# Patient Record
Sex: Female | Born: 1960 | Race: White | Hispanic: No | Marital: Married | Smoking: Never smoker
Health system: Southern US, Community
[De-identification: ages and names within clinical notes are randomized; demographics above are authoritative.]

## PROBLEM LIST (undated history)

## (undated) DIAGNOSIS — R42 Dizziness and giddiness: Secondary | ICD-10-CM

## (undated) DIAGNOSIS — F419 Anxiety disorder, unspecified: Secondary | ICD-10-CM

## (undated) DIAGNOSIS — E119 Type 2 diabetes mellitus without complications: Secondary | ICD-10-CM

## (undated) DIAGNOSIS — E785 Hyperlipidemia, unspecified: Secondary | ICD-10-CM

## (undated) DIAGNOSIS — T7840XA Allergy, unspecified, initial encounter: Secondary | ICD-10-CM

## (undated) DIAGNOSIS — F329 Major depressive disorder, single episode, unspecified: Secondary | ICD-10-CM

## (undated) DIAGNOSIS — F32A Depression, unspecified: Secondary | ICD-10-CM

## (undated) DIAGNOSIS — I10 Essential (primary) hypertension: Secondary | ICD-10-CM

## (undated) DIAGNOSIS — J309 Allergic rhinitis, unspecified: Secondary | ICD-10-CM

## (undated) DIAGNOSIS — L259 Unspecified contact dermatitis, unspecified cause: Secondary | ICD-10-CM

## (undated) DIAGNOSIS — Z0000 Encounter for general adult medical examination without abnormal findings: Secondary | ICD-10-CM

## (undated) DIAGNOSIS — G4730 Sleep apnea, unspecified: Secondary | ICD-10-CM

## (undated) DIAGNOSIS — M722 Plantar fascial fibromatosis: Secondary | ICD-10-CM

## (undated) HISTORY — DX: Dizziness and giddiness: R42

## (undated) HISTORY — DX: Anxiety disorder, unspecified: F41.9

## (undated) HISTORY — DX: Depression, unspecified: F32.A

## (undated) HISTORY — DX: Encounter for general adult medical examination without abnormal findings: Z00.00

## (undated) HISTORY — DX: Type 2 diabetes mellitus without complications: E11.9

## (undated) HISTORY — DX: Essential (primary) hypertension: I10

## (undated) HISTORY — PX: MOUTH SURGERY: SHX715

## (undated) HISTORY — PX: TUBAL LIGATION: SHX77

## (undated) HISTORY — DX: Allergy, unspecified, initial encounter: T78.40XA

## (undated) HISTORY — DX: Hyperlipidemia, unspecified: E78.5

## (undated) HISTORY — DX: Allergic rhinitis, unspecified: J30.9

## (undated) HISTORY — DX: Plantar fascial fibromatosis: M72.2

## (undated) HISTORY — DX: Sleep apnea, unspecified: G47.30

## (undated) HISTORY — DX: Unspecified contact dermatitis, unspecified cause: L25.9

## (undated) HISTORY — PX: WISDOM TOOTH EXTRACTION: SHX21

---
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
## 1999-09-04 ENCOUNTER — Encounter (INDEPENDENT_AMBULATORY_CARE_PROVIDER_SITE_OTHER): Payer: Self-pay | Admitting: Specialist

## 1999-09-04 ENCOUNTER — Other Ambulatory Visit: Admission: RE | Admit: 1999-09-04 | Discharge: 1999-09-04 | Payer: Self-pay | Admitting: Gynecology

## 1999-09-06 ENCOUNTER — Ambulatory Visit (HOSPITAL_COMMUNITY): Admission: RE | Admit: 1999-09-06 | Discharge: 1999-09-06 | Payer: Self-pay | Admitting: Gynecology

## 1999-09-06 ENCOUNTER — Encounter (INDEPENDENT_AMBULATORY_CARE_PROVIDER_SITE_OTHER): Payer: Self-pay

## 2003-09-09 ENCOUNTER — Other Ambulatory Visit: Admission: RE | Admit: 2003-09-09 | Discharge: 2003-09-09 | Payer: Self-pay | Admitting: Gynecology

## 2003-09-28 ENCOUNTER — Encounter: Admission: RE | Admit: 2003-09-28 | Discharge: 2003-12-27 | Payer: Self-pay | Admitting: Internal Medicine

## 2004-05-13 ENCOUNTER — Ambulatory Visit: Payer: Self-pay | Admitting: Family Medicine

## 2005-01-19 ENCOUNTER — Other Ambulatory Visit: Admission: RE | Admit: 2005-01-19 | Discharge: 2005-01-19 | Payer: Self-pay | Admitting: Gynecology

## 2005-06-19 ENCOUNTER — Ambulatory Visit: Payer: Self-pay | Admitting: Internal Medicine

## 2005-11-19 ENCOUNTER — Ambulatory Visit: Payer: Self-pay | Admitting: Internal Medicine

## 2006-01-22 ENCOUNTER — Other Ambulatory Visit: Admission: RE | Admit: 2006-01-22 | Discharge: 2006-01-22 | Payer: Self-pay | Admitting: Gynecology

## 2006-02-28 ENCOUNTER — Ambulatory Visit: Payer: Self-pay | Admitting: Endocrinology

## 2006-10-03 ENCOUNTER — Encounter: Payer: Self-pay | Admitting: *Deleted

## 2006-11-29 ENCOUNTER — Telehealth (INDEPENDENT_AMBULATORY_CARE_PROVIDER_SITE_OTHER): Payer: Self-pay | Admitting: *Deleted

## 2006-11-29 ENCOUNTER — Ambulatory Visit: Payer: Self-pay | Admitting: Internal Medicine

## 2006-11-29 DIAGNOSIS — E119 Type 2 diabetes mellitus without complications: Secondary | ICD-10-CM

## 2006-11-29 HISTORY — DX: Type 2 diabetes mellitus without complications: E11.9

## 2007-01-03 ENCOUNTER — Ambulatory Visit: Payer: Self-pay | Admitting: Internal Medicine

## 2007-01-04 LAB — CONVERTED CEMR LAB
AST: 23 units/L (ref 0–37)
Albumin: 3.5 g/dL (ref 3.5–5.2)
Bacteria, UA: NEGATIVE
Basophils Absolute: 0 10*3/uL (ref 0.0–0.1)
Bilirubin, Direct: 0.1 mg/dL (ref 0.0–0.3)
CO2: 25 meq/L (ref 19–32)
Chloride: 105 meq/L (ref 96–112)
Cholesterol: 143 mg/dL (ref 0–200)
Creatinine, Ser: 0.7 mg/dL (ref 0.4–1.2)
Crystals: NEGATIVE
Eosinophils Relative: 3.2 % (ref 0.0–5.0)
Glucose, Bld: 163 mg/dL — ABNORMAL HIGH (ref 70–99)
HCT: 38.6 % (ref 36.0–46.0)
HDL: 30.7 mg/dL — ABNORMAL LOW (ref >39.0)
Hemoglobin: 13.2 g/dL (ref 12.0–15.0)
Hgb A1c MFr Bld: 6.6 % — ABNORMAL HIGH (ref 4.6–6.0)
Ketones, ur: NEGATIVE mg/dL
Leukocytes, UA: NEGATIVE
MCHC: 34.1 g/dL (ref 30.0–36.0)
Monocytes Absolute: 0.3 10*3/uL (ref 0.2–0.7)
Mucus, UA: NEGATIVE
Neutrophils Relative %: 61.6 % (ref 43.0–77.0)
Nitrite: NEGATIVE
Potassium: 3.9 meq/L (ref 3.5–5.1)
RBC: 4.45 M/uL (ref 3.87–5.11)
RDW: 12.1 % (ref 11.5–14.6)
Sodium: 139 meq/L (ref 135–145)
TSH: 1.37 microintl units/mL (ref 0.35–5.50)
Total Bilirubin: 0.4 mg/dL (ref 0.3–1.2)
Total Protein: 6.9 g/dL (ref 6.0–8.3)
Urobilinogen, UA: 0.2 (ref 0.0–1.0)
VLDL: 23 mg/dL (ref 0–40)
WBC: 6.4 10*3/uL (ref 4.5–10.5)
pH: 5 (ref 5.0–8.0)

## 2007-01-07 ENCOUNTER — Ambulatory Visit: Payer: Self-pay | Admitting: Internal Medicine

## 2007-01-07 DIAGNOSIS — E785 Hyperlipidemia, unspecified: Secondary | ICD-10-CM

## 2007-01-07 HISTORY — DX: Hyperlipidemia, unspecified: E78.5

## 2007-01-24 ENCOUNTER — Other Ambulatory Visit: Admission: RE | Admit: 2007-01-24 | Discharge: 2007-01-24 | Payer: Self-pay | Admitting: Gynecology

## 2007-01-28 ENCOUNTER — Encounter: Payer: Self-pay | Admitting: Internal Medicine

## 2007-03-24 ENCOUNTER — Telehealth: Payer: Self-pay | Admitting: Internal Medicine

## 2007-04-26 ENCOUNTER — Ambulatory Visit: Payer: Self-pay | Admitting: Family Medicine

## 2007-04-26 DIAGNOSIS — R42 Dizziness and giddiness: Secondary | ICD-10-CM

## 2007-04-26 HISTORY — DX: Dizziness and giddiness: R42

## 2007-06-24 ENCOUNTER — Telehealth: Payer: Self-pay | Admitting: Internal Medicine

## 2007-06-27 ENCOUNTER — Ambulatory Visit: Payer: Self-pay | Admitting: Internal Medicine

## 2007-06-27 LAB — CONVERTED CEMR LAB
CO2: 23 meq/L (ref 19–32)
Calcium: 9.2 mg/dL (ref 8.4–10.5)
GFR calc Af Amer: 116 mL/min
HDL: 32.2 mg/dL — ABNORMAL LOW (ref >39.0)
Hgb A1c MFr Bld: 6.3 % — ABNORMAL HIGH (ref 4.6–6.0)
Potassium: 4.1 meq/L (ref 3.5–5.1)
Sodium: 138 meq/L (ref 135–145)
Total CHOL/HDL Ratio: 4.3

## 2007-07-08 ENCOUNTER — Ambulatory Visit: Payer: Self-pay | Admitting: Internal Medicine

## 2007-07-08 DIAGNOSIS — I10 Essential (primary) hypertension: Secondary | ICD-10-CM

## 2007-07-08 HISTORY — DX: Essential (primary) hypertension: I10

## 2007-08-05 ENCOUNTER — Ambulatory Visit: Payer: Self-pay | Admitting: Internal Medicine

## 2007-08-05 DIAGNOSIS — L989 Disorder of the skin and subcutaneous tissue, unspecified: Secondary | ICD-10-CM | POA: Insufficient documentation

## 2007-08-05 DIAGNOSIS — M722 Plantar fascial fibromatosis: Secondary | ICD-10-CM

## 2007-08-05 DIAGNOSIS — J019 Acute sinusitis, unspecified: Secondary | ICD-10-CM

## 2007-08-05 HISTORY — DX: Plantar fascial fibromatosis: M72.2

## 2007-09-26 ENCOUNTER — Ambulatory Visit: Payer: Self-pay | Admitting: Internal Medicine

## 2007-09-26 DIAGNOSIS — H6690 Otitis media, unspecified, unspecified ear: Secondary | ICD-10-CM | POA: Insufficient documentation

## 2007-12-30 ENCOUNTER — Ambulatory Visit: Payer: Self-pay | Admitting: Internal Medicine

## 2007-12-30 LAB — CONVERTED CEMR LAB
AST: 36 units/L (ref 0–37)
Albumin: 3.7 g/dL (ref 3.5–5.2)
BUN: 6 mg/dL (ref 6–23)
Basophils Relative: 0.4 % (ref 0.0–3.0)
Calcium: 8.9 mg/dL (ref 8.4–10.5)
Chloride: 108 meq/L (ref 96–112)
Cholesterol: 163 mg/dL (ref 0–200)
Creatinine, Ser: 0.6 mg/dL (ref 0.4–1.2)
Creatinine,U: 131.9 mg/dL
Crystals: NEGATIVE
Eosinophils Absolute: 0.2 10*3/uL (ref 0.0–0.7)
Eosinophils Relative: 2.5 % (ref 0.0–5.0)
GFR calc Af Amer: 138 mL/min
GFR calc non Af Amer: 114 mL/min
HCT: 37.8 % (ref 36.0–46.0)
Hgb A1c MFr Bld: 6.5 % — ABNORMAL HIGH (ref 4.6–6.0)
Ketones, ur: NEGATIVE mg/dL
MCHC: 34 g/dL (ref 30.0–36.0)
MCV: 88.2 fL (ref 78.0–100.0)
Monocytes Absolute: 0.3 10*3/uL (ref 0.1–1.0)
Neutro Abs: 5 10*3/uL (ref 1.4–7.7)
RBC: 4.29 M/uL (ref 3.87–5.11)
Specific Gravity, Urine: 1.025 (ref 1.000–1.03)
Total Protein, Urine: NEGATIVE mg/dL
Triglycerides: 115 mg/dL (ref 0–149)
Urine Glucose: NEGATIVE mg/dL
VLDL: 23 mg/dL (ref 0–40)
WBC: 7.6 10*3/uL (ref 4.5–10.5)

## 2008-01-06 ENCOUNTER — Ambulatory Visit: Payer: Self-pay | Admitting: Internal Medicine

## 2008-02-04 ENCOUNTER — Other Ambulatory Visit: Admission: RE | Admit: 2008-02-04 | Discharge: 2008-02-04 | Payer: Self-pay | Admitting: Gynecology

## 2008-02-04 ENCOUNTER — Ambulatory Visit: Payer: Self-pay | Admitting: Gynecology

## 2008-02-04 ENCOUNTER — Encounter: Payer: Self-pay | Admitting: Gynecology

## 2008-02-05 ENCOUNTER — Encounter: Payer: Self-pay | Admitting: Internal Medicine

## 2008-06-30 ENCOUNTER — Ambulatory Visit: Payer: Self-pay | Admitting: Internal Medicine

## 2008-06-30 LAB — CONVERTED CEMR LAB
BUN: 8 mg/dL (ref 6–23)
CO2: 26 meq/L (ref 19–32)
Chloride: 106 meq/L (ref 96–112)
Creatinine, Ser: 0.6 mg/dL (ref 0.4–1.2)
Glucose, Bld: 167 mg/dL — ABNORMAL HIGH (ref 70–99)
LDL Cholesterol: 66 mg/dL (ref 0–99)
Total CHOL/HDL Ratio: 3

## 2008-07-05 ENCOUNTER — Telehealth: Payer: Self-pay | Admitting: Internal Medicine

## 2008-07-07 ENCOUNTER — Ambulatory Visit: Payer: Self-pay | Admitting: Internal Medicine

## 2008-07-07 DIAGNOSIS — J309 Allergic rhinitis, unspecified: Secondary | ICD-10-CM | POA: Insufficient documentation

## 2008-07-07 HISTORY — DX: Allergic rhinitis, unspecified: J30.9

## 2008-08-27 ENCOUNTER — Encounter: Payer: Self-pay | Admitting: Internal Medicine

## 2008-09-21 ENCOUNTER — Ambulatory Visit: Payer: Self-pay | Admitting: Internal Medicine

## 2008-09-21 DIAGNOSIS — L03319 Cellulitis of trunk, unspecified: Secondary | ICD-10-CM

## 2008-09-21 DIAGNOSIS — J069 Acute upper respiratory infection, unspecified: Secondary | ICD-10-CM | POA: Insufficient documentation

## 2008-09-21 DIAGNOSIS — L02219 Cutaneous abscess of trunk, unspecified: Secondary | ICD-10-CM

## 2008-12-30 ENCOUNTER — Ambulatory Visit: Payer: Self-pay | Admitting: Internal Medicine

## 2008-12-30 LAB — CONVERTED CEMR LAB
ALT: 67 units/L — ABNORMAL HIGH (ref 0–35)
AST: 54 units/L — ABNORMAL HIGH (ref 0–37)
Alkaline Phosphatase: 52 units/L (ref 39–117)
Bilirubin Urine: NEGATIVE
Calcium: 9.2 mg/dL (ref 8.4–10.5)
Eosinophils Relative: 2.3 % (ref 0.0–5.0)
GFR calc non Af Amer: 95.05 mL/min (ref >60)
HCT: 38.7 % (ref 36.0–46.0)
HDL: 37.5 mg/dL — ABNORMAL LOW (ref >39.00)
Hemoglobin, Urine: NEGATIVE
Hemoglobin: 13.1 g/dL (ref 12.0–15.0)
Hgb A1c MFr Bld: 7.5 % — ABNORMAL HIGH (ref 4.6–6.5)
LDL Cholesterol: 74 mg/dL (ref 0–99)
Leukocytes, UA: NEGATIVE
Lymphocytes Relative: 29.6 % (ref 12.0–46.0)
Lymphs Abs: 1.7 10*3/uL (ref 0.7–4.0)
Monocytes Relative: 5.7 % (ref 3.0–12.0)
Neutro Abs: 3.6 10*3/uL (ref 1.4–7.7)
Nitrite: NEGATIVE
Platelets: 237 10*3/uL (ref 150.0–400.0)
Potassium: 3.9 meq/L (ref 3.5–5.1)
Sodium: 138 meq/L (ref 135–145)
TSH: 1.1 microintl units/mL (ref 0.35–5.50)
Total Bilirubin: 0.7 mg/dL (ref 0.3–1.2)
Total CHOL/HDL Ratio: 3
Total Protein, Urine: NEGATIVE mg/dL
Urobilinogen, UA: 0.2 (ref 0.0–1.0)
VLDL: 19 mg/dL (ref 0.0–40.0)
WBC: 5.7 10*3/uL (ref 4.5–10.5)

## 2009-01-11 ENCOUNTER — Ambulatory Visit: Payer: Self-pay | Admitting: Internal Medicine

## 2009-02-07 ENCOUNTER — Encounter: Payer: Self-pay | Admitting: Internal Medicine

## 2009-02-23 ENCOUNTER — Encounter: Admission: RE | Admit: 2009-02-23 | Discharge: 2009-02-23 | Payer: Self-pay | Admitting: Internal Medicine

## 2009-02-24 ENCOUNTER — Encounter: Payer: Self-pay | Admitting: Internal Medicine

## 2009-07-04 ENCOUNTER — Ambulatory Visit: Payer: Self-pay | Admitting: Internal Medicine

## 2009-07-04 LAB — CONVERTED CEMR LAB
BUN: 7 mg/dL (ref 6–23)
CO2: 26 meq/L (ref 19–32)
Calcium: 8.8 mg/dL (ref 8.4–10.5)
Chloride: 106 meq/L (ref 96–112)
Creatinine, Ser: 0.6 mg/dL (ref 0.4–1.2)
Glucose, Bld: 168 mg/dL — ABNORMAL HIGH (ref 70–99)
Hgb A1c MFr Bld: 6.7 % — ABNORMAL HIGH (ref 4.6–6.5)
LDL Cholesterol: 69 mg/dL (ref 0–99)
Total CHOL/HDL Ratio: 4
Triglycerides: 146 mg/dL (ref 0.0–149.0)

## 2009-07-13 ENCOUNTER — Ambulatory Visit: Payer: Self-pay | Admitting: Internal Medicine

## 2009-07-13 ENCOUNTER — Telehealth: Payer: Self-pay | Admitting: Internal Medicine

## 2009-07-29 ENCOUNTER — Telehealth: Payer: Self-pay | Admitting: Internal Medicine

## 2009-11-30 ENCOUNTER — Ambulatory Visit: Payer: Self-pay | Admitting: Internal Medicine

## 2009-11-30 DIAGNOSIS — L259 Unspecified contact dermatitis, unspecified cause: Secondary | ICD-10-CM

## 2009-11-30 HISTORY — DX: Unspecified contact dermatitis, unspecified cause: L25.9

## 2010-01-31 ENCOUNTER — Ambulatory Visit
Admission: RE | Admit: 2010-01-31 | Discharge: 2010-01-31 | Payer: Self-pay | Source: Home / Self Care | Attending: Internal Medicine | Admitting: Internal Medicine

## 2010-01-31 ENCOUNTER — Other Ambulatory Visit: Payer: Self-pay | Admitting: Internal Medicine

## 2010-01-31 LAB — MICROALBUMIN / CREATININE URINE RATIO
Creatinine,U: 108 mg/dL
Microalb, Ur: 2.7 mg/dL — ABNORMAL HIGH (ref 0.0–1.9)

## 2010-01-31 LAB — URINALYSIS
Ketones, ur: NEGATIVE
Specific Gravity, Urine: 1.025 (ref 1.000–1.030)
Total Protein, Urine: NEGATIVE
Urine Glucose: NEGATIVE
pH: 5 (ref 5.0–8.0)

## 2010-01-31 LAB — HEMOGLOBIN A1C: Hgb A1c MFr Bld: 6.8 % — ABNORMAL HIGH (ref 4.6–6.5)

## 2010-01-31 LAB — CBC WITH DIFFERENTIAL/PLATELET
Basophils Absolute: 0 10*3/uL (ref 0.0–0.1)
Basophils Relative: 0.4 % (ref 0.0–3.0)
HCT: 39.9 % (ref 36.0–46.0)
Hemoglobin: 13.6 g/dL (ref 12.0–15.0)
Lymphs Abs: 1.9 10*3/uL (ref 0.7–4.0)
Monocytes Relative: 5.1 % (ref 3.0–12.0)
Neutro Abs: 4.5 10*3/uL (ref 1.4–7.7)
RDW: 14 % (ref 11.5–14.6)

## 2010-01-31 LAB — HEPATIC FUNCTION PANEL
Albumin: 3.9 g/dL (ref 3.5–5.2)
Bilirubin, Direct: 0 mg/dL (ref 0.0–0.3)
Total Protein: 6.9 g/dL (ref 6.0–8.3)

## 2010-01-31 LAB — BASIC METABOLIC PANEL
BUN: 9 mg/dL (ref 6–23)
CO2: 27 mEq/L (ref 19–32)
Calcium: 9.3 mg/dL (ref 8.4–10.5)
Creatinine, Ser: 0.6 mg/dL (ref 0.4–1.2)
Glucose, Bld: 131 mg/dL — ABNORMAL HIGH (ref 70–99)

## 2010-01-31 LAB — TSH: TSH: 1.53 u[IU]/mL (ref 0.35–5.50)

## 2010-01-31 LAB — LIPID PANEL
Cholesterol: 144 mg/dL (ref 0–200)
Triglycerides: 89 mg/dL (ref 0.0–149.0)

## 2010-02-07 ENCOUNTER — Encounter: Payer: Self-pay | Admitting: Internal Medicine

## 2010-02-07 ENCOUNTER — Ambulatory Visit
Admission: RE | Admit: 2010-02-07 | Discharge: 2010-02-07 | Payer: Self-pay | Source: Home / Self Care | Attending: Internal Medicine | Admitting: Internal Medicine

## 2010-02-09 NOTE — Progress Notes (Signed)
  Phone Note Refill Request  on July 13, 2009 5:04 PM  Refills Requested: Medication #1:  ONETOUCH ULTRA TEST   STRP use asd two times a day   Dosage confirmed as above?Dosage Confirmed   Notes: [facility] [place]  Medication #2:  Biagio Borg LANCETS   MISC 1 by mouth two times a day   Dosage confirmed as above?Dosage Confirmed   Notes: [facility] [place] Initial call taken by: Zella Ball Ewing CMA Duncan Dull),  July 13, 2009 5:04 PM    Prescriptions: ONETOUCH ULTRASOFT LANCETS   MISC (LANCETS) 1 by mouth two times a day  #200 x 11   Entered by:   Scharlene Gloss CMA (AAMA)   Authorized by:   Corwin Levins MD   Signed by:   Scharlene Gloss CMA (AAMA) on 07/13/2009   Method used:   Electronically to        [facility] [address] (retail)       [address]       [place], [place]  [address]       Ph: [phone]       Fax: [phone]   RxID:   [id number] [address] TEST   STRP (GLUCOSE BLOOD) use asd two times a day  #200 x 11   Entered by:   Zella Ball Ewing CMA (AAMA)   Authorized by:   Corwin Levins MD   Signed by:   Scharlene Gloss CMA (AAMA) on 07/13/2009   Method used:   Electronically to        [facility] [address] (retail)       [address]       [place], [place]  [address]       Ph: [phone]       Fax: [phone]   RxID:   [id number]

## 2010-02-09 NOTE — Progress Notes (Signed)
  Phone Note Refill Request Message from:  Fax from Pharmacy on July 29, 2009 8:18 AM  Refills Requested: Medication #1:  LISINOPRIL 5 MG  TABS 1 by mouth once daily   Dosage confirmed as above?Dosage Confirmed   Notes: [facility]  Medication #2:  PRAVACHOL 40 MG TABS 1 by mouth once daily   Dosage confirmed as above?Dosage Confirmed   Notes: [facility] Initial call taken by: Zella Ball Ewing CMA (AAMA),  July 29, 2009 8:18 AM    Prescriptions: PRAVACHOL 40 MG TABS (PRAVASTATIN SODIUM) 1 by mouth once daily  #90 x 3   Entered by:   Zella Ball Ewing CMA (AAMA)   Authorized by:   Corwin Levins MD   Signed by:   Scharlene Gloss CMA (AAMA) on 07/29/2009   Method used:   Faxed to ...       [facility] [address]  [phone]* (retail)       [address]       [place], [place]  [address]       Ph: [phone] or [phone]       Fax: [phone]   RxID:   [id number] LISINOPRIL 5 MG  TABS (LISINOPRIL) 1 by mouth once daily  #90 x 3   Entered by:   Scharlene Gloss CMA (AAMA)   Authorized by:   Corwin Levins MD   Signed by:   Scharlene Gloss CMA (AAMA) on 07/29/2009   Method used:   Faxed to ...       [facility] [address]  [phone]* (retail)       [address]       [place], [place]  [address]       Ph: [phone] or [phone]       Fax: [phone]   RxID:   [id number]

## 2010-02-09 NOTE — Letter (Signed)
[facility]   Imported By: Lester Sutton 02/28/2009 10:05:53  _____________________________________________________________________  External Attachment:    Type:   Image     Comment:   External Document

## 2010-02-09 NOTE — Assessment & Plan Note (Signed)
Summary: 6 MOS F/U $50 CD   Vital Signs:  Patient profile:   50 year old female Height:      62 inches Weight:      178 pounds BMI:     32.67 O2 Sat:      98 % on Room air Temp:     97.6 degrees F oral Pulse rate:   89 / minute BP sitting:   120 / 78  (left arm) Cuff size:   regular  Vitals Entered ByZella Ball Ewing (January 11, 2009 4:12 PM)  O2 Flow:  Room air  CC: 6 Mo followup/RE   CC:  6 Mo followup/RE.  History of Present Illness: here with  bilat earache , very mild with occasional vertigo wtih head movement, but no severe pain, fever, chills and ongoing for over 3 wks despite mucinex as needed;  Pt denies CP, sob, doe, wheezing, orthopnea, pnd, worsening LE edema, palps, dizziness or syncope  Pt denies new neuro symptoms such as headache, facial or extremity weakness   Pt denies polydipsia, polyuria, or low sugar symptoms such as shakiness improved with eating.  Overall good compliance with meds, trying to follow low chol, DM diet, wt stable, little excercise however   Problems Prior to Update: 1)  Preventive Health Care  (ICD-V70.0) 2)  Uri  (ICD-465.9) 3)  Cellulitis and Abscess of Trunk  (ICD-682.2) 4)  Allergic Rhinitis  (ICD-477.9) 5)  Sinusitis- Acute-nos  (ICD-461.9) 6)  Preventive Health Care  (ICD-V70.0) 7)  Intermittent Vertigo  (ICD-780.4) 8)  Otitis Media, Acute, Bilateral  (ICD-382.9) 9)  Plantar Fasciitis, Left  (ICD-728.71) 10)  Skin Lesion  (ICD-709.9) 11)  Sinusitis- Acute-nos  (ICD-461.9) 12)  Hypertension  (ICD-401.9) 13)  Vertigo  (ICD-780.4) 14)  Hyperlipidemia  (ICD-272.4) 15)  Preventive Health Care  (ICD-V70.0) 16)  Routine General Medical Exam@health  Care Fac[facility]  (ICD-V70.0) 17)  Diabetes Mellitus, Type II  (ICD-250.00)  Medications Prior to Update: 1)  Metformin Hcl 500 Mg  Tabs (Metformin Hcl) .... 2 By Mouth Bid 2)  Ecotrin Low Strength 81 Mg  Tbec (Aspirin) .Marland Kitchen.. 1 By Mouth Qd 3)  Onetouch Ultra Test   Strp (Glucose Blood) .... Use Asd  Two Times A Day 4)  Glimepiride 1 Mg  Tabs (Glimepiride) .... 1/2 By Mouth Once Daily At Noon 5)  Onetouch Ultrasoft Lancets   Misc (Lancets) .Marland Kitchen.. 1 By Mouth Two Times A Day 6)  Lisinopril 5 Mg  Tabs (Lisinopril) .Marland Kitchen.. 1 By Mouth Once Daily 7)  Pravachol 40 Mg Tabs (Pravastatin Sodium) .Marland Kitchen.. 1 By Mouth Once Daily 8)  Fluticasone Propionate 50 Mcg/act Susp (Fluticasone Propionate) .... 2 Spray/side Once Daily 9)  Doxycycline Hyclate 100 Mg Caps (Doxycycline Hyclate) .Marland Kitchen.. 1 By Mouth Two Times A Day  Current Medications (verified): 1)  Metformin Hcl 500 Mg  Tabs (Metformin Hcl) .... 2 By Mouth Two Times A Day 2)  Ecotrin Low Strength 81 Mg  Tbec (Aspirin) .Marland Kitchen.. 1 By Mouth Qd 3)  Onetouch Ultra Test   Strp (Glucose Blood) .... Use Asd Two Times A Day 4)  Glimepiride 1 Mg  Tabs (Glimepiride) .Marland Kitchen.. 1 By Mouth Once Daily At Noon 5)  Onetouch Ultrasoft Lancets   Misc (Lancets) .Marland Kitchen.. 1 By Mouth Two Times A Day 6)  Lisinopril 5 Mg  Tabs (Lisinopril) .Marland Kitchen.. 1 By Mouth Once Daily 7)  Pravachol 40 Mg Tabs (Pravastatin Sodium) .Marland Kitchen.. 1 By Mouth Once Daily 8)  Fluticasone Propionate 50 Mcg/act Susp (Fluticasone Propionate) .Marland KitchenMarland KitchenMarland Kitchen  2 Spray/side Once Daily 9)  Fexofenadine Hcl 180 Mg Tabs (Fexofenadine Hcl) .Marland Kitchen.. 1po Once Daily As Needed Allergies  Allergies (verified): 1)  ! Penicillin  Past History:  Past Medical History: Last updated: 07/07/2008 Diabetes mellitus, type II uterine polyps Hypertension Allergic rhinitis  Past Surgical History: Last updated: 11/29/2006 Tubal ligation  Family History: Last updated: 01/11/2009 mother with MI, chf, stroke, lung cancer, breast cancer father with prostate cancer, CAD, DM. stroke x 3 brother died with aneurysm  Social History: Last updated: 08/05/2007 Never Smoked Alcohol use-yes Married 2 children work - Print production planner  Risk Factors: Smoking Status: never (11/29/2006)  Family History: Reviewed history from 08/05/2007 and no changes required. mother  with MI, chf, stroke, lung cancer, breast cancer father with prostate cancer, CAD, DM. stroke x 3 brother died with aneurysm  Review of Systems  The patient denies anorexia, fever, weight loss, weight gain, vision loss, decreased hearing, hoarseness, chest pain, syncope, dyspnea on exertion, peripheral edema, prolonged cough, headaches, hemoptysis, abdominal pain, melena, hematochezia, severe indigestion/heartburn, hematuria, incontinence, muscle weakness, suspicious skin lesions, transient blindness, difficulty walking, depression, unusual weight change, abnormal bleeding, enlarged lymph nodes, and angioedema.         all otherwise negative per pt -  Physical Exam  General:  alert and overweight-appearing.   Head:  normocephalic and atraumatic.   Eyes:  vision grossly intact, pupils equal, and pupils round.   Ears:  bilat tm's mild erythema at best Nose:  no external deformity and no nasal discharge.   Mouth:  no gingival abnormalities and pharynx pink and moist.   Neck:  supple and no masses.   Lungs:  normal respiratory effort and normal breath sounds.   Heart:  normal rate and regular rhythm.   Abdomen:  soft, non-tender, and normal bowel sounds.   Msk:  no joint tenderness and no joint swelling.   Extremities:  no edema, no erythema  Neurologic:  cranial nerves II-XII intact and strength normal in all extremities.     Impression & Recommendations:  Problem # 1:  Preventive Health Care (ICD-V70.0) Overall doing well, up to date, counseled on routine health concerns for screening and prevention, immunizations up to date or declined, labs reviewed  Problem # 2:  DIABETES MELLITUS, TYPE II (ICD-250.00)  Her updated medication list for this problem includes:    Metformin Hcl 500 Mg Tabs (Metformin hcl) .Marland Kitchen... 2 by mouth two times a day    Ecotrin Low Strength 81 Mg Tbec (Aspirin) .Marland Kitchen... 1 by mouth qd    Glimepiride 1 Mg Tabs (Glimepiride) .Marland Kitchen... 1 by mouth once daily at noon     Lisinopril 5 Mg Tabs (Lisinopril) .Marland Kitchen... 1 by mouth once daily to icnrease the OHA above, refer to dietary,  Orders: Diabetic Clinic Referral (Diabetic)  Problem # 3:  ALLERGIC RHINITIS (ICD-477.9)  Her updated medication list for this problem includes:    Fluticasone Propionate 50 Mcg/act Susp (Fluticasone propionate) .Marland Kitchen... 2 spray/side once daily    Fexofenadine Hcl 180 Mg Tabs (Fexofenadine hcl) .Marland Kitchen... 1po once daily as needed allergies most likely related to the ear complaints - treat as above, f/u any worsening signs or symptoms   Complete Medication List: 1)  Metformin Hcl 500 Mg Tabs (Metformin hcl) .... 2 by mouth two times a day 2)  Ecotrin Low Strength 81 Mg Tbec (Aspirin) .Marland Kitchen.. 1 by mouth qd 3)  Onetouch Ultra Test Strp (Glucose blood) .... Use asd two times a day 4)  Glimepiride 1  Mg Tabs (Glimepiride) .Marland Kitchen.. 1 by mouth once daily at noon 5)  Onetouch Ultrasoft Lancets Misc (Lancets) .Marland Kitchen.. 1 by mouth two times a day 6)  Lisinopril 5 Mg Tabs (Lisinopril) .Marland Kitchen.. 1 by mouth once daily 7)  Pravachol 40 Mg Tabs (Pravastatin sodium) .Marland Kitchen.. 1 by mouth once daily 8)  Fluticasone Propionate 50 Mcg/act Susp (Fluticasone propionate) .... 2 spray/side once daily 9)  Fexofenadine Hcl 180 Mg Tabs (Fexofenadine hcl) .Marland Kitchen.. 1po once daily as needed allergies  Other Orders: Admin 1st Vaccine (60454) Flu Vaccine 52yrs + ([address]11)  Patient Instructions: 1)  you had the flu shot today 2)   incrsae the glimeparide to 1 mg per day 3)  start the generic allegra 1 per day 4)  Continue all previous medications as before this visit  5)  Please schedule a follow-up appointment in 6 months with : 6)  BMP prior to visit, ICD-9: 250.02 7)  Lipid Panel prior to visit, ICD-9: 8)  HbgA1C prior to visit, ICD-9: Prescriptions: PRAVACHOL 40 MG TABS (PRAVASTATIN SODIUM) 1 by mouth once daily  #90 x 3   Entered and Authorized by:   Corwin Levins MD   Signed by:   Corwin Levins MD on 01/11/2009   Method used:   Print  then Give to Patient   RxID:   [id number] LISINOPRIL 5 MG  TABS (LISINOPRIL) 1 by mouth once daily  #90 x 3   Entered and Authorized by:   Corwin Levins MD   Signed by:   Corwin Levins MD on 01/11/2009   Method used:   Print then Give to Patient   RxID:   [id number] FEXOFENADINE HCL 180 MG TABS (FEXOFENADINE HCL) 1po once daily as needed allergies  #90 x 3   Entered and Authorized by:   Corwin Levins MD   Signed by:   Corwin Levins MD on 01/11/2009   Method used:   Print then Give to Patient   RxID:   [id number] FLUTICASONE PROPIONATE 50 MCG/ACT SUSP (FLUTICASONE PROPIONATE) 2 spray/side once daily  #1 x 11   Entered and Authorized by:   Corwin Levins MD   Signed by:   Corwin Levins MD on 01/11/2009   Method used:   Print then Give to Patient   RxID:   [id number] METFORMIN HCL 500 MG  TABS (METFORMIN HCL) 2 by mouth two times a day  #120 x 11   Entered and Authorized by:   Corwin Levins MD   Signed by:   Corwin Levins MD on 01/11/2009   Method used:   Print then Give to Patient   RxID:   [id number] GLIMEPIRIDE 1 MG  TABS (GLIMEPIRIDE) 1 by mouth once daily at noon  #90 x 3   Entered and Authorized by:   Corwin Levins MD   Signed by:   Corwin Levins MD on 01/11/2009   Method used:   Print then Give to Patient   RxID:   [id number] GLIMEPIRIDE 1 MG  TABS (GLIMEPIRIDE) 1 by mouth once daily at noon  #90 x 3   Entered and Authorized by:   Corwin Levins MD   Signed by:   Corwin Levins MD on 01/11/2009   Method used:   Electronically to        [facility]  [phone]* (retail)       [address] [place]  [place], [place]  [address]       Ph: [phone] or [phone]       Fax: [phone]   RxID:   [id number]     Flu Vaccine Consent Questions     Do you have a history of severe allergic reactions to this vaccine? no    Any prior history of allergic reactions to egg and/or gelatin? no    Do you have a sensitivity to  the preservative Thimersol? no    Do you have a past history of Guillan-Barre Syndrome? no    Do you currently have an acute febrile illness? no    Have you ever had a severe reaction to latex? no    Vaccine information given and explained to patient? yes    Are you currently pregnant? no    Lot Number:[id number]   Exp Date:07/07/2009   Site Given  Left Deltoid IMflu

## 2010-02-09 NOTE — Progress Notes (Signed)
  Phone Note Refill Request Message from:  Patient on July 29, 2009 10:06 AM  Refills Requested: Medication #1:  ONETOUCH ULTRA TEST   STRP use asd two times a day   Dosage confirmed as above?Dosage Confirmed   Notes: [facility] [place]  Medication #2:  Biagio Borg LANCETS   MISC 1 by mouth two times a day   Dosage confirmed as above?Dosage Confirmed   Notes: [facility] [place] Initial call taken by: Zella Ball Ewing CMA [place]),  July 29, 2009 10:06 AM    Prescriptions: Biagio Borg LANCETS   MISC (LANCETS) 1 by mouth two times a day  #200 x 11   Entered by:   Zella Ball Ewing CMA (AAMA)   Authorized by:   Corwin Levins MD   Signed by:   Scharlene Gloss CMA (AAMA) on 07/29/2009   Method used:   Faxed to ...       [facility] (retail)       [address]       [place], [place]  [address]       Ph: [phone]       Fax: [phone]   RxID:   [id number] [address] TEST   STRP (GLUCOSE BLOOD) use asd two times a day  #200 x 11   Entered by:   Zella Ball Ewing CMA (AAMA)   Authorized by:   Corwin Levins MD   Signed by:   Scharlene Gloss CMA (AAMA) on 07/29/2009   Method used:   Faxed to ...       [facility] (retail)       [address]       [place], [place]  [address]       Ph: [phone]       Fax: [phone]   RxID:   [id number]

## 2010-02-09 NOTE — Assessment & Plan Note (Signed)
Summary: ?poison oak/#/cd   Vital Signs:  Patient profile:   50 year old female Height:      62 inches Weight:      173.38 pounds BMI:     31.83 O2 Sat:      98 % on Room air Temp:     98.7 degrees F oral Pulse rate:   78 / minute BP sitting:   100 / 60  (left arm) Cuff size:   regular  Vitals Entered By: Zella Ball Ewing CMA Duncan Dull) (November 30, 2009 9:21 AM)  O2 Flow:  Room air CC: poison oak/RE   CC:  poison oak/RE.  History of Present Illness: here with acute c/o rash to the RLQ abd/side with marked itching and hard to stop scratching, but no fever, other rash, chills or swelling  Seemed to start x 3 days after working in the yard and may have gotten some plant material to touch her side as she reached up high to work adn shirt tail rasied up.  Pt denies CP, worsening sob, doe, wheezing, orthopnea, pnd, worsening LE edema, palps, dizziness or syncope  Pt denies new neuro symptoms such as headache, facial or extremity weakness  Pt denies polydipsia, polyuria, or low sugar symptoms such as shakiness improved with eating.  Overall good compliance with meds, trying to follow low chol, DM diet, wt stable, little excercise however Overall good compliance with meds, and good tolerability.   Preventive Screening-Counseling & Management      Drug Use:  no.    Problems Prior to Update: 1)  Contact Dermatitis  (ICD-692.9) 2)  Preventive Health Care  (ICD-V70.0) 3)  Uri  (ICD-465.9) 4)  Cellulitis and Abscess of Trunk  (ICD-682.2) 5)  Allergic Rhinitis  (ICD-477.9) 6)  Sinusitis- Acute-nos  (ICD-461.9) 7)  Preventive Health Care  (ICD-V70.0) 8)  Intermittent Vertigo  (ICD-780.4) 9)  Otitis Media, Acute, Bilateral  (ICD-382.9) 10)  Plantar Fasciitis, Left  (ICD-728.71) 11)  Skin Lesion  (ICD-709.9) 12)  Sinusitis- Acute-nos  (ICD-461.9) 13)  Hypertension  (ICD-401.9) 14)  Vertigo  (ICD-780.4) 15)  Hyperlipidemia  (ICD-272.4) 16)  Preventive Health Care  (ICD-V70.0) 17)  Routine  General Medical Exam@[facility]  (ICD-V70.0) 18)  Diabetes Mellitus, Type II  (ICD-250.00)  Medications Prior to Update: 1)  Metformin Hcl 1000 Mg Tabs (Metformin Hcl) .Marland Kitchen.. 1po Two Times A Day 2)  Ecotrin Low Strength 81 Mg  Tbec (Aspirin) .Marland Kitchen.. 1 By Mouth Qd 3)  Onetouch Ultra Test   Strp (Glucose Blood) .... Use Asd Two Times A Day 4)  Glimepiride 1 Mg  Tabs (Glimepiride) .... 1/2 By Mouth Two Times A Day (Noon and Later With Evening Meal) 5)  Onetouch Ultrasoft Lancets   Misc (Lancets) .Marland Kitchen.. 1 By Mouth Two Times A Day 6)  Lisinopril 5 Mg  Tabs (Lisinopril) .Marland Kitchen.. 1 By Mouth Once Daily 7)  Pravachol 40 Mg Tabs (Pravastatin Sodium) .Marland Kitchen.. 1 By Mouth Once Daily 8)  Fluticasone Propionate 50 Mcg/act Susp (Fluticasone Propionate) .... 2 Spray/side Once Daily 9)  Fexofenadine Hcl 180 Mg Tabs (Fexofenadine Hcl) .Marland Kitchen.. 1po Once Daily As Needed Allergies  Current Medications (verified): 1)  Metformin Hcl 1000 Mg Tabs (Metformin Hcl) .Marland Kitchen.. 1po Two Times A Day 2)  Ecotrin Low Strength 81 Mg  Tbec (Aspirin) .Marland Kitchen.. 1 By Mouth Qd 3)  Onetouch Ultra Test   Strp (Glucose Blood) .... Use Asd Two Times A Day 4)  Glimepiride 1 Mg  Tabs (Glimepiride) .... 1/2 By Mouth Two  Times A Day (Noon and Later With Evening Meal) 5)  Onetouch Ultrasoft Lancets   Misc (Lancets) .Marland Kitchen.. 1 By Mouth Two Times A Day 6)  Lisinopril 5 Mg  Tabs (Lisinopril) .Marland Kitchen.. 1 By Mouth Once Daily 7)  Pravachol 40 Mg Tabs (Pravastatin Sodium) .Marland Kitchen.. 1 By Mouth Once Daily 8)  Fluticasone Propionate 50 Mcg/act Susp (Fluticasone Propionate) .... 2 Spray/side Once Daily 9)  Fexofenadine Hcl 180 Mg Tabs (Fexofenadine Hcl) .Marland Kitchen.. 1po Once Daily As Needed Allergies 10)  Prednisone 10 Mg Tabs (Prednisone) .... 3po Qd For 3days, Then 2po Qd For 3days, Then 1po Qd For 3days, Then Stop 11)  Triamcinolone Acetonide 0.1 % Crea (Triamcinolone Acetonide) .... Use Asd Two Times A Day As Needed  Allergies (verified): 1)  ! 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   Head:  normocephalic and atraumatic.   Eyes:  vision grossly intact, pupils equal, and pupils round.   Ears:  R ear normal and L ear normal.   Nose:  no external deformity and no nasal discharge.   Mouth:  no gingival abnormalities and pharynx pink and moist.   Neck:  supple and no masses.   Lungs:  normal respiratory effort and normal breath sounds.   Heart:  normal rate and regular rhythm.   Extremities:  no edema, no erythema  Skin:  RLQ abd area skin wtih 4x6 cm area typical dermatitis , nontender nonvesicular without surrounding erythema, tender or red streaks   Impression & Recommendations:  Problem # 1:  CONTACT DERMATITIS (ICD-692.9)  Her updated medication list for this problem includes:    Fexofenadine Hcl 180 Mg Tabs (Fexofenadine hcl) .Marland Kitchen... 1po once daily as needed allergies    Prednisone 10 Mg Tabs (Prednisone) .Marland Kitchen... 3po qd for 3days, then 2po qd for 3days, then 1po qd for 3days, then stop    Triamcinolone Acetonide 0.1 % Crea (Triamcinolone acetonide) ..... Use asd two times a day as needed  Orders: Depo- Medrol 40mg  (J1030) Depo- Medrol 80mg  (J1040) Admin of Therapeutic Inj  intramuscular or subcutaneous (16109) treat as above, f/u any worsening signs or symptoms , for depomedrol IM and predpack for home, atarax for itching  Problem # 2:  HYPERTENSION (ICD-401.9)  Her  updated medication list for this problem includes:    Lisinopril 5 Mg Tabs (Lisinopril) .Marland Kitchen... 1 by mouth once daily  BP today: 100/60 Prior BP: 114/70 (07/13/2009)  Labs Reviewed: K+: 4.3 (07/04/2009) Creat: : 0.6 (07/04/2009)   Chol: 132 (07/04/2009)   HDL: 33.50 (07/04/2009)   LDL: 69 (07/04/2009)   TG: 146.0 (07/04/2009) stable overall by hx and exam, ok to continue meds/tx as is   Problem # 3:  DIABETES MELLITUS, TYPE II (ICD-250.00)  Her updated medication list for this problem includes:    Metformin Hcl 1000 Mg Tabs (Metformin hcl) .Marland Kitchen... 1po two times a day    Ecotrin Low Strength 81 Mg Tbec (Aspirin) .Marland Kitchen... 1 by mouth  qd    Glimepiride 1 Mg Tabs (Glimepiride) .Marland Kitchen... 1/2 by mouth two times a day (noon and later with evening meal)    Lisinopril 5 Mg Tabs (Lisinopril) .Marland Kitchen... 1 by mouth once daily  Labs Reviewed: Creat: 0.6 (07/04/2009)    Reviewed HgBA1c results: 6.7 (07/04/2009)  7.5 (12/30/2008) stable overall by hx and exam, ok to continue meds/tx as is , pt to call for onset polys or sugar > 200  Complete Medication List: 1)  Metformin Hcl 1000 Mg Tabs (Metformin hcl) .Marland Kitchen.. 1po two times a day 2)  Ecotrin Low Strength 81 Mg Tbec (Aspirin) .Marland Kitchen.. 1 by mouth qd 3)  Onetouch Ultra Test Strp (Glucose blood) .... Use asd two times a day 4)  Glimepiride 1 Mg Tabs (Glimepiride) .... 1/2 by mouth two times a day (noon and later with evening meal) 5)  Onetouch Ultrasoft Lancets Misc (Lancets) .Marland Kitchen.. 1 by mouth two times a day 6)  Lisinopril 5 Mg Tabs (Lisinopril) .Marland Kitchen.. 1 by mouth once daily 7)  Pravachol 40 Mg Tabs (Pravastatin sodium) .Marland Kitchen.. 1 by mouth once daily 8)  Fluticasone Propionate 50 Mcg/act Susp (Fluticasone propionate) .... 2 spray/side once daily 9)  Fexofenadine Hcl 180 Mg Tabs (Fexofenadine hcl) .Marland Kitchen.. 1po once daily as needed allergies 10)  Prednisone 10 Mg Tabs (Prednisone) .... 3po qd for 3days, then 2po qd for 3days, then 1po qd for 3days, then stop 11)  Triamcinolone  Acetonide 0.1 % Crea (Triamcinolone acetonide) .... Use asd two times a day as needed  Patient Instructions: 1)  Please take all new medications as prescribed 2)  you had the steroid shot today 3)  Continue all previous medications as before this visit  4)  Please schedule a follow-up appointment in 2 months with CPX  and labs and: 5)  HbgA1C prior to visit, ICD-9: 250.02 6)  Urine Microalbumin prior to visit, ICD-9: Prescriptions: TRIAMCINOLONE ACETONIDE 0.1 % CREA (TRIAMCINOLONE ACETONIDE) use asd two times a day as needed  #1large x 1   Entered and Authorized by:   Corwin Levins MD   Signed by:   Corwin Levins MD on 11/30/2009   Method used:   Print then Give to Patient   RxID:   [id number] PREDNISONE 10 MG TABS (PREDNISONE) 3po qd for 3days, then 2po qd for 3days, then 1po qd for 3days, then stop  #18 x 0   Entered and Authorized by:   Corwin Levins MD   Signed by:   Corwin Levins MD on 11/30/2009   Method used:   Print then Give to Patient   RxID:   [id number]    Medication Administration  Injection # 1:    Medication: Depo- Medrol 40mg     Diagnosis: CONTACT DERMATITIS (ICD-692.9)    Route: IM    Site: LUOQ gluteus    Exp Date: 05/2012    Lot #: [id number]    Mfr: [employer]    Comments: patient received 120mg  Depo-medrol    Patient tolerated injection without complications    Given by: Zella Ball Ewing CMA (AAMA) (November 30, 2009 10:18 AM)  Injection # 2:    Medication: Depo- Medrol 80mg     Diagnosis: CONTACT DERMATITIS (ICD-692.9)    Route: IM    Site: LUOQ gluteus    Exp Date: 05/2012    Lot #: [id number]    Mfr: [employer]    Given by: Zella Ball Ewing CMA (AAMA) (November 30, 2009 10:18 AM)  Orders Added: 1)  Depo- Medrol 40mg  [  J1030] 2)  Depo- Medrol 80mg  [J1040] 3)  Admin of Therapeutic Inj  intramuscular or subcutaneous [96372] 4)  Est. Patient Level IV [10272]

## 2010-02-09 NOTE — Assessment & Plan Note (Signed)
Summary: 6 MO ROV /NWS  #   Vital Signs:  Patient profile:   50 year old female Height:      62 inches Weight:      176.50 pounds BMI:     32.40 O2 Sat:      98 % on Room air Temp:     98.7 degrees F oral Pulse rate:   83 / minute BP sitting:   114 / 70  (left arm) Cuff size:   regular  Vitals Entered By: Zella Ball Ewing CMA (AAMA) (July 13, 2009 4:19 PM)  O2 Flow:  Room air CC: 6 Month ROV/RE   CC:  6 Month ROV/RE.  History of Present Illness: overall doing well;  Pt denies CP, sob, doe, wheezing, orthopnea, pnd, worsening LE edema, palps, dizziness or syncope  Pt denies new neuro symptoms such as headache, facial or extremity weakness  Pt denies polydipsia, polyuria, or low sugar symptoms such as shakiness improved with eating.  Overall good compliance with meds, trying to follow low chol, DM diet, wt stable, little excercise however   Does have tendency to mild AM cbgs about 150 or slightly higher.  Does also have mild nasal allergies with mostly itch and sneeze, without severe congestion or difficulty breathing;  no ST, cough or wheezing or sob.  Problems Prior to Update: 1)  Preventive Health Care  (ICD-V70.0) 2)  Uri  (ICD-465.9) 3)  Cellulitis and Abscess of Trunk  (ICD-682.2) 4)  Allergic Rhinitis  (ICD-477.9) 5)  Sinusitis- Acute-nos  (ICD-461.9) 6)  Preventive Health Care  (ICD-V70.0) 7)  Intermittent Vertigo  (ICD-780.4) 8)  Otitis Media, Acute, Bilateral  (ICD-382.9) 9)  Plantar Fasciitis, Left  (ICD-728.71) 10)  Skin Lesion  (ICD-709.9) 11)  Sinusitis- Acute-nos  (ICD-461.9) 12)  Hypertension  (ICD-401.9) 13)  Vertigo  (ICD-780.4) 14)  Hyperlipidemia  (ICD-272.4) 15)  Preventive Health Care  (ICD-V70.0) 16)  Routine General Medical Exam@health  Care [facility]  (ICD-V70.0) 17)  Diabetes Mellitus, Type II  (ICD-250.00)  Medications Prior to Update: 1)  Metformin Hcl 500 Mg  Tabs (Metformin Hcl) .... 2 By Mouth Two Times A Day 2)  Ecotrin Low Strength 81 Mg  Tbec  (Aspirin) .Marland Kitchen.. 1 By Mouth Qd 3)  Onetouch Ultra Test   Strp (Glucose Blood) .... Use Asd Two Times A Day 4)  Glimepiride 1 Mg  Tabs (Glimepiride) .Marland Kitchen.. 1 By Mouth Once Daily At Noon 5)  Onetouch Ultrasoft Lancets   Misc (Lancets) .Marland Kitchen.. 1 By Mouth Two Times A Day 6)  Lisinopril 5 Mg  Tabs (Lisinopril) .Marland Kitchen.. 1 By Mouth Once Daily 7)  Pravachol 40 Mg Tabs (Pravastatin Sodium) .Marland Kitchen.. 1 By Mouth Once Daily 8)  Fluticasone Propionate 50 Mcg/act Susp (Fluticasone Propionate) .... 2 Spray/side Once Daily 9)  Fexofenadine Hcl 180 Mg Tabs (Fexofenadine Hcl) .Marland Kitchen.. 1po Once Daily As Needed Allergies  Current Medications (verified): 1)  Metformin Hcl 1000 Mg Tabs (Metformin Hcl) .Marland Kitchen.. 1po Two Times A Day 2)  Ecotrin Low Strength 81 Mg  Tbec (Aspirin) .Marland Kitchen.. 1 By Mouth Qd 3)  Onetouch Ultra Test   Strp (Glucose Blood) .... Use Asd Two Times A Day 4)  Glimepiride 1 Mg  Tabs (Glimepiride) .... 1/2 By Mouth Two Times A Day (Noon and Later With Evening Meal) 5)  Onetouch Ultrasoft Lancets   Misc (Lancets) .Marland Kitchen.. 1 By Mouth Two Times A Day 6)  Lisinopril 5 Mg  Tabs (Lisinopril) .Marland Kitchen.. 1 By Mouth Once Daily 7)  Pravachol 40 Mg  Tabs (Pravastatin Sodium) .Marland Kitchen.. 1 By Mouth Once Daily 8)  Fluticasone Propionate 50 Mcg/act Susp (Fluticasone Propionate) .... 2 Spray/side Once Daily 9)  Fexofenadine Hcl 180 Mg Tabs (Fexofenadine Hcl) .Marland Kitchen.. 1po Once Daily As Needed Allergies  Allergies (verified): 1)  ! 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   Head:  normocephalic  and atraumatic.   Eyes:  vision grossly intact, pupils equal, and pupils round.   Ears:  R ear normal and L ear normal.   Nose:  no external deformity and no nasal discharge.   Mouth:  no gingival abnormalities and pharynx pink and moist.   Neck:  supple and no masses.   Lungs:  normal respiratory effort and normal breath sounds.   Heart:  normal rate and regular rhythm.   Extremities:  no edema, no erythema    Impression & Recommendations:  Problem # 1:  HYPERTENSION (ICD-401.9)  Her updated medication list for this problem includes:    Lisinopril 5 Mg Tabs (Lisinopril) .Marland Kitchen... 1 by mouth once daily  BP today: 114/70 Prior BP: 120/78 (01/11/2009)  Labs Reviewed: K+: 4.3 (07/04/2009) Creat: : 0.6 (07/04/2009)   Chol: 132 (07/04/2009)   HDL: 33.50 (07/04/2009)   LDL: 69 (07/04/2009)   TG: 146.0 (07/04/2009) stable overall by hx and exam, ok to continue meds/tx as is   Problem # 2:  DIABETES MELLITUS, TYPE II (ICD-250.00)  Her updated medication list for this problem includes:    Metformin Hcl 1000 Mg Tabs (Metformin hcl) .Marland Kitchen... 1po two times a day    Ecotrin Low Strength 81 Mg Tbec (Aspirin) .Marland Kitchen... 1 by mouth qd    Glimepiride 1 Mg Tabs (Glimepiride) .Marland Kitchen... 1/2 by mouth two times a day (noon and later with evening meal)    Lisinopril 5 Mg Tabs (Lisinopril) .Marland Kitchen... 1 by mouth once daily  Labs Reviewed: Creat: 0.6 (07/04/2009)    Reviewed HgBA1c results: 6.7 (07/04/2009)  7.5 (12/30/2008) stable overall by hx and exam, ok to continue meds/tx as is except change the glimeparide as above to better cover the am elevated sugars (tends to eat late at night)  Problem # 3:  HYPERLIPIDEMIA (ICD-272.4)  Her updated medication list for this problem includes:    Pravachol 40 Mg Tabs (Pravastatin sodium) .Marland Kitchen... 1 by mouth once daily  Labs Reviewed: SGOT: 54 (12/30/2008)   SGPT: 67 (12/30/2008)   HDL:33.50 (07/04/2009), 37.50 (12/30/2008)  LDL:69 (07/04/2009), 74 (12/30/2008)  Chol:132  (07/04/2009), 130 (12/30/2008)  Trig:146.0 (07/04/2009), 95.0 (12/30/2008) stable overall by hx and exam, ok to continue meds/tx as is   Problem # 4:  ALLERGIC RHINITIS (ICD-477.9)  Her updated medication list for this problem includes:    Fluticasone Propionate 50 Mcg/act Susp (Fluticasone propionate) .Marland Kitchen... 2 spray/side once daily    Fexofenadine Hcl 180 Mg Tabs (Fexofenadine hcl) .Marland Kitchen... 1po once daily as needed allergies treat as above, f/u any worsening signs or symptoms   Complete Medication List: 1)  Metformin Hcl 1000 Mg Tabs (Metformin hcl) .Marland Kitchen.. 1po two times a day 2)  Ecotrin Low Strength 81 Mg Tbec (Aspirin) .Marland KitchenMarland KitchenMarland Kitchen  1 by mouth qd 3)  Onetouch Ultra Test Strp (Glucose blood) .... Use asd two times a day 4)  Glimepiride 1 Mg Tabs (Glimepiride) .... 1/2 by mouth two times a day (noon and later with evening meal) 5)  Onetouch Ultrasoft Lancets Misc (Lancets) .Marland Kitchen.. 1 by mouth two times a day 6)  Lisinopril 5 Mg Tabs (Lisinopril) .Marland Kitchen.. 1 by mouth once daily 7)  Pravachol 40 Mg Tabs (Pravastatin sodium) .Marland Kitchen.. 1 by mouth once daily 8)  Fluticasone Propionate 50 Mcg/act Susp (Fluticasone propionate) .... 2 spray/side once daily 9)  Fexofenadine Hcl 180 Mg Tabs (Fexofenadine hcl) .Marland Kitchen.. 1po once daily as needed allergies  Patient Instructions: 1)  please change the glimeparide to HALF pill twice per day (noon and with the evening meal) 2)  Please take all new medications as prescribed 3)  Continue all previous medications as before this visit 4)  Please schedule a follow-up appointment in 6 months with CPX labs  and: 5)  HbgA1C prior to visit, ICD-9: 250/02 6)  Urine Microalbumin prior to visit, ICD-9: Prescriptions: METFORMIN HCL 1000 MG TABS (METFORMIN HCL) 1po two times a day  #180 x 3   Entered and Authorized by:   Corwin Levins MD   Signed by:   Corwin Levins MD on 07/13/2009   Method used:   Electronically to        [facility]  [phone]* (retail)       [address]       [place], [place]  [address]       Ph: [phone] or [phone]       Fax: [phone]   RxID:   [id number] FEXOFENADINE HCL 180 MG TABS (FEXOFENADINE HCL) 1po once daily as needed allergies  #90 x 3   Entered and Authorized by:   Corwin Levins MD   Signed by:   Corwin Levins MD on 07/13/2009   Method used:   Print then Give to Patient   RxID:   [id number]

## 2010-02-14 ENCOUNTER — Encounter: Payer: Self-pay | Admitting: Internal Medicine

## 2010-02-15 ENCOUNTER — Encounter: Payer: Self-pay | Admitting: Internal Medicine

## 2010-02-15 ENCOUNTER — Other Ambulatory Visit: Payer: Self-pay | Admitting: Gynecology

## 2010-02-15 ENCOUNTER — Encounter: Payer: 59 | Admitting: Gynecology

## 2010-02-15 ENCOUNTER — Other Ambulatory Visit (HOSPITAL_COMMUNITY)
Admission: RE | Admit: 2010-02-15 | Discharge: 2010-02-15 | Disposition: A | Discharge status: Home / Self Care | Payer: 59 | Source: Ambulatory Visit | Attending: Gynecology | Admitting: Gynecology

## 2010-02-15 DIAGNOSIS — N951 Menopausal and female climacteric states: Secondary | ICD-10-CM

## 2010-02-15 DIAGNOSIS — Z124 Encounter for screening for malignant neoplasm of cervix: Secondary | ICD-10-CM | POA: Insufficient documentation

## 2010-02-15 DIAGNOSIS — Z01419 Encounter for gynecological examination (general) (routine) without abnormal findings: Secondary | ICD-10-CM

## 2010-02-15 DIAGNOSIS — N912 Amenorrhea, unspecified: Secondary | ICD-10-CM

## 2010-02-15 NOTE — Assessment & Plan Note (Signed)
Summary: CPX /NWS  #   Vital Signs:  Patient profile:   50 year old female Height:      62 inches Weight:      172.50 pounds BMI:     31.66 O2 Sat:      97 % on Room air Temp:     98.4 degrees F oral Pulse rate:   85 / minute BP sitting:   120 / 72  (left arm) Cuff size:   regular  Vitals Entered By: Zella Ball Ewing CMA (AAMA) (February 07, 2010 1:13 PM)  O2 Flow:  Room air  Preventive Care Screening  Mammogram:    Date:  01/08/2009    Results:  normal   CC: Adult Physical/RE   CC:  Adult Physical/RE.  History of Present Illness: here for wellness, overall doing ok,  Pt denies CP, worsening sob, doe, wheezing, orthopnea, pnd, worsening LE edema, palps, dizziness or syncope  Pt denies new neuro symptoms such as headache, facial or extremity weakness  Pt denies polydipsia, polyuria, or low sugar symptoms such as shakiness improved with eating.  Overall good compliance with meds, trying to follow low chol, DM diet, wt stable, little excercise however  CBG's in the lower 100's  Overall good compliance with meds, and good tolerability.  No fever, wt loss, night sweats, loss of appetite or other constitutional symptoms  Denies worsening depressive symptoms, suicidal ideation, or panic.   Pt states good ability with ADL's, low fall risk, home safety reviewed and adequate, no significant change in hearing or vision, trying to follow lower chol diet, and occasionally active only with regular excercise.   Problems Prior to Update: 1)  Contact Dermatitis  (ICD-692.9) 2)  Preventive Health Care  (ICD-V70.0) 3)  Uri  (ICD-465.9) 4)  Cellulitis and Abscess of Trunk  (ICD-682.2) 5)  Allergic Rhinitis  (ICD-477.9) 6)  Sinusitis- Acute-nos  (ICD-461.9) 7)  Preventive Health Care  (ICD-V70.0) 8)  Intermittent Vertigo  (ICD-780.4) 9)  Otitis Media, Acute, Bilateral  (ICD-382.9) 10)  Plantar Fasciitis, Left  (ICD-728.71) 11)  Skin Lesion  (ICD-709.9) 12)  Sinusitis- Acute-nos  (ICD-461.9) 13)   Hypertension  (ICD-401.9) 14)  Vertigo  (ICD-780.4) 15)  Hyperlipidemia  (ICD-272.4) 16)  Preventive Health Care  (ICD-V70.0) 17)  Routine General Medical Exam@[facility]  (ICD-V70.0) 18)  Diabetes Mellitus, Type II  (ICD-250.00)  Medications Prior to Update: 1)  Metformin Hcl 1000 Mg Tabs (Metformin Hcl) .Marland Kitchen.. 1po Two Times A Day 2)  Ecotrin Low Strength 81 Mg  Tbec (Aspirin) .Marland Kitchen.. 1 By Mouth Qd 3)  Onetouch Ultra Test   Strp (Glucose Blood) .... Use Asd Two Times A Day 4)  Glimepiride 1 Mg  Tabs (Glimepiride) .... 1/2 By Mouth Two Times A Day (Noon and Later With Evening Meal) 5)  Onetouch Ultrasoft Lancets   Misc (Lancets) .Marland Kitchen.. 1 By Mouth Two Times A Day 6)  Lisinopril 5 Mg  Tabs (Lisinopril) .Marland Kitchen.. 1 By Mouth Once Daily 7)  Pravachol 40 Mg Tabs (Pravastatin Sodium) .Marland Kitchen.. 1 By Mouth Once Daily 8)  Fluticasone Propionate 50 Mcg/act Susp (Fluticasone Propionate) .... 2 Spray/side Once Daily 9)  Fexofenadine Hcl 180 Mg Tabs (Fexofenadine Hcl) .Marland Kitchen.. 1po Once Daily As Needed Allergies 10)  Prednisone 10 Mg Tabs (Prednisone) .... 3po Qd For 3days, Then 2po Qd For 3days, Then 1po Qd For 3days, Then Stop 11)  Triamcinolone Acetonide 0.1 % Crea (Triamcinolone Acetonide) .... Use Asd Two Times A Day As Needed  Current Medications (verified):  1)  Metformin Hcl 1000 Mg Tabs (Metformin Hcl) .Marland Kitchen.. 1po Two Times A Day 2)  Ecotrin Low Strength 81 Mg  Tbec (Aspirin) .Marland Kitchen.. 1 By Mouth Qd 3)  Onetouch Ultra Test   Strp (Glucose Blood) .... Use Asd Two Times A Day 4)  Glimepiride 1 Mg  Tabs (Glimepiride) .... 1/2 By Mouth Two Times A Day (Noon and Later With Evening Meal) 5)  Onetouch Ultrasoft Lancets   Misc (Lancets) .Marland Kitchen.. 1 By Mouth Two Times A Day 6)  Lisinopril 5 Mg  Tabs (Lisinopril) .Marland Kitchen.. 1 By Mouth Once Daily 7)  Pravachol 40 Mg Tabs (Pravastatin Sodium) .Marland Kitchen.. 1 By Mouth Once Daily 8)  Fluticasone Propionate 50 Mcg/act Susp (Fluticasone Propionate) .... 2 Spray/side Once Daily 9)  Fexofenadine Hcl 180  Mg Tabs (Fexofenadine Hcl) .Marland Kitchen.. 1po Once Daily As Needed Allergies  Allergies (verified): 1)  ! Penicillin  Past History:  Past Surgical History: Last updated: 11/29/2006 Tubal ligation  Family History: Last updated: 01/11/2009 mother with MI, chf, stroke, lung cancer, breast cancer father with prostate cancer, CAD, DM. 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 Drug use-no  Risk Factors: Smoking Status: never (11/29/2006)  Past Medical History: Diabetes mellitus, type II uterine polyps Hypertension Allergic rhinitis Hyperlipidemia  Review of Systems  The patient denies anorexia, fever, vision loss, decreased hearing, hoarseness, chest pain, syncope, dyspnea on exertion, peripheral edema, prolonged cough, headaches, hemoptysis, abdominal pain, melena, hematochezia, severe indigestion/heartburn, hematuria, muscle weakness, suspicious skin lesions, transient blindness, difficulty walking, depression, unusual weight change, abnormal bleeding, enlarged lymph nodes, and angioedema.         all otherwise negative per pt -  - snores at night but no significant daytime somnlence  Physical Exam  General:  alert and overweight-appearing.   Head:  normocephalic and atraumatic.   Eyes:  vision grossly intact, pupils equal, and pupils round.   Ears:  R ear normal and L ear normal.   Nose:  no external deformity and no nasal discharge.   Mouth:  no gingival abnormalities and pharynx pink and moist.   Neck:  supple and no masses.   Lungs:  normal respiratory effort and normal breath sounds.   Heart:  normal rate and regular rhythm.   Abdomen:  soft, non-tender, and normal bowel sounds.   Msk:  no joint tenderness and no joint swelling.   Extremities:  no edema, no erythema  Neurologic:  cranial nerves II-XII intact and strength normal in all extremities.   Skin:  color  normal and no rashes.   Psych:  not anxious appearing and not depressed appearing.     Impression & Recommendations:  Problem # 1:  Preventive Health Care (ICD-V70.0) Assessment Unchanged Overall doing well, age appropriate education and counseling updated, referral for preventive services and immunizations addressed, dietary counseling and smoking status adressed , most recent labs reviewed I have personally reviewed and have noted 1.The patient's medical and social history 2.Their use of alcohol, tobacco or illicit drugs 3.Their current medications and supplements 4. Functional ability including ADL's, fall risk, home safety risk, hearing & visual impairment  5.Diet and physical activities 6.Evidence for depression or mood disorders The patients weight, height, BMI  have been recorded in the chart I have made referrals, counseling and provided education to the patient based review of the above  Orders: EKG w/ Interpretation (93000)  Problem # 2:  DIABETES MELLITUS,  TYPE II (ICD-250.00)  Her updated medication list for this problem includes:    Metformin Hcl 1000 Mg Tabs (Metformin hcl) .Marland Kitchen... 1po two times a day    Ecotrin Low Strength 81 Mg Tbec (Aspirin) .Marland Kitchen... 1 by mouth qd    Glimepiride 1 Mg Tabs (Glimepiride) .Marland Kitchen... 1/2 by mouth two times a day (noon and later with evening meal)    Lisinopril 5 Mg Tabs (Lisinopril) .Marland Kitchen... 1 by mouth once daily  Labs Reviewed: Creat: 0.6 (01/31/2010)    Reviewed HgBA1c results: 6.8 (01/31/2010)  6.7 (07/04/2009) stable overall by hx and exam, ok to continue meds/tx as is   Problem # 3:  HYPERTENSION (ICD-401.9)  Her updated medication list for this problem includes:    Lisinopril 5 Mg Tabs (Lisinopril) .Marland Kitchen... 1 by mouth once daily  BP today: 120/72 Prior BP: 100/60 (11/30/2009)  Labs Reviewed: K+: 5.2 (01/31/2010) Creat: : 0.6 (01/31/2010)   Chol: 144 (01/31/2010)   HDL: 43.10 (01/31/2010)   LDL: 83 (01/31/2010)   TG: 89.0  (01/31/2010) stable overall by hx and exam, ok to continue meds/tx as is   Problem # 4:  HYPERLIPIDEMIA (ICD-272.4)  Her updated medication list for this problem includes:    Pravachol 40 Mg Tabs (Pravastatin sodium) .Marland Kitchen... 1 by mouth once daily  Labs Reviewed: SGOT: 27 (01/31/2010)   SGPT: 33 (01/31/2010)   HDL:43.10 (01/31/2010), 33.50 (07/04/2009)  LDL:83 (01/31/2010), 69 (07/04/2009)  Chol:144 (01/31/2010), 132 (07/04/2009)  Trig:89.0 (01/31/2010), 146.0 (07/04/2009) stable overall by hx and exam, ok to continue meds/tx as is   Complete Medication List: 1)  Metformin Hcl 1000 Mg Tabs (Metformin hcl) .Marland Kitchen.. 1po two times a day 2)  Ecotrin Low Strength 81 Mg Tbec (Aspirin) .Marland Kitchen.. 1 by mouth qd 3)  Onetouch Ultra Test Strp (Glucose blood) .... Use asd two times a day 4)  Glimepiride 1 Mg Tabs (Glimepiride) .... 1/2 by mouth two times a day (noon and later with evening meal) 5)  Onetouch Ultrasoft Lancets Misc (Lancets) .Marland Kitchen.. 1 by mouth two times a day 6)  Lisinopril 5 Mg Tabs (Lisinopril) .Marland Kitchen.. 1 by mouth once daily 7)  Pravachol 40 Mg Tabs (Pravastatin sodium) .Marland Kitchen.. 1 by mouth once daily 8)  Fluticasone Propionate 50 Mcg/act Susp (Fluticasone propionate) .... 2 spray/side once daily 9)  Fexofenadine Hcl 180 Mg Tabs (Fexofenadine hcl) .Marland Kitchen.. 1po once daily as needed allergies  Other Orders: Flu Vaccine 4yrs + (16109) Admin 1st Vaccine (60454)  Patient Instructions: 1)  please call for your yearly mammogram and pap smear 2)  you had the flu shot today 3)  Continue all previous medications as before this visit  4)  Please schedule a follow-up appointment in 6 months with: 5)  BMP prior to visit, ICD-9: 250.02 6)  Lipid Panel prior to visit, ICD-9: 7)  HbgA1C prior to visit, ICD-9:   Orders Added: 1)  EKG w/ Interpretation [93000] 2)  Flu Vaccine 58yrs + [90658] 3)  Admin 1st Vaccine [90471] 4)  Est. Patient 40-64 years [99396]   Immunizations Administered:  Influenza Vaccine # 1:     Vaccine Type: Fluvax 3+    Site: left deltoid    Mfr: Sanofi Pasteur    Dose: 0.5 ml    Route: IM    Given by: Zella Ball Ewing CMA (AAMA)    Exp. Date: 08/07/2010    Lot #: [id number]    VIS given: 08/02/09 version given February 07, 2010.  Flu Vaccine Consent Questions:    Do you have  a history of severe allergic reactions to this vaccine? no    Any prior history of allergic reactions to egg and/or gelatin? no    Do you have a sensitivity to the preservative Thimersol? no    Do you have a past history of Guillan-Barre Syndrome? no    Do you currently have an acute febrile illness? no    Have you ever had a severe reaction to latex? no    Vaccine information given and explained to patient? yes    Are you currently pregnant? no   Immunizations Administered:  Influenza Vaccine # 1:    Vaccine Type: Fluvax 3+    Site: left deltoid    Mfr: Sanofi Pasteur    Dose: 0.5 ml    Route: IM    Given by: Zella Ball Ewing CMA (AAMA)    Exp. Date: 08/07/2010    Lot #: [id number]    VIS given: 08/02/09 version given February 07, 2010.

## 2010-02-16 ENCOUNTER — Encounter: Payer: Self-pay | Admitting: Internal Medicine

## 2010-02-16 LAB — HM MAMMOGRAPHY: HM Mammogram: NORMAL

## 2010-02-20 ENCOUNTER — Encounter: Payer: Self-pay | Admitting: Internal Medicine

## 2010-02-20 ENCOUNTER — Other Ambulatory Visit: Payer: Self-pay | Admitting: Gynecology

## 2010-02-20 ENCOUNTER — Ambulatory Visit (INDEPENDENT_AMBULATORY_CARE_PROVIDER_SITE_OTHER): Payer: 59 | Admitting: Gynecology

## 2010-02-20 DIAGNOSIS — R635 Abnormal weight gain: Secondary | ICD-10-CM

## 2010-02-20 DIAGNOSIS — N912 Amenorrhea, unspecified: Secondary | ICD-10-CM

## 2010-02-21 ENCOUNTER — Encounter: Payer: Self-pay | Admitting: Internal Medicine

## 2010-03-16 NOTE — Letter (Signed)
Summary: [facility]  [facility]   Imported By: Lennie Odor 03/06/2010 10:19:20  _____________________________________________________________________  External Attachment:    Type:   Image     Comment:   External Document

## 2010-03-23 ENCOUNTER — Encounter: Payer: Self-pay | Admitting: Internal Medicine

## 2010-03-23 ENCOUNTER — Ambulatory Visit (INDEPENDENT_AMBULATORY_CARE_PROVIDER_SITE_OTHER): Payer: 59 | Admitting: Internal Medicine

## 2010-03-23 DIAGNOSIS — H6690 Otitis media, unspecified, unspecified ear: Secondary | ICD-10-CM

## 2010-03-23 DIAGNOSIS — I10 Essential (primary) hypertension: Secondary | ICD-10-CM

## 2010-03-23 DIAGNOSIS — E119 Type 2 diabetes mellitus without complications: Secondary | ICD-10-CM

## 2010-03-23 DIAGNOSIS — J209 Acute bronchitis, unspecified: Secondary | ICD-10-CM

## 2010-03-28 NOTE — Assessment & Plan Note (Signed)
Summary: CONGESTION AND COUGH/LB   Vital Signs:  Patient profile:   50 year old female Height:      62 inches Weight:      171.25 pounds BMI:     31.44 O2 Sat:      98 % on Room air Temp:     99.2 degrees F oral Pulse rate:   97 / minute BP sitting:   114 / 70  (left arm) Cuff size:   regular  Vitals Entered By: Zella Ball Ewing CMA Duncan Dull) (March 23, 2010 2:55 PM)  O2 Flow:  Room air CC: Cough and congestion/RE   CC:  Cough and congestion/RE.  History of Present Illness: here to f/u with acute onset 3 days left ear pain, reduced hearing adn popping sounds with tinnusitus, but no vertigo or n/v;  has HA, general weakness and malaise, then today with onset ST and increasingly prod cough greenish sputum, no blood.  Pt denies CP, worsening sob, doe, wheezing, orthopnea, pnd, worsening LE edema, palps, dizziness or syncope  Pt denies new neuro symptoms such as headache, facial or extremity weakness  Pt denies polydipsia, polyuria, or low sugar symptoms such as shakiness improved with eating.  Overall good compliance with meds, trying to follow low chol, DM diet, wt stable, little excercise however  CBG's in the lower 100.s.  No recent wt loss, night sweats, loss of appetite or other constitutional symptoms  Overall good compliance with meds, and good tolerability.  Denies worsening depressive symptoms, suicidal ideation, or panic, though has some ongoing anxiety, not worse recently.  Cough worse last night, could not sleep.  Still working through the illness, finances tight.  Problems Prior to Update: 1)  Bronchitis-acute  (ICD-466.0) 2)  Otitis Media, Acute, Left  (ICD-382.9) 3)  Contact Dermatitis  (ICD-692.9) 4)  Preventive Health Care  (ICD-V70.0) 5)  Uri  (ICD-465.9) 6)  Cellulitis and Abscess of Trunk  (ICD-682.2) 7)  Allergic Rhinitis  (ICD-477.9) 8)  Sinusitis- Acute-nos  (ICD-461.9) 9)  Preventive Health Care  (ICD-V70.0) 10)  Intermittent Vertigo  (ICD-780.4) 11)  Otitis Media,  Acute, Bilateral  (ICD-382.9) 12)  Plantar Fasciitis, Left  (ICD-728.71) 13)  Skin Lesion  (ICD-709.9) 14)  Sinusitis- Acute-nos  (ICD-461.9) 15)  Hypertension  (ICD-401.9) 16)  Vertigo  (ICD-780.4) 17)  Hyperlipidemia  (ICD-272.4) 18)  Preventive Health Care  (ICD-V70.0) 19)  Routine General Medical Exam@health  Care Fac[facility]  (ICD-V70.0) 20)  Diabetes Mellitus, Type II  (ICD-250.00)  Medications Prior to Update: 1)  Metformin Hcl 1000 Mg Tabs (Metformin Hcl) .Marland Kitchen.. 1po Two Times A Day 2)  Ecotrin Low Strength 81 Mg  Tbec (Aspirin) .Marland Kitchen.. 1 By Mouth Qd 3)  Onetouch Ultra Test   Strp (Glucose Blood) .... Use Asd Two Times A Day 4)  Glimepiride 1 Mg  Tabs (Glimepiride) .... 1/2 By Mouth Two Times A Day (Noon and Later With Evening Meal) 5)  Onetouch Ultrasoft Lancets   Misc (Lancets) .Marland Kitchen.. 1 By Mouth Two Times A Day 6)  Lisinopril 5 Mg  Tabs (Lisinopril) .Marland Kitchen.. 1 By Mouth Once Daily 7)  Pravachol 40 Mg Tabs (Pravastatin Sodium) .Marland Kitchen.. 1 By Mouth Once Daily 8)  Fluticasone Propionate 50 Mcg/act Susp (Fluticasone Propionate) .... 2 Spray/side Once Daily 9)  Fexofenadine Hcl 180 Mg Tabs (Fexofenadine Hcl) .Marland Kitchen.. 1po Once Daily As Needed Allergies  Current Medications (verified): 1)  Metformin Hcl 1000 Mg Tabs (Metformin Hcl) .Marland Kitchen.. 1po Two Times A Day 2)  Ecotrin Low Strength 81 Mg  Tbec (  Aspirin) .Marland Kitchen.. 1 By Mouth Qd 3)  Onetouch Ultra Test   Strp (Glucose Blood) .... Use Asd Two Times A Day 4)  Glimepiride 1 Mg  Tabs (Glimepiride) .... 1/2 By Mouth Two Times A Day (Noon and Later With Evening Meal) 5)  Onetouch Ultrasoft Lancets   Misc (Lancets) .Marland Kitchen.. 1 By Mouth Two Times A Day 6)  Lisinopril 5 Mg  Tabs (Lisinopril) .Marland Kitchen.. 1 By Mouth Once Daily 7)  Pravachol 40 Mg Tabs (Pravastatin Sodium) .Marland Kitchen.. 1 By Mouth Once Daily 8)  Fluticasone Propionate 50 Mcg/act Susp (Fluticasone Propionate) .... 2 Spray/side Once Daily 9)  Fexofenadine Hcl 180 Mg Tabs (Fexofenadine Hcl) .Marland Kitchen.. 1po Once Daily As Needed Allergies 10)   Levofloxacin 500 Mg Tabs (Levofloxacin) .Marland Kitchen.. 1po Once Daily 11)  Hydrocodone-Homatropine 5-1.5 Mg/46ml Syrp (Hydrocodone-Homatropine) .Marland Kitchen.. 1 Tsp By Mouth Q 6 Hrs As Needed Cough  Allergies (verified): 1)  ! 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  , mild ill  Head:  normocephalic and atraumatic.   Eyes:  vision grossly intact, pupils equal, and pupils round.   Ears:  bilat tm's red - left much worse with bulging and post fluid, sinus nontender Nose:  nasal dischargemucosal pallor and mucosal edema.   Mouth:  pharyngeal erythema and fair dentition.   Neck:  supple and cervical lymphadenopathy.   Lungs:  normal respiratory effort, R decreased breath sounds, and L decreased breath sounds. but no frank wheezing or rales Heart:  normal rate and regular rhythm.   Extremities:  no edema, no erythema    Impression & Recommendations:  Problem # 1:  OTITIS MEDIA, ACUTE, LEFT (ICD-382.9)  Her updated medication list for this problem includes:    Ecotrin Low Strength 81 Mg Tbec (Aspirin) .Marland Kitchen... 1 by mouth qd    Levofloxacin 500 Mg Tabs (Levofloxacin) .Marland Kitchen... 1po once daily  Instructed on prevention and treatment. Call if no improvement in 48-72 hours or sooner if worsening symptoms.  treat as above, f/u any worsening signs or symptoms   Problem # 2:  BRONCHITIS-ACUTE (ICD-466.0)  Her updated medication list for this problem includes:    Levofloxacin 500 Mg Tabs (Levofloxacin) .Marland Kitchen... 1po once daily     Hydrocodone-homatropine 5-1.5 Mg/64ml Syrp (Hydrocodone-homatropine) .Marland Kitchen... 1 tsp by mouth q 6 hrs as needed cough treat as above, f/u any worsening signs or symptoms   Take antibiotics and other medications as directed. Encouraged to push clear liquids, get enough rest, and take acetaminophen as needed. To be seen in 5-7 days if no improvement, sooner if worse.  Problem # 3:  DIABETES MELLITUS, TYPE II (ICD-250.00)  Labs Reviewed: Creat: 0.6 (01/31/2010)    Reviewed HgBA1c results: 6.8 (01/31/2010)  6.7 (07/04/2009) stable overall by hx and exam, ok to continue meds/tx as is  , pt to call for cbg > 200 or onset polys  Problem # 4:  HYPERTENSION (ICD-401.9)  Her updated medication list for this problem includes:    Lisinopril 5 Mg Tabs (Lisinopril) .Marland Kitchen... 1 by mouth once daily  BP today: 114/70 Prior BP:  120/72 (02/07/2010)  Labs Reviewed: K+: 5.2 (01/31/2010) Creat: : 0.6 (01/31/2010)   Chol: 144 (01/31/2010)   HDL: 43.10 (01/31/2010)   LDL: 83 (01/31/2010)   TG: 89.0 (01/31/2010) stable overall by hx and exam, ok to continue meds/tx as is   Complete Medication List: 1)  Metformin Hcl 1000 Mg Tabs (Metformin hcl) .Marland Kitchen.. 1po two times a day 2)  Ecotrin Low Strength 81 Mg Tbec (Aspirin) .Marland Kitchen.. 1 by mouth qd 3)  Onetouch Ultra Test Strp (Glucose blood) .... Use asd two times a day 4)  Glimepiride 1 Mg Tabs (Glimepiride) .... 1/2 by mouth two times a day (noon and later with evening meal) 5)  Onetouch Ultrasoft Lancets Misc (Lancets) .Marland Kitchen.. 1 by mouth two times a day 6)  Lisinopril 5 Mg Tabs (Lisinopril) .Marland Kitchen.. 1 by mouth once daily 7)  Pravachol 40 Mg Tabs (Pravastatin sodium) .Marland Kitchen.. 1 by mouth once daily 8)  Fluticasone Propionate 50 Mcg/act Susp (Fluticasone propionate) .... 2 spray/side once daily 9)  Fexofenadine Hcl 180 Mg Tabs (Fexofenadine hcl) .Marland Kitchen.. 1po once daily as needed allergies 10)  Levofloxacin 500 Mg Tabs (Levofloxacin) .Marland Kitchen.. 1po once daily 11)  Hydrocodone-homatropine 5-1.5  Mg/5ml Syrp (Hydrocodone-homatropine) .Marland Kitchen.. 1 tsp by mouth q 6 hrs as needed cough  Patient Instructions: 1)  Please take all new medications as prescribed 2)  Continue all previous medications as before this visit  3)  Please schedule a follow-up appointment in 3 months with: 4)  BMP prior to visit, ICD-9: 250.02 5)  Lipid Panel prior to visit, ICD-9: 6)  HbgA1C prior to visit, ICD-9: Prescriptions: HYDROCODONE-HOMATROPINE 5-1.5 MG/5ML SYRP (HYDROCODONE-HOMATROPINE) 1 tsp by mouth q 6 hrs as needed cough  #6oz x 1   Entered and Authorized by:   Corwin Levins MD   Signed by:   Corwin Levins MD on 03/23/2010   Method used:   Print then Give to Patient   RxID:   [id number] LEVOFLOXACIN 500 MG TABS (LEVOFLOXACIN) 1po once daily  #10 x 0   Entered and Authorized by:   Corwin Levins MD   Signed by:   Corwin Levins MD on 03/23/2010   Method used:   Print then Give to Patient   RxID:   [id number]    Orders Added: 1)  Est. Patient Level IV [84696]

## 2010-05-26 NOTE — Op Note (Signed)
[facility]  Patient:    Traci [name], Traci [name]                       MRN: [id number] Proc. Date: 09/06/99 Adm. Date:  [id number] Attending:  Tonye Royalty                           Operative Report  PREOPERATIVE DIAGNOSES:       1. Menometrorrhagia.                               2. Prolapsing submucous myoma.                               3. Intracavity submucous myoma.                               4. Request for elective permanent sterilization.  POSTOPERATIVE DIAGNOSES:      1. Menometrorrhagia.                               2. Prolapsing submucous myoma.                               3. Intracavity submucous myoma.                               4. Request for elective permanent sterilization.  OPERATION/PROCEDURE:          1. Laparoscopic tubal sterilization using                                  bilateral Hulka clip technique.                               2. Resectoscopic myomectomies.  SURGEON:                      Juan H. Lily Peer, M.D.  ANESTHESIA:                   General endotracheal anesthesia.  INDICATIONS FOR PROCEDURE:    The patient is a 50 year old gravida 2 para 2, with menometrorrhagia with prolapse of mucous myoma, and also intracavity submucous myoma.  The patient also requested elective permanent sterilization, for which she was counseled.  After the risks and benefits, pros and cons, and failure rate of all the above were discussed appropriate consent forms were signed.  FINDINGS:                     1. The patient had a prolapse of mucous myoma,                                  broad base, with attachment to the mid  portion of the posterior aspect of the                                  uterus.                               2. She also had a second submucous myoma in the                                  fundal aspect of the uterus attached to the  anterior portion, measuring approximately                                  2 cm in size.                               3. Laparoscopic evaluation showed a small                                  subserosal myoma measuring 1 cm in size.                                  Both tubes and ovaries were otherwise normal.                                  Liver surface area was normal on inspection.  DESCRIPTION OF PROCEDURE:     After the patient was adequately counseled she was taken to the operating room and underwent successful general endotracheal anesthesia.  She received Cefotan 1 g for prophylaxis.  She was placed in the lower lithotomy position and the abdomen, vagina, and perineum were prepped and draped in the usual sterile fashion.  Red rubber Robinson in-and-out catheterization obtained 150 cc.  The first portion of the operation consisted of proceeding with resectoscopic myomectomy.  With the Hulka tenaculum attached to the anterior cervical lip and a Sims retractor for exposure the prolapsing submucous myoma was identified and with the use of an Allis clamp it was placed on tension, and with the [facility] surgical generator it was catheterized and cut, and the stalk retracted.  This was performed in an effort to allow insertion of the operative resectoscope.  The cervix required minimal dilatation and the [facility] operative resectoscope with a 90 degree wire loop was inserted into the intrauterine cavity.  Chilled sorbitol 3% was the distending media.  Inspection of the intrauterine cavity demonstrated a coiled stalk of the remaining myoma that was prolapsing and was transected at its base, and passed off the operative field for histological evaluation.  A second submucous myoma was noted in the frontal region of the uterus with its attachment to the anterior fundal wall.  With the [facility] laboratory electrical surgical generator set at 80 watts in the cutting mode and 80  watts in the current mode the myoma was excised at its base and passed off the operative field.  Inspection of the rest of the uterine cavity did  not demonstrate any additional submucous myoma or any other lesions, and the resectoscope was removed and a roller ball was attached, and the bases of these attachments with these myomas were catheterized.  Inspection of the rest of the cavity demonstrated normal uterine cavity.  Both tubal ostia were identified and the endocervical canal was clear and smooth.  Of note, the patient had a preoperative endometrial biopsy which did demonstrate proliferative endometrium and a benign endometrial polyp was identified. After this portion of the procedure was completed a Hulka tenaculum was placed for manipulation during the laparoscopic procedure, and a red rubber Roxan Hockey was inserted again and approximately 50 cc evacuated from the bladder. Attention then was placed to the subumbilical region, whereby a small vertical incision about 1.5 cm in size was made.  The incision was carried down through the skin and subcutaneous tissue to the rectus fascia.  The Veress needle was introduced into the intra-abdominal cavity, with opening intra-abdominal pressure reported as 8 mmHg.  Approximately 2.5 L carbon dioxide were insufflated into the abdominal cavity and the Veress needle removed.  The 10 mm trocar was inserted and the sleeve left in place.  The trocar was removed and the operative laparoscope inserted.  Under direct visualization a second stab incision was made 2 cm above the symphysis pubis at its midline and a 5 mm trocar inserted.  The sleeve was left in place.  Inspection of the pelvic cavity was done with the above maintained findings noted.  The right fallopian tube was identified and placed under traction with a self-retaining grasper and a Hulka clip was placed at the proximal one-third portion of the fallopian tube, completely occluding it.  A  similar procedure was carried out on the contralateral side.  Pictures before and after of both procedures were obtained.  The instruments were removed after the carbon dioxide was removed.  The subumbilical fascial incision was closed with figure-of-eight sutures DD:  09/06/99 TD:  09/06/99 Job: [id number]

## 2010-05-26 NOTE — H&P (Signed)
[facility]  Patient:    Traci [name], Traci [name]                          MRN: [id number] Adm. Date:  09/06/99                         History and Physical  CHIEF COMPLAINT: 1. Menometrorrhagia. 2. Prolapsing submucous myoma. 3. Intracavitary submucous myoma. 4. Request elective permanent sterilization.  HISTORY:  The patient is a 50 year old gravida 2, para 2, presented to the office on August 13 for her annual gynecological examination.  She had been on Ortho-Novum 777 and at the time of the visit, she was instructed to discontinue due to the fact that she is greater than 85 years of age. Patient, at the time of the visit, had voiced that she was having menometrorrhagia and at the time of examination of the pelvic exam, it was noticed she had a prolapsing submucous myoma with a long stock.  It was decided to follow this up with a sonohysterogram which was done on August 24. The uterus measured 9.6 x 5.1 x 5.8 cm with a winding of the cervical os due to the soft tissue mass of the prolapsing submucous myoma.  The endometrial stripe was 8.4 mm, and her last menstrual period had been on August 12.  She was approaching mid-cycle.  There were some intramural fibroids, posterior uterine wall, measuring 11 x 10-mm and another one 12 x 10, and another one 26 x 24-mm, respectively.  Bladder area was normal.  Left ovary was normal. There was no fluid in the cul-de-sac.  At the saline infusion, the sonohysterogram showed two echogenic soft tissue polyps, submucous myoma, one in the upper cavity that appeared to be attached to the fundal endometrium measuring 23 x 18 x 11-mm.  The second one with the most long stock attached to the uppermost cervical canal, prolapsing to the cervical canal, measuring 41 x 13-mm.  The patient also, at the time of the visit, had voiced that she wanted to have elective permanent sterilization and was not interested in having any more  children.  She did have a Pap smear done on that day which demonstrated neoplasia not otherwise specified, and in discussion with Dr. Esther Hardy, the pathologist, on that date of August 24, it became more clear that the CT scan was consistent with what we were finding from the prolapsing submucous myoma.  There were groups of spindle cells that were very cellular and showed reparative changes with cytological atypia.  These findings correlated with the samples from the leiomyoma although the groups were reported to be cellular.  She had an endometrial biopsy which was pending at the time of this dictation.  PAST MEDICAL HISTORY:  Patient with two normal spontaneous vaginal deliveries.  MEDICATIONS:  Ortho-Novum 777.  ALLERGIES:  PENICILLIN.  SOCIAL HISTORY:  Alcohol use on a social basis.  Caffeine daily.  REVIEW OF SYSTEMS:  Cycle was reported to be very heavy, most recently, within the menstrual bleeding.  FAMILY HISTORY:  Consists of diabetes in her father, high blood pressure in her mother, cardiovascular disease mother and father.  Breast cancer in her mother and also prostate cancer in her father.  She has three brothers and three sisters.  PHYSICAL EXAMINATION:  Well-developed, well-nourished female.  HEENT:  Unremarkable.  NECK:  Supple.  Trachea midline.  No carotid bruits.  No thyromegaly.  LUNGS:  Clear to auscultation without rhonchi or wheezes.  HEART:  Regular rate and rhythm.  No murmur, rubs, or gallops.  BREAST EXAMINATION:  Was carried out in a sitting supine position.  Both breasts are symmetrical and pendulous.  No skin discoloration or nipple inversion.  No palpable masses or tenderness.  No supraclavicular tenderness or lymphadenopathy.  ABDOMEN:  Soft, nontender without rebound or guarding.  PELVIC:  Bartholins, urethra and Skenes within normal limits.  Vagina and cervix, no lesion or discharge.  Cervix with evidence of a prolapsing submucous myoma  distending the external cervical os, measured by 2 cm in size with a broad-based stock.  The uterus ______ are normal.  Adnexa, no masses or tenderness.  RECTAL EXAM:  Unremarkable.  ASSESSMENT:  A 50 year old gravida 2, para 2 with prolapsing submucous myoma and sonohysterogram demonstrating an additional submucous myoma in the fundal region in the uterus versus a polyp.  This may have contributing to patients menometrorrhagia.  Results of the endometrial biopsy pending at the time of this dictation.  Will obtain this before her surgery, if at all possible.  The surgical case is not starting until 8:45.  Also, the patients request for elective permanent sterilization.  Risks, benefits, pros and cons of laparoscopic surgery to include infection, bleeding, trauma to internal organs were discussed.  The patient understands it is a permanent procedure.  She will not be able to have any more children.  Also, in the event of technical difficulty, an open laparotomy technique will be utilized to gain access to the tubes and complete the sterilization procedure or to correct any internal trauma which may occur at the time of the laparoscopic procedure.  Also, in the event of perforation with hysteroscopic instruments.  She is aware also of an open laparotomy technique being needed to be utilized to make any repair of any damages if it cannot be controlled laparoscopically.  All her questions were answered and will follow accordingly.  PLANS:  The patient is scheduled for resectoscopic myomectomy and laparoscopic sterilization procedure, Hulka clip technique, tomorrow, September 06, 1999, at [facility]. DD:  09/05/99 TD:  09/05/99 Job: [phone]

## 2010-07-25 ENCOUNTER — Other Ambulatory Visit: Payer: Self-pay | Admitting: Internal Medicine

## 2010-07-30 ENCOUNTER — Encounter: Payer: Self-pay | Admitting: Internal Medicine

## 2010-07-30 DIAGNOSIS — Z0000 Encounter for general adult medical examination without abnormal findings: Secondary | ICD-10-CM

## 2010-07-30 DIAGNOSIS — Z0001 Encounter for general adult medical examination with abnormal findings: Secondary | ICD-10-CM | POA: Insufficient documentation

## 2010-07-30 HISTORY — DX: Encounter for general adult medical examination without abnormal findings: Z00.00

## 2010-08-01 ENCOUNTER — Other Ambulatory Visit (INDEPENDENT_AMBULATORY_CARE_PROVIDER_SITE_OTHER): Payer: 59

## 2010-08-01 ENCOUNTER — Other Ambulatory Visit: Payer: Self-pay | Admitting: Internal Medicine

## 2010-08-01 LAB — LIPID PANEL
LDL Cholesterol: 63 mg/dL (ref 0–99)
Total CHOL/HDL Ratio: 3
Triglycerides: 146 mg/dL (ref 0.0–149.0)

## 2010-08-01 LAB — BASIC METABOLIC PANEL
Chloride: 106 mEq/L (ref 96–112)
Creatinine, Ser: 0.6 mg/dL (ref 0.4–1.2)
GFR: 106.63 mL/min (ref >60.00)

## 2010-08-08 ENCOUNTER — Encounter: Payer: Self-pay | Admitting: Internal Medicine

## 2010-08-08 ENCOUNTER — Ambulatory Visit (INDEPENDENT_AMBULATORY_CARE_PROVIDER_SITE_OTHER): Payer: 59 | Admitting: Internal Medicine

## 2010-08-08 VITALS — BP 122/78 | HR 81 | Temp 99.0°F | Ht 62.0 in | Wt 173.4 lb

## 2010-08-08 DIAGNOSIS — E785 Hyperlipidemia, unspecified: Secondary | ICD-10-CM

## 2010-08-08 DIAGNOSIS — I10 Essential (primary) hypertension: Secondary | ICD-10-CM

## 2010-08-08 DIAGNOSIS — E119 Type 2 diabetes mellitus without complications: Secondary | ICD-10-CM

## 2010-08-08 DIAGNOSIS — Z0000 Encounter for general adult medical examination without abnormal findings: Secondary | ICD-10-CM

## 2010-08-08 NOTE — Assessment & Plan Note (Signed)
Mild uncontrolled,  Cont meds as is for now,per pt preference  To work on diet, exercise, wt loss,  And f/u next visit, also refer to DM education  Lab Results  Component Value Date   HGBA1C 7.3* 08/01/2010

## 2010-08-08 NOTE — Assessment & Plan Note (Signed)
stable overall by hx and exam, most recent data reviewed with pt, and pt to continue medical treatment as before  Lab Results  Component Value Date   LDLCALC 63 08/01/2010

## 2010-08-08 NOTE — Assessment & Plan Note (Signed)
stable overall by hx and exam, most recent data reviewed with pt, and pt to continue medical treatment as before  BP Readings from Last 3 Encounters:  08/08/10 122/78  03/23/10 114/70  02/07/10 120/72

## 2010-08-08 NOTE — Progress Notes (Signed)
Subjective:    Patient ID: Traci [name], female    DOB: [birth year], 50 y.o.   MRN: [id number]  HPI  Here to f/u; overall doing ok,  Pt denies chest pain, increased sob or doe, wheezing, orthopnea, PND, increased LE swelling, palpitations, dizziness or syncope.  Pt denies new neurological symptoms such as new headache, or facial or extremity weakness or numbness   Pt denies polydipsia, polyuria, or low sugar symptoms such as weakness or confusion improved with po intake.  Pt states overall good compliance with meds, trying to follow lower cholesterol, diabetic diet, wt overall stable but little exercise however, except for 3 Times per wk at the gym with a friend.   Has not been to DM education Past Medical History  Diagnosis Date  . ALLERGIC RHINITIS 07/07/2008  . CONTACT DERMATITIS 11/30/2009  . DIABETES MELLITUS, TYPE II 11/29/2006  . Dizziness and giddiness 04/26/2007  . HYPERLIPIDEMIA 01/07/2007  . HYPERTENSION 07/08/2007  . PLANTAR FASCIITIS, LEFT 08/05/2007  . Preventative health care 07/30/2010   Past Surgical History  Procedure Date  . Tubal ligation     reports that she has never smoked. She does not have any smokeless tobacco history on file. She reports that she drinks alcohol. She reports that she does not use illicit drugs. family history includes Cancer in her father and mother; Diabetes in her father; and Stroke in her father and mother. Allergies  Allergen Reactions  . Penicillins     REACTION: Rash   Current Outpatient Prescriptions on File Prior to Visit  Medication Sig Dispense Refill  . aspirin 81 MG tablet Take 81 mg by mouth daily.        . fexofenadine (ALLEGRA) 180 MG tablet Take 180 mg by mouth daily.        . fluticasone (FLONASE) 50 MCG/ACT nasal spray Place 2 sprays into the nose daily.        Marland Kitchen glimepiride (AMARYL) 1 MG tablet 1/2 by mouth two times a day (noon and later with evening meal)       . glucose blood (ONE TOUCH ULTRA TEST) test strip Use as  directed two times a day       . HYDROcodone-homatropine (HYCODAN) 5-1.5 MG/5ML syrup 1 teaspoon by mouth every 6 hours as needed for cough       . Lancets (ONETOUCH ULTRASOFT) lancets Use as directed two times a day       . levofloxacin (LEVAQUIN) 500 MG tablet Take 500 mg by mouth daily.        Marland Kitchen lisinopril (PRINIVIL,ZESTRIL) 5 MG tablet TAKE ONE TABLET BY MOUTH EVERY DAY  90 tablet  3  . metFORMIN (GLUCOPHAGE) 1000 MG tablet Take 1,000 mg by mouth 2 (two) times daily.        . pravastatin (PRAVACHOL) 40 MG tablet TAKE ONE TABLET BY MOUTH EVERY DAY  90 tablet  3   Review of Systems Review of Systems  Constitutional: Negative for diaphoresis and unexpected weight change.  HENT: Negative for drooling and tinnitus.   Eyes: Negative for photophobia and visual disturbance.  Respiratory: Negative for choking and stridor.   Gastrointestinal: Negative for vomiting and blood in stool.     Objective:   Physical Exam BP 122/78  Pulse 81  Temp(Src) 99 F (37.2 C) (Oral)  Ht 5\' 2"  (1.575 m)  Wt 173 lb 6.4 oz (78.654 kg)  BMI 31.72 kg/m2  SpO2 98% Physical Exam  VS noted Constitutional: Pt appears well-developed and well-nourished.  HENT: Head: Normocephalic.  Right Ear: External ear normal.  Left Ear: External ear normal.  Eyes: Conjunctivae and EOM are normal. Pupils are equal, round, and reactive to light.  Neck: Normal range of motion. Neck supple.  Cardiovascular: Normal rate and regular rhythm.   Pulmonary/Chest: Effort normal and breath sounds normal.  Neurological: Pt is alert. No cranial nerve deficit.  Skin: Skin is warm. No erythema.  Psychiatric: Pt behavior is normal. Thought content normal.         Assessment & Plan:   No problem-specific assessment & plan notes found for this encounter.

## 2010-08-08 NOTE — Patient Instructions (Signed)
Continue all other medications as before You will be contacted regarding the referral for: Diabetes Education class Please return in 6 mo with Lab testing done 3-5 days before

## 2010-08-24 ENCOUNTER — Encounter: Payer: Self-pay | Admitting: Gynecology

## 2010-08-29 ENCOUNTER — Encounter: Payer: Self-pay | Admitting: Internal Medicine

## 2010-11-21 ENCOUNTER — Other Ambulatory Visit: Payer: Self-pay | Admitting: Internal Medicine

## 2011-03-12 ENCOUNTER — Other Ambulatory Visit: Payer: Self-pay | Admitting: Internal Medicine

## 2011-03-26 ENCOUNTER — Ambulatory Visit (INDEPENDENT_AMBULATORY_CARE_PROVIDER_SITE_OTHER): Payer: BC Managed Care – PPO | Admitting: Internal Medicine

## 2011-03-26 ENCOUNTER — Encounter: Payer: Self-pay | Admitting: Internal Medicine

## 2011-03-26 VITALS — BP 128/80 | HR 79 | Temp 98.5°F | Ht 62.0 in | Wt 177.0 lb

## 2011-03-26 DIAGNOSIS — J019 Acute sinusitis, unspecified: Secondary | ICD-10-CM | POA: Insufficient documentation

## 2011-03-26 DIAGNOSIS — R1110 Vomiting, unspecified: Secondary | ICD-10-CM | POA: Insufficient documentation

## 2011-03-26 DIAGNOSIS — R112 Nausea with vomiting, unspecified: Secondary | ICD-10-CM

## 2011-03-26 DIAGNOSIS — Z0000 Encounter for general adult medical examination without abnormal findings: Secondary | ICD-10-CM

## 2011-03-26 DIAGNOSIS — E119 Type 2 diabetes mellitus without complications: Secondary | ICD-10-CM

## 2011-03-26 DIAGNOSIS — R42 Dizziness and giddiness: Secondary | ICD-10-CM | POA: Insufficient documentation

## 2011-03-26 MED ORDER — MECLIZINE HCL 12.5 MG PO TABS: 12.5000 mg | ORAL_TABLET | Freq: Three times a day (TID) | ORAL | Status: AC | PRN

## 2011-03-26 MED ORDER — PROMETHAZINE HCL 25 MG PO TABS: 25.0000 mg | ORAL_TABLET | Freq: Four times a day (QID) | ORAL | Status: AC | PRN

## 2011-03-26 MED ORDER — AZITHROMYCIN 250 MG PO TABS: ORAL_TABLET | ORAL | Status: AC

## 2011-03-26 NOTE — Assessment & Plan Note (Signed)
stable overall by hx and exam, most recent data reviewed with pt, and pt to continue medical treatment as before le  Lab Results  Component Value Date   HGBA1C 7.3* 08/01/2010

## 2011-03-26 NOTE — Progress Notes (Signed)
Subjective:    Patient ID: [name], female    DOB: 20-Jul-1960, 51 y.o.   MRN: [id number]  HPI  Here with severe onset episode starting yesterday am with fever, facial pain, pressure, general weakness and malaise, and severe vertigo improved today, but essentially unable to be out of bed yesterday, and Pt denies chest pain, increased sob or doe, wheezing, orthopnea, PND, increased LE swelling, palpitations, or syncope. Pt denies new neurological symptoms such as new headache, or facial or extremity weakness or numbness   Pt denies polydipsia, polyuria.  Overall good compliance with treatment, and good medicine tolerability. Past Medical History  Diagnosis Date  . ALLERGIC RHINITIS 07/07/2008  . CONTACT DERMATITIS 11/30/2009  . DIABETES MELLITUS, TYPE II 11/29/2006  . Dizziness and giddiness 04/26/2007  . HYPERLIPIDEMIA 01/07/2007  . HYPERTENSION 07/08/2007  . PLANTAR FASCIITIS, LEFT 08/05/2007  . Preventative health care 07/30/2010   Past Surgical History  Procedure Date  . Tubal ligation     reports that she has never smoked. She does not have any smokeless tobacco history on file. She reports that she drinks alcohol. She reports that she does not use illicit drugs. family history includes Cancer in her father and mother; Diabetes in her father; and Stroke in her father and mother. Allergies  Allergen Reactions  . Penicillins     REACTION: Rash   Current Outpatient Prescriptions on File Prior to Visit  Medication Sig Dispense Refill  . aspirin 81 MG tablet Take 81 mg by mouth daily.        . fexofenadine (ALLEGRA) 180 MG tablet Take 180 mg by mouth daily.        . fluticasone (FLONASE) 50 MCG/ACT nasal spray Place 2 sprays into the nose daily.        Marland Kitchen glimepiride (AMARYL) 1 MG tablet TAKE ONE TABLET BY MOUTH DAILY AT NOON  30 tablet  5  . glucose blood (ONE TOUCH ULTRA TEST) test strip Use as directed two times a day       . Lancets (ONETOUCH ULTRASOFT) lancets Use as directed  two times a day       . lisinopril (PRINIVIL,ZESTRIL) 5 MG tablet TAKE ONE TABLET BY MOUTH EVERY DAY  90 tablet  3  . metFORMIN (GLUCOPHAGE) 1000 MG tablet TAKE ONE TABLET BY MOUTH TWICE DAILY  60 tablet  5  . pravastatin (PRAVACHOL) 40 MG tablet TAKE ONE TABLET BY MOUTH EVERY DAY  90 tablet  3  . HYDROcodone-homatropine (HYCODAN) 5-1.5 MG/5ML syrup 1 teaspoon by mouth every 6 hours as needed for cough       . levofloxacin (LEVAQUIN) 500 MG tablet Take 500 mg by mouth daily.         Review of Systems Review of Systems  Constitutional: Negative for diaphoresis and unexpected weight change.  HENT: Negative for drooling and tinnitus.   Eyes: Negative for photophobia and visual disturbance.  Respiratory: Negative for choking and stridor.   Gastrointestinal: Negative for vomiting and blood in stool.  Genitourinary: Negative for hematuria and decreased urine volume.  Musculoskeletal: Negative for gait problem.  Skin: Negative for color change and wound. No rash    Objective:   Physical Exam BP 128/80  Pulse 79  Temp(Src) 98.5 F (36.9 C) (Oral)  Ht 5\' 2"  (1.575 m)  Wt 177 lb (80.287 kg)  BMI 32.37 kg/m2  SpO2 97% Physical Exam  VS noted, mild ill Constitutional: Pt appears well-developed and well-nourished.  HENT: Head: Normocephalic.  Right  Ear: External ear normal.  Left Ear: External ear normal.  Bilat tm's mild erythema.  Sinus tender.  Pharynx mild erythema Eyes: Conjunctivae and EOM are normal. Pupils are equal, round, and reactive to light.  Neck: Normal range of motion. Neck supple.  Cardiovascular: Normal rate and regular rhythm.   Pulmonary/Chest: Effort normal and breath sounds normal.  Abd:  Soft, NT, non-distended, + BS Neurological: Pt is alert. No cranial nerve deficit.  Skin: Skin is warm. No erythema. No rash Psychiatric: Pt behavior is normal. Thought content normal. Mild nervous    Assessment & Plan:

## 2011-03-26 NOTE — Assessment & Plan Note (Signed)
liekly related to the vertigo- for phenergan prn, clearl lquids until improved

## 2011-03-26 NOTE — Assessment & Plan Note (Signed)
Mild to mod, for antibx course,  to f/u any worsening symptoms or concerns 

## 2011-03-26 NOTE — Patient Instructions (Addendum)
Take all new medications as prescribed Continue all other medications as before You are given the work note Please return in 3 mo with Lab testing done 3-5 days before

## 2011-03-26 NOTE — Assessment & Plan Note (Signed)
Likely due to middle ear involvement related to acute illness, for meclizine adn mucinex otc prn,  to f/u any worsening symptoms or concerns j

## 2011-07-10 ENCOUNTER — Telehealth: Payer: Self-pay

## 2011-07-10 DIAGNOSIS — Z0000 Encounter for general adult medical examination without abnormal findings: Secondary | ICD-10-CM

## 2011-07-10 NOTE — Telephone Encounter (Signed)
Put lab order in. 

## 2011-07-18 ENCOUNTER — Other Ambulatory Visit: Payer: Self-pay | Admitting: Internal Medicine

## 2011-07-18 ENCOUNTER — Other Ambulatory Visit (INDEPENDENT_AMBULATORY_CARE_PROVIDER_SITE_OTHER): Payer: BC Managed Care – PPO

## 2011-07-18 DIAGNOSIS — Z0000 Encounter for general adult medical examination without abnormal findings: Secondary | ICD-10-CM

## 2011-07-18 DIAGNOSIS — E119 Type 2 diabetes mellitus without complications: Secondary | ICD-10-CM

## 2011-07-18 LAB — HEPATIC FUNCTION PANEL
AST: 104 U/L — ABNORMAL HIGH (ref 0–37)
Alkaline Phosphatase: 63 U/L (ref 39–117)
Bilirubin, Direct: 0.1 mg/dL (ref 0.0–0.3)
Total Bilirubin: 0.3 mg/dL (ref 0.3–1.2)

## 2011-07-18 LAB — BASIC METABOLIC PANEL
BUN: 12 mg/dL (ref 6–23)
CO2: 25 mEq/L (ref 19–32)
Calcium: 9.6 mg/dL (ref 8.4–10.5)
Chloride: 102 mEq/L (ref 96–112)
Creatinine, Ser: 0.6 mg/dL (ref 0.4–1.2)
Glucose, Bld: 258 mg/dL — ABNORMAL HIGH (ref 70–99)

## 2011-07-18 LAB — CBC WITH DIFFERENTIAL/PLATELET
Basophils Absolute: 0 10*3/uL (ref 0.0–0.1)
Eosinophils Absolute: 0.2 10*3/uL (ref 0.0–0.7)
Hemoglobin: 13.8 g/dL (ref 12.0–15.0)
Lymphocytes Relative: 31.2 % (ref 12.0–46.0)
MCHC: 33.1 g/dL (ref 30.0–36.0)
Monocytes Relative: 6 % (ref 3.0–12.0)
Neutrophils Relative %: 59.8 % (ref 43.0–77.0)
RDW: 13.4 % (ref 11.5–14.6)

## 2011-07-18 LAB — TSH: TSH: 1.82 u[IU]/mL (ref 0.35–5.50)

## 2011-07-18 LAB — URINALYSIS, ROUTINE W REFLEX MICROSCOPIC
Hgb urine dipstick: NEGATIVE
Ketones, ur: NEGATIVE
Urine Glucose: 250
Urobilinogen, UA: 0.2 (ref 0.0–1.0)

## 2011-07-18 LAB — LIPID PANEL
LDL Cholesterol: 76 mg/dL (ref 0–99)
Total CHOL/HDL Ratio: 4
Triglycerides: 196 mg/dL — ABNORMAL HIGH (ref 0.0–149.0)

## 2011-07-18 LAB — MICROALBUMIN / CREATININE URINE RATIO
Creatinine,U: 131.2 mg/dL
Microalb, Ur: 0.8 mg/dL (ref 0.0–1.9)

## 2011-07-19 ENCOUNTER — Ambulatory Visit: Payer: BC Managed Care – PPO

## 2011-07-20 LAB — HEPATITIS PANEL, ACUTE
Hep A IgM: NEGATIVE
Hep B C IgM: NEGATIVE
Hepatitis B Surface Ag: NEGATIVE

## 2011-07-25 ENCOUNTER — Encounter: Payer: Self-pay | Admitting: Internal Medicine

## 2011-07-25 ENCOUNTER — Ambulatory Visit (INDEPENDENT_AMBULATORY_CARE_PROVIDER_SITE_OTHER): Payer: BC Managed Care – PPO | Admitting: Internal Medicine

## 2011-07-25 VITALS — BP 132/76 | HR 95 | Temp 97.8°F | Ht 62.5 in | Wt 175.0 lb

## 2011-07-25 DIAGNOSIS — Z0000 Encounter for general adult medical examination without abnormal findings: Secondary | ICD-10-CM

## 2011-07-25 DIAGNOSIS — M21619 Bunion of unspecified foot: Secondary | ICD-10-CM

## 2011-07-25 DIAGNOSIS — R7989 Other specified abnormal findings of blood chemistry: Secondary | ICD-10-CM

## 2011-07-25 DIAGNOSIS — E119 Type 2 diabetes mellitus without complications: Secondary | ICD-10-CM

## 2011-07-25 DIAGNOSIS — M21611 Bunion of right foot: Secondary | ICD-10-CM

## 2011-07-25 DIAGNOSIS — R945 Abnormal results of liver function studies: Secondary | ICD-10-CM | POA: Insufficient documentation

## 2011-07-25 MED ORDER — PIOGLITAZONE HCL 30 MG PO TABS: 30.0000 mg | ORAL_TABLET | Freq: Every day | ORAL | Status: DC

## 2011-07-25 NOTE — Patient Instructions (Addendum)
Take all new medications as prescribed - the generic actos Continue all other medications as before, except to stop the glimeparide Please have the pharmacy call with any refills you may need. You will be contacted regarding the referral for: abdomen ultrasound You will be contacted regarding the referral for: podiatry, and colonoscopy Please return in 6 mo with Lab testing done 3-5 days before

## 2011-07-25 NOTE — Assessment & Plan Note (Addendum)
Overall doing well, age appropriate education and counseling updated, referrals for preventative services and immunizations addressed, dietary and smoking counseling addressed, most recent labs and ECG reviewed.  I have personally reviewed and have noted: 1) the patient's medical and social history 2) The pt's use of alcohol, tobacco, and illicit drugs 3) The patient's current medications and supplements 4) Functional ability including ADL's, fall risk, home safety risk, hearing and visual impairment 5) Diet and physical activities 6) Evidence for depression or mood disorder 7) The patient's height, weight, and BMI have been recorded in the chart I have made referrals, and provided counseling and education based on review of the above ECG reviewed as per emr, for colonoscopy as she is due for screening

## 2011-07-29 ENCOUNTER — Encounter: Payer: Self-pay | Admitting: Internal Medicine

## 2011-07-29 NOTE — Assessment & Plan Note (Signed)
Asympt, for u/s eval

## 2011-07-29 NOTE — Assessment & Plan Note (Signed)
Uncontrolled, to change to actos 30 qd,  to f/u any worsening symptoms or concerns Lab Results  Component Value Date   HGBA1C 10.0* 07/18/2011

## 2011-07-29 NOTE — Assessment & Plan Note (Signed)
Ok to refer to podiatry,  to f/u any worsening symptoms or concerns

## 2011-07-29 NOTE — Progress Notes (Signed)
Subjective:    Patient ID: Traci [name], female    DOB: 07-15-1960, 51 y.o.   MRN: [id number]  HPI  Here for wellness and f/u;  Overall doing ok;  Pt denies CP, worsening SOB, DOE, wheezing, orthopnea, PND, worsening LE edema, palpitations, dizziness or syncope.  Pt denies neurological change such as new Headache, facial or extremity weakness.  Pt denies polydipsia, polyuria, or low sugar symptoms. Pt states overall good compliance with treatment and medications, good tolerability, and trying to follow lower cholesterol diet.  Pt denies worsening depressive symptoms, suicidal ideation or panic. No fever, wt loss, night sweats, loss of appetite, or other constitutional symptoms.  Pt states good ability with ADL's, low fall risk, home safety reviewed and adequate, no significant changes in hearing or vision, and occasionally active with exercise.  Due for colonoscopy.  Denies worsening reflux, dysphagia, abd pain, n/v, bowel change or blood.  Has painful right bunion, needs podiatry. Past Medical History  Diagnosis Date  . ALLERGIC RHINITIS 07/07/2008  . CONTACT DERMATITIS 11/30/2009  . DIABETES MELLITUS, TYPE II 11/29/2006  . Dizziness and giddiness 04/26/2007  . HYPERLIPIDEMIA 01/07/2007  . HYPERTENSION 07/08/2007  . PLANTAR FASCIITIS, LEFT 08/05/2007  . Preventative health care 07/30/2010   Past Surgical History  Procedure Date  . Tubal ligation     reports that she has never smoked. She does not have any smokeless tobacco history on file. She reports that she drinks alcohol. She reports that she does not use illicit drugs. family history includes Cancer in her father and mother; Diabetes in her father; and Stroke in her father and mother. Allergies  Allergen Reactions  . Penicillins     REACTION: Rash   Current Outpatient Prescriptions on File Prior to Visit  Medication Sig Dispense Refill  . aspirin 81 MG tablet Take 81 mg by mouth daily.        . fexofenadine (ALLEGRA) 180 MG tablet  Take 180 mg by mouth daily.        . fluticasone (FLONASE) 50 MCG/ACT nasal spray Place 2 sprays into the nose daily.        Marland Kitchen glucose blood (ONE TOUCH ULTRA TEST) test strip Use as directed two times a day       . Lancets (ONETOUCH ULTRASOFT) lancets Use as directed two times a day       . lisinopril (PRINIVIL,ZESTRIL) 5 MG tablet TAKE ONE TABLET BY MOUTH EVERY DAY.  90 tablet  2  . metFORMIN (GLUCOPHAGE) 1000 MG tablet TAKE ONE TABLET BY MOUTH TWICE DAILY  60 tablet  5  . pravastatin (PRAVACHOL) 40 MG tablet TAKE ONE TABLET BY MOUTH EVERY DAY.  90 tablet  2  . HYDROcodone-homatropine (HYCODAN) 5-1.5 MG/5ML syrup 1 teaspoon by mouth every 6 hours as needed for cough       . levofloxacin (LEVAQUIN) 500 MG tablet Take 500 mg by mouth daily.        . pioglitazone (ACTOS) 30 MG tablet Take 1 tablet (30 mg total) by mouth daily.  90 tablet  3   Review of Systems Review of Systems  Constitutional: Negative for diaphoresis, activity change, appetite change and unexpected weight change.  HENT: Negative for hearing loss, ear pain, facial swelling, mouth sores and neck stiffness.   Eyes: Negative for pain, redness and visual disturbance.  Respiratory: Negative for shortness of breath and wheezing.   Cardiovascular: Negative for chest pain and palpitations.  Gastrointestinal: Negative for diarrhea, blood in stool, abdominal distention  and rectal pain.  Genitourinary: Negative for hematuria, flank pain and decreased urine volume.  Musculoskeletal: Negative for myalgias and joint swelling.  Skin: Negative for color change and wound.  Neurological: Negative for syncope and numbness.  Hematological: Negative for adenopathy.  Psychiatric/Behavioral: Negative for hallucinations, self-injury, decreased concentration and agitation.     Objective:   Physical Exam BP 132/76  Pulse 95  Temp 97.8 F (36.6 C) (Oral)  Ht 5' 2.5" (1.588 m)  Wt 175 lb (79.379 kg)  BMI 31.50 kg/m2  SpO2 97% Physical Exam   VS noted Constitutional: Pt is oriented to person, place, and time. Appears well-developed and well-nourished.  HENT:  Head: Normocephalic and atraumatic.  Right Ear: External ear normal.  Left Ear: External ear normal.  Nose: Nose normal.  Mouth/Throat: Oropharynx is clear and moist.  Eyes: Conjunctivae and EOM are normal. Pupils are equal, round, and reactive to light.  Neck: Normal range of motion. Neck supple. No JVD present. No tracheal deviation present.  Cardiovascular: Normal rate, regular rhythm, normal heart sounds and intact distal pulses.   Pulmonary/Chest: Effort normal and breath sounds normal.  Abdominal: Soft. Bowel sounds are normal. There is no tenderness.  Musculoskeletal: Normal range of motion. Exhibits no edema.  Lymphadenopathy:  Has no cervical adenopathy.  Neurological: Pt is alert and oriented to person, place, and time. Pt has normal reflexes. No cranial nerve deficit.  Skin: Skin is warm and dry. No rash noted. Right bunion mild red, tender.   Psychiatric:  Has  normal mood and affect. Behavior is normal.     Assessment & Plan:

## 2011-08-01 ENCOUNTER — Other Ambulatory Visit: Payer: BC Managed Care – PPO

## 2011-08-06 ENCOUNTER — Encounter: Payer: Self-pay | Admitting: Gynecology

## 2011-08-17 ENCOUNTER — Encounter: Payer: Self-pay | Admitting: Internal Medicine

## 2011-08-27 ENCOUNTER — Encounter: Payer: Self-pay | Admitting: Gastroenterology

## 2011-09-19 ENCOUNTER — Other Ambulatory Visit: Payer: Self-pay | Admitting: Internal Medicine

## 2011-10-04 ENCOUNTER — Ambulatory Visit (INDEPENDENT_AMBULATORY_CARE_PROVIDER_SITE_OTHER): Payer: BC Managed Care – PPO | Admitting: Internal Medicine

## 2011-10-04 ENCOUNTER — Encounter: Payer: Self-pay | Admitting: Internal Medicine

## 2011-10-04 VITALS — BP 132/82 | HR 71 | Temp 97.6°F | Ht 62.0 in | Wt 173.1 lb

## 2011-10-04 DIAGNOSIS — E785 Hyperlipidemia, unspecified: Secondary | ICD-10-CM

## 2011-10-04 DIAGNOSIS — E119 Type 2 diabetes mellitus without complications: Secondary | ICD-10-CM

## 2011-10-04 DIAGNOSIS — I10 Essential (primary) hypertension: Secondary | ICD-10-CM

## 2011-10-04 DIAGNOSIS — J069 Acute upper respiratory infection, unspecified: Secondary | ICD-10-CM | POA: Insufficient documentation

## 2011-10-04 DIAGNOSIS — J019 Acute sinusitis, unspecified: Secondary | ICD-10-CM

## 2011-10-04 MED ORDER — AZITHROMYCIN 250 MG PO TABS: ORAL_TABLET | ORAL | Status: DC

## 2011-10-04 MED ORDER — GLUCOSE BLOOD VI STRP: ORAL_STRIP | Status: DC

## 2011-10-04 NOTE — Patient Instructions (Addendum)
Take all new medications as prescribed Continue all other medications as before  

## 2011-10-06 ENCOUNTER — Encounter: Payer: Self-pay | Admitting: Internal Medicine

## 2011-10-06 NOTE — Assessment & Plan Note (Signed)
Mild to mod, for antibx course,  to f/u any worsening symptoms or concerns 

## 2011-10-06 NOTE — Progress Notes (Signed)
Subjective:    Patient ID: Traci [name], female    DOB: 22-Feb-1960, 51 y.o.   MRN: [id number]  HPI   Here with 3 days acute onset fever, facial pain, pressure, general weakness and malaise, and greenish d/c, with mild ST and bilat hear aching, but little to no cough and Pt denies chest pain, increased sob or doe, wheezing, orthopnea, PND, increased LE swelling, palpitations, dizziness or syncope. Pt denies new neurological symptoms such as new headache, or facial or extremity weakness or numbness   Pt denies polydipsia, polyuria, or low sugar symptoms such as weakness or confusion improved with po intake.  Pt states overall good compliance with meds, trying to follow lower cholesterol, diabetic diet, wt overall stable but little exercise however. Past Medical History  Diagnosis Date  . ALLERGIC RHINITIS 07/07/2008  . CONTACT DERMATITIS 11/30/2009  . DIABETES MELLITUS, TYPE II 11/29/2006  . Dizziness and giddiness 04/26/2007  . HYPERLIPIDEMIA 01/07/2007  . HYPERTENSION 07/08/2007  . PLANTAR FASCIITIS, LEFT 08/05/2007  . Preventative health care 07/30/2010   Past Surgical History  Procedure Date  . Tubal ligation     reports that she has never smoked. She does not have any smokeless tobacco history on file. She reports that she drinks alcohol. She reports that she does not use illicit drugs. family history includes Cancer in her father and mother; Diabetes in her father; and Stroke in her father and mother. Allergies  Allergen Reactions  . Penicillins     REACTION: Rash   Current Outpatient Prescriptions on File Prior to Visit  Medication Sig Dispense Refill  . aspirin 81 MG tablet Take 81 mg by mouth daily.        . fexofenadine (ALLEGRA) 180 MG tablet Take 180 mg by mouth daily.        . fluticasone (FLONASE) 50 MCG/ACT nasal spray Place 2 sprays into the nose daily.        . Lancets (ONETOUCH ULTRASOFT) lancets Use as directed two times a day       . lisinopril (PRINIVIL,ZESTRIL) 5 MG  tablet TAKE ONE TABLET BY MOUTH EVERY DAY.  90 tablet  2  . metFORMIN (GLUCOPHAGE) 1000 MG tablet TAKE ONE TABLET BY MOUTH TWICE DAILY  60 tablet  4  . pioglitazone (ACTOS) 30 MG tablet Take 1 tablet (30 mg total) by mouth daily.  90 tablet  3  . pravastatin (PRAVACHOL) 40 MG tablet TAKE ONE TABLET BY MOUTH EVERY DAY.  90 tablet  2  . HYDROcodone-homatropine (HYCODAN) 5-1.5 MG/5ML syrup 1 teaspoon by mouth every 6 hours as needed for cough       . levofloxacin (LEVAQUIN) 500 MG tablet Take 500 mg by mouth daily.         Review of Systems  Constitutional: Negative for diaphoresis and unexpected weight change.  HENT: Negative for tinnitus.   Eyes: Negative for photophobia and visual disturbance.  Respiratory: Negative for choking and stridor.   Gastrointestinal: Negative for vomiting and blood in stool.  Genitourinary: Negative for hematuria and decreased urine volume.  Musculoskeletal: Negative for gait problem.  Skin: Negative for color change and wound.  Neurological: Negative for tremors and numbness.  Psychiatric/Behavioral: Negative for decreased concentration. The patient is not hyperactive.       Objective:   Physical Exam BP 132/82  Pulse 71  Temp 97.6 F (36.4 C) (Oral)  Ht 5\' 2"  (1.575 m)  Wt 173 lb 2 oz (78.529 kg)  BMI 31.66 kg/m2  SpO2  99% Physical Exam  VS noted, mild ill Constitutional: Pt appears well-developed and well-nourished.  HENT: Head: Normocephalic.  Right Ear: External ear normal.  Left Ear: External ear normal.  Bilat tm's mild erythema.  Sinus nontender.  Pharynx mild erythema Eyes: Conjunctivae and EOM are normal. Pupils are equal, round, and reactive to light.  Neck: Normal range of motion. Neck supple.  Cardiovascular: Normal rate and regular rhythm.   Pulmonary/Chest: Effort normal and breath sounds normal.  Neurological: Pt is alert. Not confused  Skin: Skin is warm. No erythema.  Psychiatric: Pt behavior is normal. Thought content normal.      Assessment & Plan:

## 2011-10-06 NOTE — Assessment & Plan Note (Signed)
stable overall by hx and exam, most recent data reviewed with pt, and pt to continue medical treatment as before BP Readings from Last 3 Encounters:  10/04/11 132/82  07/25/11 132/76  03/26/11 128/80

## 2011-10-06 NOTE — Assessment & Plan Note (Signed)
stable overall by hx and exam, most recent data reviewed with pt, and pt to continue medical treatment as before Lab Results  Component Value Date   HGBA1C 10.0* 07/18/2011

## 2011-10-06 NOTE — Assessment & Plan Note (Signed)
stable overall by hx and exam, most recent data reviewed with pt, and pt to continue medical treatment as before Lab Results  Component Value Date   LDLCALC 76 07/18/2011

## 2011-10-18 ENCOUNTER — Ambulatory Visit (INDEPENDENT_AMBULATORY_CARE_PROVIDER_SITE_OTHER): Payer: BC Managed Care – PPO | Admitting: Internal Medicine

## 2011-10-18 ENCOUNTER — Encounter: Payer: Self-pay | Admitting: Internal Medicine

## 2011-10-18 VITALS — BP 132/84 | HR 84 | Temp 99.3°F | Ht 62.0 in | Wt 177.0 lb

## 2011-10-18 DIAGNOSIS — E119 Type 2 diabetes mellitus without complications: Secondary | ICD-10-CM

## 2011-10-18 DIAGNOSIS — J019 Acute sinusitis, unspecified: Secondary | ICD-10-CM

## 2011-10-18 DIAGNOSIS — R42 Dizziness and giddiness: Secondary | ICD-10-CM

## 2011-10-18 DIAGNOSIS — R1110 Vomiting, unspecified: Secondary | ICD-10-CM

## 2011-10-18 MED ORDER — LEVOFLOXACIN 500 MG PO TABS: 500.0000 mg | ORAL_TABLET | Freq: Every day | ORAL | Status: DC

## 2011-10-18 MED ORDER — MECLIZINE HCL 12.5 MG PO TABS: 12.5000 mg | ORAL_TABLET | Freq: Three times a day (TID) | ORAL | Status: DC | PRN

## 2011-10-18 NOTE — Assessment & Plan Note (Signed)
liekly due to right ear issue, for meclizine prn, mucinex d prn,  to f/u any worsening symptoms or concerns

## 2011-10-18 NOTE — Progress Notes (Signed)
Subjective:    Patient ID: Traci [name], female    DOB: [birth year], 51 y.o.   MRN: [id number]  HPI   Here with 3 days acute onset fever, facial pain, pressure, general weakness and malaise, and greenish d/c, with slight ST, but little to no cough and Pt denies chest pain, increased sob or doe, wheezing, orthopnea, PND, increased LE swelling, palpitations, dizziness or syncope except for several episodes last 2 days with right ear pressure, vertigo and n/v.  No n/v without vertigo, which has been positional.  Pt denies new neurological symptoms such as new headache, or facial or extremity weakness or numbness   Pt denies polydipsia, polyuria, or low sugar symptoms such as weakness or confusion improved with po intake.  Pt states overall good compliance with meds, trying to follow lower cholesterol, diabetic diet, wt overall stable, but cbg's have been occasionally to 200's recently as well Past Medical History  Diagnosis Date  . ALLERGIC RHINITIS 07/07/2008  . CONTACT DERMATITIS 11/30/2009  . DIABETES MELLITUS, TYPE II 11/29/2006  . Dizziness and giddiness 04/26/2007  . HYPERLIPIDEMIA 01/07/2007  . HYPERTENSION 07/08/2007  . PLANTAR FASCIITIS, LEFT 08/05/2007  . Preventative health care 07/30/2010   Past Surgical History  Procedure Date  . Tubal ligation     reports that she has never smoked. She does not have any smokeless tobacco history on file. She reports that she drinks alcohol. She reports that she does not use illicit drugs. family history includes Cancer in her father and mother; Diabetes in her father; and Stroke in her father and mother. Allergies  Allergen Reactions  . Penicillins     REACTION: Rash   Current Outpatient Prescriptions on File Prior to Visit  Medication Sig Dispense Refill  . aspirin 81 MG tablet Take 81 mg by mouth daily.        . fexofenadine (ALLEGRA) 180 MG tablet Take 180 mg by mouth daily.        . fluticasone (FLONASE) 50 MCG/ACT nasal spray Place 2 sprays  into the nose daily.        Marland Kitchen glucose blood test strip Use as instructed  100 each  12  . Lancets (ONETOUCH ULTRASOFT) lancets Use as directed two times a day       . lisinopril (PRINIVIL,ZESTRIL) 5 MG tablet TAKE ONE TABLET BY MOUTH EVERY DAY.  90 tablet  2  . metFORMIN (GLUCOPHAGE) 1000 MG tablet TAKE ONE TABLET BY MOUTH TWICE DAILY  60 tablet  4  . pioglitazone (ACTOS) 30 MG tablet Take 1 tablet (30 mg total) by mouth daily.  90 tablet  3  . pravastatin (PRAVACHOL) 40 MG tablet TAKE ONE TABLET BY MOUTH EVERY DAY.  90 tablet  2   Review of Systems  Constitutional: Negative for diaphoresis and unexpected weight change.  HENT: Negative for tinnitus.   Eyes: Negative for photophobia and visual disturbance.  Respiratory: Negative for choking and stridor.   Gastrointestinal: Negative for blood in stool.  Genitourinary: Negative for hematuria and decreased urine volume.  Musculoskeletal: Negative for gait problem.  Skin: Negative for color change and wound.  Neurological: Negative for tremors and numbness.  Psychiatric/Behavioral: Negative for decreased concentration. The patient is not hyperactive.       Objective:   Physical Exam BP 132/84  Pulse 84  Temp 99.3 F (37.4 C) (Oral)  Ht 5\' 2"  (1.575 m)  Wt 177 lb (80.287 kg)  BMI 32.37 kg/m2  SpO2 97% Physical Exam  VS noted  Constitutional: Pt appears well-developed and well-nourished.  HENT: Head: Normocephalic.  Right Ear: External ear normal.  Left Ear: External ear normal.  Bilat tm's mild to mod erythema, right > left with bulging to the right,  Sinus tender right > left maxillary.  Pharynx mild erythema Eyes: Conjunctivae and EOM are normal. Pupils are equal, round, and reactive to light.  Neck: Normal range of motion. Neck supple.  Cardiovascular: Normal rate and regular rhythm.   Pulmonary/Chest: Effort normal and breath sounds normal.  Abd:  Soft, NT, non-distended, + BS Neurological: Pt is alert. Not confused ,  motor/dtr/gait intact Skin: Skin is warm. No erythema.  Psychiatric: Pt behavior is normal. Thought content normal.     Assessment & Plan:

## 2011-10-18 NOTE — Patient Instructions (Addendum)
Take all new medications as prescribed - the antibiotic, and meclizine for dizziness Continue all other medications as before You can also take Mucinex D (or it's generic off brand) for congestion

## 2011-10-18 NOTE — Assessment & Plan Note (Signed)
stable overall by hx and exam, most recent data reviewed with pt, and pt to continue medical treatment as before, f/u with a1c next visit Lab Results  Component Value Date   HGBA1C 10.0* 07/18/2011

## 2011-10-18 NOTE — Assessment & Plan Note (Signed)
Mild to mod, for antibx course,  to f/u any worsening symptoms or concerns 

## 2011-10-18 NOTE — Assessment & Plan Note (Signed)
Due to the vertigo, to treat primary issue,  to f/u any worsening symptoms or concerns

## 2012-01-18 ENCOUNTER — Other Ambulatory Visit (INDEPENDENT_AMBULATORY_CARE_PROVIDER_SITE_OTHER): Payer: BC Managed Care – PPO

## 2012-01-18 DIAGNOSIS — E119 Type 2 diabetes mellitus without complications: Secondary | ICD-10-CM

## 2012-01-18 LAB — LIPID PANEL
Cholesterol: 151 mg/dL (ref 0–200)
HDL: 43.2 mg/dL (ref >39.00)
LDL Cholesterol: 87 mg/dL (ref 0–99)
Triglycerides: 103 mg/dL (ref 0.0–149.0)
VLDL: 20.6 mg/dL (ref 0.0–40.0)

## 2012-01-18 LAB — BASIC METABOLIC PANEL
BUN: 12 mg/dL (ref 6–23)
Creatinine, Ser: 0.8 mg/dL (ref 0.4–1.2)
GFR: 86.68 mL/min (ref >60.00)

## 2012-01-25 ENCOUNTER — Encounter: Payer: Self-pay | Admitting: Internal Medicine

## 2012-01-25 ENCOUNTER — Ambulatory Visit (INDEPENDENT_AMBULATORY_CARE_PROVIDER_SITE_OTHER): Payer: BC Managed Care – PPO | Admitting: Internal Medicine

## 2012-01-25 VITALS — BP 118/68 | HR 95 | Temp 98.0°F | Wt 179.0 lb

## 2012-01-25 DIAGNOSIS — I10 Essential (primary) hypertension: Secondary | ICD-10-CM

## 2012-01-25 DIAGNOSIS — Z0000 Encounter for general adult medical examination without abnormal findings: Secondary | ICD-10-CM

## 2012-01-25 DIAGNOSIS — H6692 Otitis media, unspecified, left ear: Secondary | ICD-10-CM

## 2012-01-25 DIAGNOSIS — E119 Type 2 diabetes mellitus without complications: Secondary | ICD-10-CM

## 2012-01-25 DIAGNOSIS — H6690 Otitis media, unspecified, unspecified ear: Secondary | ICD-10-CM

## 2012-01-25 DIAGNOSIS — E785 Hyperlipidemia, unspecified: Secondary | ICD-10-CM

## 2012-01-25 MED ORDER — PIOGLITAZONE HCL 45 MG PO TABS: 45.0000 mg | ORAL_TABLET | Freq: Every day | ORAL | Status: DC

## 2012-01-25 MED ORDER — LEVOFLOXACIN 250 MG PO TABS: 250.0000 mg | ORAL_TABLET | Freq: Every day | ORAL | Status: DC

## 2012-01-25 NOTE — Assessment & Plan Note (Signed)
stable overall by history and exam, recent data reviewed with pt, and pt to continue medical treatment as before,  to f/u any worsening symptoms or concerns BP Readings from Last 3 Encounters:  01/25/12 118/68  10/18/11 132/84  10/04/11 132/82

## 2012-01-25 NOTE — Progress Notes (Signed)
Subjective:    Patient ID: Traci [name], female    DOB: 10/22/1960, 52 y.o.   MRN: [id number]  HPI  Here to f/u; overall doing ok,  Pt denies chest pain, increased sob or doe, wheezing, orthopnea, PND, increased LE swelling, palpitations, dizziness or syncope.  Pt denies polydipsia, polyuria, or low sugar symptoms such as weakness or confusion improved with po intake.  Pt denies new neurological symptoms such as new headache, or facial or extremity weakness or numbness.   Pt states overall good compliance with meds, has been trying to follow lower cholesterol, diabetic diet,  but little exercise however.  Wt from 177 to 179 since mar 2013. No acute complaints except for left ear pain, andl low grade temp for several days. Denies worsening depressive symptoms, suicidal ideation, or panic Past Medical History  Diagnosis Date  . ALLERGIC RHINITIS 07/07/2008  . CONTACT DERMATITIS 11/30/2009  . DIABETES MELLITUS, TYPE II 11/29/2006  . Dizziness and giddiness 04/26/2007  . HYPERLIPIDEMIA 01/07/2007  . HYPERTENSION 07/08/2007  . PLANTAR FASCIITIS, LEFT 08/05/2007  . Preventative health care 07/30/2010   Past Surgical History  Procedure Date  . Tubal ligation     reports that she has never smoked. She does not have any smokeless tobacco history on file. She reports that she drinks alcohol. She reports that she does not use illicit drugs. family history includes Cancer in her father and mother; Diabetes in her father; and Stroke in her father and mother. Allergies  Allergen Reactions  . Penicillins     REACTION: Rash   Current Outpatient Prescriptions on File Prior to Visit  Medication Sig Dispense Refill  . aspirin 81 MG tablet Take 81 mg by mouth daily.        . fexofenadine (ALLEGRA) 180 MG tablet Take 180 mg by mouth daily.        . fluticasone (FLONASE) 50 MCG/ACT nasal spray Place 2 sprays into the nose daily.        Marland Kitchen glucose blood test strip Use as instructed  100 each  12  . Lancets  (ONETOUCH ULTRASOFT) lancets Use as directed two times a day       . lisinopril (PRINIVIL,ZESTRIL) 5 MG tablet TAKE ONE TABLET BY MOUTH EVERY DAY.  90 tablet  2  . metFORMIN (GLUCOPHAGE) 1000 MG tablet TAKE ONE TABLET BY MOUTH TWICE DAILY  60 tablet  4  . pravastatin (PRAVACHOL) 40 MG tablet TAKE ONE TABLET BY MOUTH EVERY DAY.  90 tablet  2  . meclizine (ANTIVERT) 12.5 MG tablet Take 1 tablet (12.5 mg total) by mouth 3 (three) times daily as needed.  40 tablet  1    Review of Systems  Constitutional: Negative for unexpected weight change, or unusual diaphoresis  HENT: Negative for tinnitus.   Eyes: Negative for photophobia and visual disturbance.  Respiratory: Negative for choking and stridor.   Gastrointestinal: Negative for vomiting and blood in stool.  Genitourinary: Negative for hematuria and decreased urine volume.  Musculoskeletal: Negative for acute joint swelling Skin: Negative for color change and wound.  Neurological: Negative for tremors and numbness other than noted  Psychiatric/Behavioral: Negative for decreased concentration or  hyperactivity.       Objective:   Physical Exam BP 118/68  Pulse 95  Temp 98 F (36.7 C) (Oral)  Wt 179 lb (81.194 kg)  SpO2 97% VS noted,  Constitutional: Pt appears well-developed and well-nourished.  HENT: Head: NCAT.  Right Ear: External ear normal.  Left Ear:  External ear normal. left TM severe erythema, mild bulging Eyes: Conjunctivae and EOM are normal. Pupils are equal, round, and reactive to light.  Neck: Normal range of motion. Neck supple.  Cardiovascular: Normal rate and regular rhythm.   Pulmonary/Chest: Effort normal and breath sounds normal.  Abd:  Soft, NT, non-distended, + BS Neurological: Pt is alert. Not confused  Skin: Skin is warm. No erythema.  Psychiatric: Pt behavior is normal. Thought content normal.     Assessment & Plan:

## 2012-01-25 NOTE — Assessment & Plan Note (Signed)
Mild to mod, for antibx course,  to f/u any worsening symptoms or concerns 

## 2012-01-25 NOTE — Assessment & Plan Note (Signed)
stable overall by history and exam, recent data reviewed with pt, and pt to continue medical treatment as before except to incr the actos to 45 mg,  to f/u any worsening symptoms or concerns Lab Results  Component Value Date   HGBA1C 8.4* 01/18/2012

## 2012-01-25 NOTE — Assessment & Plan Note (Signed)
stable overall by history and exam, recent data reviewed with pt, and pt to continue medical treatment as before,  to f/u any worsening symptoms or concerns Lab Results  Component Value Date   LDLCALC 87 01/18/2012

## 2012-01-25 NOTE — Patient Instructions (Addendum)
Please take all new medication as prescribed - the antibiotic Ok to increase the actos to 45 mg Please continue all other medications as before, and refills have been done if requested. Please continue your efforts at being more active, low cholesterol diet, and weight control. Thank you for enrolling in MyChart. Please follow the instructions below to securely access your online medical record. MyChart allows you to send messages to your doctor, view your test results, renew your prescriptions, schedule appointments, and more. To Log into My Chart online, please go by [employer] or [employer] to [email or web address], or download the MyChart App from the [employer] of [employer] .  Your Username is:  Tarika (pass [phone]) Please send a practice Message on Mychart later today. Please return in 6 months, or sooner if needed, with Lab testing done 3-5 days before

## 2012-02-15 ENCOUNTER — Other Ambulatory Visit: Payer: Self-pay | Admitting: Internal Medicine

## 2012-02-23 ENCOUNTER — Other Ambulatory Visit: Payer: Self-pay

## 2012-04-07 ENCOUNTER — Other Ambulatory Visit: Payer: Self-pay | Admitting: Internal Medicine

## 2012-06-19 ENCOUNTER — Other Ambulatory Visit: Payer: Self-pay | Admitting: Internal Medicine

## 2012-06-19 ENCOUNTER — Encounter: Payer: Self-pay | Admitting: Internal Medicine

## 2012-06-19 ENCOUNTER — Other Ambulatory Visit (INDEPENDENT_AMBULATORY_CARE_PROVIDER_SITE_OTHER): Payer: BC Managed Care – PPO

## 2012-06-19 ENCOUNTER — Ambulatory Visit (INDEPENDENT_AMBULATORY_CARE_PROVIDER_SITE_OTHER): Payer: BC Managed Care – PPO | Admitting: Internal Medicine

## 2012-06-19 VITALS — BP 130/80 | HR 106 | Temp 101.4°F | Ht 62.0 in | Wt 182.0 lb

## 2012-06-19 DIAGNOSIS — E119 Type 2 diabetes mellitus without complications: Secondary | ICD-10-CM

## 2012-06-19 DIAGNOSIS — J019 Acute sinusitis, unspecified: Secondary | ICD-10-CM

## 2012-06-19 DIAGNOSIS — I10 Essential (primary) hypertension: Secondary | ICD-10-CM

## 2012-06-19 DIAGNOSIS — Z0000 Encounter for general adult medical examination without abnormal findings: Secondary | ICD-10-CM

## 2012-06-19 DIAGNOSIS — E785 Hyperlipidemia, unspecified: Secondary | ICD-10-CM

## 2012-06-19 LAB — URINALYSIS, ROUTINE W REFLEX MICROSCOPIC
Bilirubin Urine: NEGATIVE
Hgb urine dipstick: NEGATIVE
Ketones, ur: NEGATIVE
Total Protein, Urine: NEGATIVE
Urine Glucose: >=1000

## 2012-06-19 LAB — MICROALBUMIN / CREATININE URINE RATIO: Creatinine,U: 66.8 mg/dL

## 2012-06-19 LAB — CBC WITH DIFFERENTIAL/PLATELET
Basophils Relative: 0.2 % (ref 0.0–3.0)
Eosinophils Absolute: 0.1 10*3/uL (ref 0.0–0.7)
Hemoglobin: 13.9 g/dL (ref 12.0–15.0)
Lymphs Abs: 2.1 10*3/uL (ref 0.7–4.0)
MCHC: 33.1 g/dL (ref 30.0–36.0)
MCV: 88.9 fl (ref 78.0–100.0)
Monocytes Absolute: 0.5 10*3/uL (ref 0.1–1.0)
Neutro Abs: 8.8 10*3/uL — ABNORMAL HIGH (ref 1.4–7.7)
RBC: 4.73 Mil/uL (ref 3.87–5.11)

## 2012-06-19 LAB — HEPATIC FUNCTION PANEL
Alkaline Phosphatase: 68 U/L (ref 39–117)
Bilirubin, Direct: 0.1 mg/dL (ref 0.0–0.3)
Total Bilirubin: 0.5 mg/dL (ref 0.3–1.2)

## 2012-06-19 LAB — BASIC METABOLIC PANEL
Calcium: 9.6 mg/dL (ref 8.4–10.5)
Chloride: 103 mEq/L (ref 96–112)
Creatinine, Ser: 0.6 mg/dL (ref 0.4–1.2)
Sodium: 137 mEq/L (ref 135–145)

## 2012-06-19 LAB — LIPID PANEL
HDL: 45.3 mg/dL (ref >39.00)
LDL Cholesterol: 92 mg/dL (ref 0–99)
Total CHOL/HDL Ratio: 4
Triglycerides: 135 mg/dL (ref 0.0–149.0)

## 2012-06-19 MED ORDER — AZITHROMYCIN 250 MG PO TABS: ORAL_TABLET | ORAL | Status: DC

## 2012-06-19 MED ORDER — GLIPIZIDE ER 10 MG PO TB24: 10.0000 mg | ORAL_TABLET | Freq: Every day | ORAL | Status: DC

## 2012-06-19 NOTE — Assessment & Plan Note (Addendum)
stable overall by history and exam, recent data reviewed with pt, and pt to continue medical treatment as before,  to f/u any worsening symptoms or concerns  

## 2012-06-19 NOTE — Progress Notes (Signed)
Subjective:    Patient ID: Traci [name], female    DOB: 16-Aug-1960, 52 y.o.   MRN: [id number]  HPI  Here for wellness and f/u;  Overall doing ok;  Pt denies CP, worsening SOB, DOE, wheezing, orthopnea, PND, worsening LE edema, palpitations, dizziness or syncope.  Pt denies neurological change such as new headache, facial or extremity weakness.  Pt denies polydipsia, polyuria, or low sugar symptoms. Pt states overall good compliance with treatment and medications, good tolerability, and has been trying to follow lower cholesterol diet.  Pt denies worsening depressive symptoms, suicidal ideation or panic. No fever, night sweats, wt loss, loss of appetite, or other constitutional symptoms.  Pt states good ability with ADL's, has low fall risk, home safety reviewed and adequate, no other significant changes in hearing or vision, and only occasionally active with exercise.   Here with 2-3 days acute onset fever, left ear pain, facial pain, pressure, headache, general weakness and malaise, and greenish d/c.   Past Medical History  Diagnosis Date  . ALLERGIC RHINITIS 07/07/2008  . CONTACT DERMATITIS 11/30/2009  . DIABETES MELLITUS, TYPE II 11/29/2006  . Dizziness and giddiness 04/26/2007  . HYPERLIPIDEMIA 01/07/2007  . HYPERTENSION 07/08/2007  . PLANTAR FASCIITIS, LEFT 08/05/2007  . Preventative health care 07/30/2010   Past Surgical History  Procedure Laterality Date  . Tubal ligation      reports that she has never smoked. She does not have any smokeless tobacco history on file. She reports that  drinks alcohol. She reports that she does not use illicit drugs. family history includes Cancer in her father and mother; Diabetes in her father; and Stroke in her father and mother. Allergies  Allergen Reactions  . Penicillins     REACTION: Rash   Current Outpatient Prescriptions on File Prior to Visit  Medication Sig Dispense Refill  . aspirin 81 MG tablet Take 81 mg by mouth daily.        .  fexofenadine (ALLEGRA) 180 MG tablet Take 180 mg by mouth daily.        . fluticasone (FLONASE) 50 MCG/ACT nasal spray Place 2 sprays into the nose daily.        Marland Kitchen glucose blood test strip Use as instructed  100 each  12  . Lancets (ONETOUCH ULTRASOFT) lancets Use as directed two times a day       . lisinopril (PRINIVIL,ZESTRIL) 5 MG tablet TAKE ONE TABLET BY MOUTH EVERY DAY  90 tablet  2  . meclizine (ANTIVERT) 12.5 MG tablet Take 1 tablet (12.5 mg total) by mouth 3 (three) times daily as needed.  40 tablet  1  . metFORMIN (GLUCOPHAGE) 1000 MG tablet TAKE ONE TABLET BY MOUTH TWICE DAILY  60 tablet  5  . pioglitazone (ACTOS) 45 MG tablet Take 1 tablet (45 mg total) by mouth daily.  90 tablet  3  . pravastatin (PRAVACHOL) 40 MG tablet TAKE ONE TABLET BY MOUTH EVERY DAY  90 tablet  2   No current facility-administered medications on file prior to visit.   Review of Systems Constitutional: Negative for diaphoresis, activity change, appetite change or unexpected weight change.  HENT: Negative for hearing loss, ear pain, facial swelling, mouth sores and neck stiffness.   Eyes: Negative for pain, redness and visual disturbance.  Respiratory: Negative for shortness of breath and wheezing.   Cardiovascular: Negative for chest pain and palpitations.  Gastrointestinal: Negative for diarrhea, blood in stool, abdominal distention or other pain Genitourinary: Negative for hematuria, flank  pain or change in urine volume.  Musculoskeletal: Negative for myalgias and joint swelling.  Skin: Negative for color change and wound.  Neurological: Negative for syncope and numbness. other than noted Hematological: Negative for adenopathy.  Psychiatric/Behavioral: Negative for hallucinations, self-injury, decreased concentration and agitation.      Objective:   Physical Exam BP 130/80  Pulse 106  Temp(Src) 101.4 F (38.6 C) (Oral)  Ht 5\' 2"  (1.575 m)  Wt 182 lb (82.555 kg)  BMI 33.28 kg/m2  SpO2 97% VS  noted,  Constitutional: Pt is oriented to person, place, and time. Appears well-developed and well-nourished. /morbid obese Head: Normocephalic and atraumatic.  Right Ear: External ear normal.  Left Ear: External ear normal.  Nose: Nose normal.  Mouth/Throat: Oropharynx is clear and moist.  Bilat tm's with mild erythema.  Max sinus areas mild tender.  Pharynx with mild erythema, no exudate Eyes: Conjunctivae and EOM are normal. Pupils are equal, round, and reactive to light.  Neck: Normal range of motion. Neck supple. No JVD present. No tracheal deviation present.  Cardiovascular: Normal rate, regular rhythm, normal heart sounds and intact distal pulses.   Pulmonary/Chest: Effort normal and breath sounds normal.  Abdominal: Soft. Bowel sounds are normal. There is no tenderness. No HSM  Musculoskeletal: Normal range of motion. Exhibits no edema.  Lymphadenopathy:  Has no cervical adenopathy.  Neurological: Pt is alert and oriented to person, place, and time. Pt has normal reflexes. No cranial nerve deficit.  Skin: Skin is warm and dry. No rash noted.  Psychiatric:  Has  normal mood and affect. Behavior is normal.     Assessment & Plan:

## 2012-06-19 NOTE — Assessment & Plan Note (Signed)

## 2012-06-19 NOTE — Assessment & Plan Note (Signed)
Mild to mod, for antibx course,  to f/u any worsening symptoms or concerns 

## 2012-06-19 NOTE — Assessment & Plan Note (Signed)
stable overall by history and exam, recent data reviewed with pt, and pt to continue medical treatment as before,  to f/u any worsening symptoms or concerns Lab Results  Component Value Date   LDLCALC 92 06/19/2012

## 2012-06-19 NOTE — Assessment & Plan Note (Signed)
stable overall by history and exam, recent data reviewed with pt, and pt to continue medical treatment as before,  to f/u any worsening symptoms or concerns BP Readings from Last 3 Encounters:  06/19/12 130/80  01/25/12 118/68  10/18/11 132/84

## 2012-06-19 NOTE — Patient Instructions (Addendum)
Please take all new medication as prescribed - the antbiotic You can also take Delsym OTC for cough, and/or Mucinex (or it's generic off brand) for congestion, and tylenol as needed for pain. Please continue all other medications as before, and refills have been done if requested. Please have the pharmacy call with any other refills you may need. Please continue your efforts at being more active, low cholesterol diet, and weight control. You will be contacted regarding the referral for: colonoscopy (GI in [place]) Please go to the LAB in the Basement (turn left off the elevator) for the tests to be done today You will be contacted by phone if any changes need to be made immediately.  Otherwise, you will receive a letter about your results with an explanation, but please check with MyChart first.  Thank you for enrolling in MyChart. Please follow the instructions below to securely access your online medical record. MyChart allows you to send messages to your doctor, view your test results, renew your prescriptions, schedule appointments, and more.  Please return in 6 months, or sooner if needed, with Lab testing done 3-5 days before  OK to cancel appt July 17

## 2012-06-24 ENCOUNTER — Telehealth: Payer: Self-pay

## 2012-06-24 MED ORDER — LEVOFLOXACIN 250 MG PO TABS: 250.0000 mg | ORAL_TABLET | Freq: Every day | ORAL | Status: DC

## 2012-06-24 NOTE — Telephone Encounter (Signed)
Patient called [facility] stating that she was seen 06/19/12 for URI and inner ear infection. Pt has completed antibiotic but states ear is worse. Pt would like for MD to send a RX or advise on something else. Thanks

## 2012-06-24 NOTE — Telephone Encounter (Signed)
Patient informed. 

## 2012-06-24 NOTE — Telephone Encounter (Signed)
Ok to change to levaquin - done erx

## 2012-06-28 ENCOUNTER — Other Ambulatory Visit: Payer: Self-pay | Admitting: Internal Medicine

## 2012-07-24 ENCOUNTER — Encounter: Payer: BC Managed Care – PPO | Admitting: Internal Medicine

## 2012-08-21 ENCOUNTER — Other Ambulatory Visit: Payer: Self-pay | Admitting: Internal Medicine

## 2012-11-13 ENCOUNTER — Other Ambulatory Visit: Payer: Self-pay

## 2012-11-17 LAB — HM MAMMOGRAPHY

## 2012-11-19 ENCOUNTER — Encounter: Payer: Self-pay | Admitting: Gynecology

## 2012-12-18 ENCOUNTER — Other Ambulatory Visit (INDEPENDENT_AMBULATORY_CARE_PROVIDER_SITE_OTHER): Payer: BC Managed Care – PPO

## 2012-12-18 DIAGNOSIS — E119 Type 2 diabetes mellitus without complications: Secondary | ICD-10-CM

## 2012-12-18 LAB — HEPATIC FUNCTION PANEL
ALT: 58 U/L — ABNORMAL HIGH (ref 0–35)
Albumin: 4.1 g/dL (ref 3.5–5.2)
Total Protein: 7.8 g/dL (ref 6.0–8.3)

## 2012-12-18 LAB — BASIC METABOLIC PANEL
BUN: 12 mg/dL (ref 6–23)
CO2: 26 mEq/L (ref 19–32)
Chloride: 105 mEq/L (ref 96–112)
Creatinine, Ser: 0.8 mg/dL (ref 0.4–1.2)

## 2012-12-18 LAB — LIPID PANEL
Cholesterol: 157 mg/dL (ref 0–200)
LDL Cholesterol: 87 mg/dL (ref 0–99)
Triglycerides: 135 mg/dL (ref 0.0–149.0)

## 2012-12-19 ENCOUNTER — Encounter: Payer: Self-pay | Admitting: Internal Medicine

## 2012-12-19 ENCOUNTER — Ambulatory Visit (INDEPENDENT_AMBULATORY_CARE_PROVIDER_SITE_OTHER): Payer: BC Managed Care – PPO | Admitting: Internal Medicine

## 2012-12-19 VITALS — BP 120/78 | HR 104 | Temp 97.9°F | Ht 62.0 in | Wt 190.4 lb

## 2012-12-19 DIAGNOSIS — Z0000 Encounter for general adult medical examination without abnormal findings: Secondary | ICD-10-CM

## 2012-12-19 DIAGNOSIS — Z23 Encounter for immunization: Secondary | ICD-10-CM

## 2012-12-19 DIAGNOSIS — IMO0001 Reserved for inherently not codable concepts without codable children: Secondary | ICD-10-CM

## 2012-12-19 DIAGNOSIS — I10 Essential (primary) hypertension: Secondary | ICD-10-CM

## 2012-12-19 DIAGNOSIS — E119 Type 2 diabetes mellitus without complications: Secondary | ICD-10-CM

## 2012-12-19 DIAGNOSIS — E785 Hyperlipidemia, unspecified: Secondary | ICD-10-CM

## 2012-12-19 NOTE — Patient Instructions (Addendum)
You had the flu shot today Please continue all other medications as before, and refills have been done if requested. Please have the pharmacy call with any other refills you may need.  Please continue your efforts at being more active, low cholesterol diet, and weight control.  Please remember to sign up for My Chart if you have not done so, as this will be important to you in the future with finding out test results, communicating by private email, and scheduling acute appointments online when needed.  Please return in 6 months, or sooner if needed, with Lab testing done 3-5 days before  

## 2012-12-19 NOTE — Assessment & Plan Note (Signed)
stable overall by history and exam, recent data reviewed with pt, and pt to continue medical treatment as before,  to f/u any worsening symptoms or concerns Lab Results  Component Value Date   LDLCALC 87 12/18/2012

## 2012-12-19 NOTE — Progress Notes (Signed)
Pre-visit discussion using our clinic review tool. No additional management support is needed unless otherwise documented below in the visit note.  

## 2012-12-19 NOTE — Progress Notes (Signed)
Subjective:    Patient ID: Traci [name], female    DOB: 11-03-1960, 52 y.o.   MRN: [id number]  HPI  Here to f/u; overall doing ok,  Pt denies chest pain, increased sob or doe, wheezing, orthopnea, PND, increased LE swelling, palpitations, dizziness or syncope.  Pt denies polydipsia, polyuria, or low sugar symptoms such as weakness or confusion improved with po intake.  Pt denies new neurological symptoms such as new headache, or facial or extremity weakness or numbness.   Pt states overall good compliance with meds, has been trying to follow lower cholesterol, diabetic diet, with wt overall stable,  but little exercise however. No acute complaints. Past Medical History  Diagnosis Date  . ALLERGIC RHINITIS 07/07/2008  . CONTACT DERMATITIS 11/30/2009  . DIABETES MELLITUS, TYPE II 11/29/2006  . Dizziness and giddiness 04/26/2007  . HYPERLIPIDEMIA 01/07/2007  . HYPERTENSION 07/08/2007  . PLANTAR FASCIITIS, LEFT 08/05/2007  . Preventative health care 07/30/2010   Past Surgical History  Procedure Laterality Date  . Tubal ligation      reports that she has never smoked. She does not have any smokeless tobacco history on file. She reports that she drinks alcohol. She reports that she does not use illicit drugs. family history includes Cancer in her father and mother; Diabetes in her father; Stroke in her father and mother. Allergies  Allergen Reactions  . Penicillins     REACTION: Rash   Current Outpatient Prescriptions on File Prior to Visit  Medication Sig Dispense Refill  . aspirin 81 MG tablet Take 81 mg by mouth daily.        . fexofenadine (ALLEGRA) 180 MG tablet Take 180 mg by mouth daily.        . fluticasone (FLONASE) 50 MCG/ACT nasal spray Place 2 sprays into the nose daily.        Marland Kitchen glipiZIDE (GLIPIZIDE XL) 10 MG 24 hr tablet Take 1 tablet (10 mg total) by mouth daily.  90 tablet  3  . glucose blood test strip Use as instructed  100 each  12  . Lancets (ONETOUCH ULTRASOFT) lancets  Use as directed two times a day       . lisinopril (PRINIVIL,ZESTRIL) 5 MG tablet TAKE ONE TABLET BY MOUTH EVERY DAY  90 tablet  2  . meclizine (ANTIVERT) 12.5 MG tablet Take 1 tablet (12.5 mg total) by mouth 3 (three) times daily as needed.  40 tablet  1  . metFORMIN (GLUCOPHAGE) 1000 MG tablet TAKE ONE TABLET BY MOUTH TWICE DAILY  60 tablet  11  . pioglitazone (ACTOS) 45 MG tablet Take 1 tablet (45 mg total) by mouth daily.  90 tablet  3  . pravastatin (PRAVACHOL) 40 MG tablet TAKE ONE TABLET BY MOUTH EVERY DAY  90 tablet  2   No current facility-administered medications on file prior to visit.   Review of Systems  Constitutional: Negative for unexpected weight change, or unusual diaphoresis  HENT: Negative for tinnitus.   Eyes: Negative for photophobia and visual disturbance.  Respiratory: Negative for choking and stridor.   Gastrointestinal: Negative for vomiting and blood in stool.  Genitourinary: Negative for hematuria and decreased urine volume.  Musculoskeletal: Negative for acute joint swelling Skin: Negative for color change and wound.  Neurological: Negative for tremors and numbness other than noted  Psychiatric/Behavioral: Negative for decreased concentration or  hyperactivity.       Objective:   Physical Exam BP 120/78  Pulse 104  Temp(Src) 97.9 F (36.6 C) (  Oral)  Ht 5\' 2"  (1.575 m)  Wt 190 lb 6 oz (86.354 kg)  BMI 34.81 kg/m2  SpO2 97%. VS noted,  Constitutional: Pt appears well-developed and well-nourished.  HENT: Head: NCAT.  Right Ear: External ear normal.  Left Ear: External ear normal.  Eyes: Conjunctivae and EOM are normal. Pupils are equal, round, and reactive to light.  Neck: Normal range of motion. Neck supple.  Cardiovascular: Normal rate and regular rhythm.   Pulmonary/Chest: Effort normal and breath sounds normal.  Neurological: Pt is alert. Not confused  Skin: Skin is warm. No erythema.  Psychiatric: Pt behavior is normal. Thought content normal.      Assessment & Plan:

## 2012-12-19 NOTE — Assessment & Plan Note (Signed)
stable overall by history and exam, recent data reviewed with pt, and pt to continue medical treatment as before,  to f/u any worsening symptoms or concerns BP Readings from Last 3 Encounters:  12/19/12 120/78  06/19/12 130/80  01/25/12 118/68

## 2012-12-19 NOTE — Assessment & Plan Note (Addendum)
stable overall by history and exam, recent data reviewed with pt, and pt to continue medical treatment as before,  to f/u any worsening symptoms or concerns Lab Results  Component Value Date   HGBA1C 7.7* 12/18/2012   To work more on diet , goal a1c < 7

## 2012-12-29 ENCOUNTER — Other Ambulatory Visit: Payer: Self-pay | Admitting: Internal Medicine

## 2013-04-01 ENCOUNTER — Telehealth: Payer: Self-pay | Admitting: Internal Medicine

## 2013-04-01 DIAGNOSIS — Z0000 Encounter for general adult medical examination without abnormal findings: Secondary | ICD-10-CM

## 2013-04-01 NOTE — Telephone Encounter (Signed)
Pt called request referral for colonoscopy due to her age. Pt request the precedure code so she can send to the insurance company to get them to pay 100 %. Please advise.

## 2013-04-02 NOTE — Telephone Encounter (Signed)
A referral to GI for direct scheduling of colonosocpy for routine screening colonoscopy was done  Note to pt, however, I cannot guarantee how this is coded in the in end, as this is done by the GI physician

## 2013-04-03 NOTE — Telephone Encounter (Signed)
Patient informed of referral and MD instructions

## 2013-05-04 ENCOUNTER — Encounter: Payer: Self-pay | Admitting: Internal Medicine

## 2013-06-12 ENCOUNTER — Other Ambulatory Visit (INDEPENDENT_AMBULATORY_CARE_PROVIDER_SITE_OTHER): Payer: BC Managed Care – PPO

## 2013-06-12 DIAGNOSIS — E1165 Type 2 diabetes mellitus with hyperglycemia: Principal | ICD-10-CM

## 2013-06-12 DIAGNOSIS — Z0000 Encounter for general adult medical examination without abnormal findings: Secondary | ICD-10-CM

## 2013-06-12 DIAGNOSIS — IMO0001 Reserved for inherently not codable concepts without codable children: Secondary | ICD-10-CM

## 2013-06-12 LAB — HEPATIC FUNCTION PANEL
ALBUMIN: 4.1 g/dL (ref 3.5–5.2)
ALT: 59 U/L — ABNORMAL HIGH (ref 0–35)
AST: 56 U/L — AB (ref 0–37)
Alkaline Phosphatase: 57 U/L (ref 39–117)
BILIRUBIN DIRECT: 0.1 mg/dL (ref 0.0–0.3)
TOTAL PROTEIN: 7.4 g/dL (ref 6.0–8.3)
Total Bilirubin: 0.4 mg/dL (ref 0.2–1.2)

## 2013-06-12 LAB — CBC WITH DIFFERENTIAL/PLATELET
BASOS ABS: 0 10*3/uL (ref 0.0–0.1)
Basophils Relative: 0.3 % (ref 0.0–3.0)
Eosinophils Absolute: 0.2 10*3/uL (ref 0.0–0.7)
Eosinophils Relative: 2.5 % (ref 0.0–5.0)
HEMATOCRIT: 42.3 % (ref 36.0–46.0)
HEMOGLOBIN: 14.1 g/dL (ref 12.0–15.0)
Lymphocytes Relative: 29.7 % (ref 12.0–46.0)
Lymphs Abs: 2.2 10*3/uL (ref 0.7–4.0)
MCHC: 33.3 g/dL (ref 30.0–36.0)
MCV: 87.4 fl (ref 78.0–100.0)
Monocytes Absolute: 0.4 10*3/uL (ref 0.1–1.0)
Monocytes Relative: 5.5 % (ref 3.0–12.0)
Neutro Abs: 4.7 10*3/uL (ref 1.4–7.7)
Neutrophils Relative %: 62 % (ref 43.0–77.0)
PLATELETS: 280 10*3/uL (ref 150.0–400.0)
RBC: 4.84 Mil/uL (ref 3.87–5.11)
RDW: 14 % (ref 11.5–15.5)
WBC: 7.5 10*3/uL (ref 4.0–10.5)

## 2013-06-12 LAB — URINALYSIS, ROUTINE W REFLEX MICROSCOPIC
BILIRUBIN URINE: NEGATIVE
Hgb urine dipstick: NEGATIVE
KETONES UR: NEGATIVE
LEUKOCYTES UA: NEGATIVE
Nitrite: NEGATIVE
RBC / HPF: NONE SEEN (ref 0–?)
Specific Gravity, Urine: >=1.03 — AB (ref 1.000–1.030)
Total Protein, Urine: NEGATIVE
URINE GLUCOSE: NEGATIVE
UROBILINOGEN UA: 0.2 (ref 0.0–1.0)
pH: 5.5 (ref 5.0–8.0)

## 2013-06-12 LAB — BASIC METABOLIC PANEL
BUN: 12 mg/dL (ref 6–23)
CALCIUM: 9.4 mg/dL (ref 8.4–10.5)
CO2: 26 meq/L (ref 19–32)
Chloride: 105 mEq/L (ref 96–112)
Creatinine, Ser: 0.7 mg/dL (ref 0.4–1.2)
GFR: 98.18 mL/min (ref >60.00)
Glucose, Bld: 163 mg/dL — ABNORMAL HIGH (ref 70–99)
Potassium: 4.3 mEq/L (ref 3.5–5.1)
SODIUM: 139 meq/L (ref 135–145)

## 2013-06-12 LAB — MICROALBUMIN / CREATININE URINE RATIO
CREATININE, U: 127.6 mg/dL
Microalb Creat Ratio: 0.6 mg/g (ref 0.0–30.0)
Microalb, Ur: 0.8 mg/dL (ref 0.0–1.9)

## 2013-06-12 LAB — LIPID PANEL
Cholesterol: 157 mg/dL (ref 0–200)
HDL: 40.9 mg/dL (ref >39.00)
LDL Cholesterol: 88 mg/dL (ref 0–99)
NONHDL: 116.1
Total CHOL/HDL Ratio: 4
Triglycerides: 143 mg/dL (ref 0.0–149.0)
VLDL: 28.6 mg/dL (ref 0.0–40.0)

## 2013-06-12 LAB — TSH: TSH: 2.37 u[IU]/mL (ref 0.35–4.50)

## 2013-06-12 LAB — HEMOGLOBIN A1C: HEMOGLOBIN A1C: 7.4 % — AB (ref 4.6–6.5)

## 2013-06-19 ENCOUNTER — Encounter: Payer: Self-pay | Admitting: Internal Medicine

## 2013-06-19 ENCOUNTER — Ambulatory Visit (INDEPENDENT_AMBULATORY_CARE_PROVIDER_SITE_OTHER): Payer: BC Managed Care – PPO | Admitting: Internal Medicine

## 2013-06-19 VITALS — BP 110/70 | HR 82 | Temp 98.5°F | Ht 62.0 in | Wt 192.0 lb

## 2013-06-19 DIAGNOSIS — Z0000 Encounter for general adult medical examination without abnormal findings: Secondary | ICD-10-CM

## 2013-06-19 DIAGNOSIS — E119 Type 2 diabetes mellitus without complications: Secondary | ICD-10-CM

## 2013-06-19 MED ORDER — GLUCOSE BLOOD VI STRP: ORAL_STRIP | Status: DC

## 2013-06-19 MED ORDER — METFORMIN HCL 1000 MG PO TABS: 1000.0000 mg | ORAL_TABLET | Freq: Two times a day (BID) | ORAL | Status: DC

## 2013-06-19 MED ORDER — LISINOPRIL 5 MG PO TABS: 5.0000 mg | ORAL_TABLET | Freq: Every day | ORAL | Status: DC

## 2013-06-19 MED ORDER — GLUCOSE BLOOD VI STRP: ORAL_STRIP | Status: AC

## 2013-06-19 MED ORDER — LANCETS MISC: Status: AC

## 2013-06-19 MED ORDER — GLIPIZIDE ER 10 MG PO TB24: 10.0000 mg | ORAL_TABLET | Freq: Every day | ORAL | Status: DC

## 2013-06-19 MED ORDER — PRAVASTATIN SODIUM 40 MG PO TABS: 40.0000 mg | ORAL_TABLET | Freq: Every day | ORAL | Status: DC

## 2013-06-19 MED ORDER — LANCETS MISC: 1.0000 "application " | Freq: Every day | Status: DC

## 2013-06-19 MED ORDER — PIOGLITAZONE HCL 30 MG PO TABS: 30.0000 mg | ORAL_TABLET | Freq: Every day | ORAL | Status: DC

## 2013-06-19 NOTE — Progress Notes (Signed)
Pre visit review using our clinic review tool, if applicable. No additional management support is needed unless otherwise documented below in the visit note. 

## 2013-06-19 NOTE — Assessment & Plan Note (Signed)
stable overall by history and exam, recent data reviewed with pt, and pt to continue medical treatment as before,  to f/u any worsening symptoms or concerns Lab Results  Component Value Date   HGBA1C 7.4* 06/12/2013

## 2013-06-19 NOTE — Assessment & Plan Note (Signed)

## 2013-06-19 NOTE — Addendum Note (Signed)
Addended by: Sharon Seller B on: 06/19/2013 03:20 PM   Modules accepted: Orders

## 2013-06-19 NOTE — Progress Notes (Signed)
Subjective:    Patient ID: Traci [name], female    DOB: April 02, 1960, 53 y.o.   MRN: [id number]  HPI  Here for wellness and f/u;  Overall doing ok;  Pt denies CP, worsening SOB, DOE, wheezing, orthopnea, PND, worsening LE edema, palpitations, dizziness or syncope.  Pt denies neurological change such as new headache, facial or extremity weakness.  Pt denies polydipsia, polyuria, or low sugar symptoms. Pt states overall good compliance with treatment and medications, good tolerability, and has been trying to follow lower cholesterol diet.  Pt denies worsening depressive symptoms, suicidal ideation or panic. No fever, night sweats, wt loss, loss of appetite, or other constitutional symptoms.  Pt states good ability with ADL's, has low fall risk, home safety reviewed and adequate, no other significant changes in hearing or vision, and only occasionally active with exercise.   Pt has bottle, only taking actos 30 mg (did not change to the 45 mg) apparently. No current complaints Past Medical History  Diagnosis Date  . ALLERGIC RHINITIS 07/07/2008  . CONTACT DERMATITIS 11/30/2009  . DIABETES MELLITUS, TYPE II 11/29/2006  . Dizziness and giddiness 04/26/2007  . HYPERLIPIDEMIA 01/07/2007  . HYPERTENSION 07/08/2007  . PLANTAR FASCIITIS, LEFT 08/05/2007  . Preventative health care 07/30/2010   Past Surgical History  Procedure Laterality Date  . Tubal ligation      reports that she has never smoked. She does not have any smokeless tobacco history on file. She reports that she drinks alcohol. She reports that she does not use illicit drugs. family history includes Cancer in her father and mother; Diabetes in her father; Stroke in her father and mother. Allergies  Allergen Reactions  . Penicillins     REACTION: Rash   Current Outpatient Prescriptions on File Prior to Visit  Medication Sig Dispense Refill  . aspirin 81 MG tablet Take 81 mg by mouth daily.        . fexofenadine (ALLEGRA) 180 MG tablet  Take 180 mg by mouth daily.        . fluticasone (FLONASE) 50 MCG/ACT nasal spray Place 2 sprays into the nose daily.        . meclizine (ANTIVERT) 12.5 MG tablet Take 1 tablet (12.5 mg total) by mouth 3 (three) times daily as needed.  40 tablet  1   No current facility-administered medications on file prior to visit.    Review of Systems Constitutional: Negative for increased diaphoresis, other activity, appetite or other siginficant weight change  HENT: Negative for worsening hearing loss, ear pain, facial swelling, mouth sores and neck stiffness.   Eyes: Negative for other worsening pain, redness or visual disturbance.  Respiratory: Negative for shortness of breath and wheezing.   Cardiovascular: Negative for chest pain and palpitations.  Gastrointestinal: Negative for diarrhea, blood in stool, abdominal distention or other pain Genitourinary: Negative for hematuria, flank pain or change in urine volume.  Musculoskeletal: Negative for myalgias or other joint complaints.  Skin: Negative for color change and wound.  Neurological: Negative for syncope and numbness. other than noted Hematological: Negative for adenopathy. or other swelling Psychiatric/Behavioral: Negative for hallucinations, self-injury, decreased concentration or other worsening agitation.      Objective:   Physical Exam BP 110/70  Pulse 82  Temp(Src) 98.5 F (36.9 C) (Oral)  Ht 5\' 2"  (1.575 m)  Wt 192 lb (87.091 kg)  BMI 35.11 kg/m2  SpO2 99% VS noted,  Constitutional: Pt is oriented to person, place, and time. Appears well-developed and well-nourished.  Head: Normocephalic and atraumatic.  Right Ear: External ear normal.  Left Ear: External ear normal.  Nose: Nose normal.  Mouth/Throat: Oropharynx is clear and moist.  Eyes: Conjunctivae and EOM are normal. Pupils are equal, round, and reactive to light.  Neck: Normal [name] of motion. Neck supple. No JVD present. No tracheal deviation present.    Cardiovascular: Normal rate, regular rhythm, normal heart sounds and intact distal pulses.   Pulmonary/Chest: Effort normal and breath sounds without rales or wheezing  Abdominal: Soft. Bowel sounds are normal. NT. No HSM  Musculoskeletal: Normal [name] of motion. Exhibits no edema.  Lymphadenopathy:  Has no cervical adenopathy.  Neurological: Pt is alert and oriented to person, place, and time. Pt has normal reflexes. No cranial nerve deficit. Motor grossly intact Skin: Skin is warm and dry. No rash noted.  Psychiatric:  Has normal mood and affect. Behavior is normal.      Assessment & Plan:

## 2013-06-19 NOTE — Patient Instructions (Signed)
Please continue all other medications as before, and refills have been done if requested, including the actos 30 mg per day, and the supplies  Please have the pharmacy call with any other refills you may need.  Please continue your efforts at being more active, low cholesterol diet, and weight control.  You are otherwise up to date with prevention measures today.  Please keep your appointments with your specialists as you may have planned  Please remember to call about the colonoscopy as you have planned  Please return in 6 months, or sooner if needed, with Lab testing done 3-5 days before

## 2013-08-14 ENCOUNTER — Other Ambulatory Visit: Payer: Self-pay | Admitting: Internal Medicine

## 2013-12-15 ENCOUNTER — Other Ambulatory Visit (INDEPENDENT_AMBULATORY_CARE_PROVIDER_SITE_OTHER): Payer: BC Managed Care – PPO

## 2013-12-15 DIAGNOSIS — E119 Type 2 diabetes mellitus without complications: Secondary | ICD-10-CM

## 2013-12-15 LAB — LIPID PANEL
CHOLESTEROL: 140 mg/dL (ref 0–200)
HDL: 39.9 mg/dL (ref >39.00)
LDL Cholesterol: 77 mg/dL (ref 0–99)
NonHDL: 100.1
Total CHOL/HDL Ratio: 4
Triglycerides: 117 mg/dL (ref 0.0–149.0)
VLDL: 23.4 mg/dL (ref 0.0–40.0)

## 2013-12-15 LAB — BASIC METABOLIC PANEL
BUN: 11 mg/dL (ref 6–23)
CHLORIDE: 107 meq/L (ref 96–112)
CO2: 24 meq/L (ref 19–32)
Calcium: 8.7 mg/dL (ref 8.4–10.5)
Creatinine, Ser: 0.7 mg/dL (ref 0.4–1.2)
GFR: 101.47 mL/min (ref >60.00)
GLUCOSE: 141 mg/dL — AB (ref 70–99)
Potassium: 4.1 mEq/L (ref 3.5–5.1)
SODIUM: 139 meq/L (ref 135–145)

## 2013-12-15 LAB — HEMOGLOBIN A1C: HEMOGLOBIN A1C: 7.4 % — AB (ref 4.6–6.5)

## 2013-12-15 LAB — HEPATIC FUNCTION PANEL
ALBUMIN: 3.7 g/dL (ref 3.5–5.2)
ALT: 33 U/L (ref 0–35)
AST: 29 U/L (ref 0–37)
Alkaline Phosphatase: 51 U/L (ref 39–117)
Bilirubin, Direct: 0.1 mg/dL (ref 0.0–0.3)
Total Bilirubin: 0.4 mg/dL (ref 0.2–1.2)
Total Protein: 6.8 g/dL (ref 6.0–8.3)

## 2013-12-22 ENCOUNTER — Ambulatory Visit: Payer: BC Managed Care – PPO | Admitting: Internal Medicine

## 2014-01-08 HISTORY — PX: COLONOSCOPY: SHX174

## 2014-06-16 LAB — HM MAMMOGRAPHY: HM Mammogram: NEGATIVE

## 2014-06-17 ENCOUNTER — Encounter: Payer: Self-pay | Admitting: Internal Medicine

## 2014-06-24 LAB — HM DIABETES EYE EXAM

## 2014-06-28 ENCOUNTER — Encounter: Payer: Self-pay | Admitting: Internal Medicine

## 2014-07-03 ENCOUNTER — Other Ambulatory Visit: Payer: Self-pay | Admitting: Internal Medicine

## 2014-07-12 ENCOUNTER — Other Ambulatory Visit: Payer: Self-pay | Admitting: Internal Medicine

## 2014-07-19 ENCOUNTER — Encounter: Payer: Self-pay | Admitting: Internal Medicine

## 2014-08-05 ENCOUNTER — Telehealth: Payer: Self-pay | Admitting: Internal Medicine

## 2014-08-05 DIAGNOSIS — E118 Type 2 diabetes mellitus with unspecified complications: Secondary | ICD-10-CM

## 2014-08-05 DIAGNOSIS — Z0000 Encounter for general adult medical examination without abnormal findings: Secondary | ICD-10-CM

## 2014-08-05 NOTE — Telephone Encounter (Signed)
Patient has an appointment next week and she was wanting to do her labs this week. Please advise  Patient can be reached at [phone]

## 2014-08-05 NOTE — Telephone Encounter (Signed)
Labs ordered, pt advised via personal VM

## 2014-08-09 ENCOUNTER — Other Ambulatory Visit (INDEPENDENT_AMBULATORY_CARE_PROVIDER_SITE_OTHER): Payer: BLUE CROSS/BLUE SHIELD

## 2014-08-09 DIAGNOSIS — Z0000 Encounter for general adult medical examination without abnormal findings: Secondary | ICD-10-CM | POA: Diagnosis not present

## 2014-08-09 LAB — URINALYSIS, ROUTINE W REFLEX MICROSCOPIC
Bilirubin Urine: NEGATIVE
Hgb urine dipstick: NEGATIVE
KETONES UR: NEGATIVE
LEUKOCYTES UA: NEGATIVE
Nitrite: NEGATIVE
Specific Gravity, Urine: >=1.03 — AB (ref 1.000–1.030)
Total Protein, Urine: NEGATIVE
URINE GLUCOSE: NEGATIVE
Urobilinogen, UA: 0.2 (ref 0.0–1.0)
pH: 5.5 (ref 5.0–8.0)

## 2014-08-09 LAB — HEPATIC FUNCTION PANEL
ALBUMIN: 4 g/dL (ref 3.5–5.2)
ALT: 28 U/L (ref 0–35)
AST: 23 U/L (ref 0–37)
Alkaline Phosphatase: 51 U/L (ref 39–117)
BILIRUBIN TOTAL: 0.4 mg/dL (ref 0.2–1.2)
Bilirubin, Direct: 0.1 mg/dL (ref 0.0–0.3)
Total Protein: 6.9 g/dL (ref 6.0–8.3)

## 2014-08-09 LAB — BASIC METABOLIC PANEL
BUN: 12 mg/dL (ref 6–23)
CO2: 21 meq/L (ref 19–32)
Calcium: 9.3 mg/dL (ref 8.4–10.5)
Chloride: 107 mEq/L (ref 96–112)
Creatinine, Ser: 0.74 mg/dL (ref 0.40–1.20)
GFR: 87.15 mL/min (ref >60.00)
Glucose, Bld: 165 mg/dL — ABNORMAL HIGH (ref 70–99)
Potassium: 4.3 mEq/L (ref 3.5–5.1)
Sodium: 140 mEq/L (ref 135–145)

## 2014-08-09 LAB — LIPID PANEL
CHOL/HDL RATIO: 3
Cholesterol: 153 mg/dL (ref 0–200)
HDL: 44.5 mg/dL (ref >39.00)
LDL CALC: 79 mg/dL (ref 0–99)
NONHDL: 108.32
Triglycerides: 146 mg/dL (ref 0.0–149.0)
VLDL: 29.2 mg/dL (ref 0.0–40.0)

## 2014-08-09 LAB — CBC WITH DIFFERENTIAL/PLATELET
BASOS ABS: 0 10*3/uL (ref 0.0–0.1)
Basophils Relative: 0.5 % (ref 0.0–3.0)
EOS PCT: 2.8 % (ref 0.0–5.0)
Eosinophils Absolute: 0.2 10*3/uL (ref 0.0–0.7)
HCT: 39 % (ref 36.0–46.0)
Hemoglobin: 13 g/dL (ref 12.0–15.0)
LYMPHS PCT: 36.1 % (ref 12.0–46.0)
Lymphs Abs: 2.6 10*3/uL (ref 0.7–4.0)
MCHC: 33.3 g/dL (ref 30.0–36.0)
MCV: 87.1 fl (ref 78.0–100.0)
MONOS PCT: 4.9 % (ref 3.0–12.0)
Monocytes Absolute: 0.4 10*3/uL (ref 0.1–1.0)
Neutro Abs: 4 10*3/uL (ref 1.4–7.7)
Neutrophils Relative %: 55.7 % (ref 43.0–77.0)
PLATELETS: 243 10*3/uL (ref 150.0–400.0)
RBC: 4.48 Mil/uL (ref 3.87–5.11)
RDW: 14.1 % (ref 11.5–15.5)
WBC: 7.2 10*3/uL (ref 4.0–10.5)

## 2014-08-09 LAB — TSH: TSH: 1.6 u[IU]/mL (ref 0.35–4.50)

## 2014-08-12 ENCOUNTER — Other Ambulatory Visit (INDEPENDENT_AMBULATORY_CARE_PROVIDER_SITE_OTHER): Payer: BLUE CROSS/BLUE SHIELD

## 2014-08-12 ENCOUNTER — Ambulatory Visit (INDEPENDENT_AMBULATORY_CARE_PROVIDER_SITE_OTHER): Payer: BLUE CROSS/BLUE SHIELD | Admitting: Internal Medicine

## 2014-08-12 ENCOUNTER — Encounter: Payer: Self-pay | Admitting: Internal Medicine

## 2014-08-12 VITALS — BP 132/78 | HR 73 | Temp 98.1°F | Ht 62.0 in | Wt 195.0 lb

## 2014-08-12 DIAGNOSIS — E119 Type 2 diabetes mellitus without complications: Secondary | ICD-10-CM

## 2014-08-12 DIAGNOSIS — Z0000 Encounter for general adult medical examination without abnormal findings: Secondary | ICD-10-CM

## 2014-08-12 LAB — HEMOGLOBIN A1C: Hgb A1c MFr Bld: 6.9 % — ABNORMAL HIGH (ref 4.6–6.5)

## 2014-08-12 MED ORDER — MECLIZINE HCL 12.5 MG PO TABS: 12.5000 mg | ORAL_TABLET | Freq: Three times a day (TID) | ORAL | Status: DC | PRN

## 2014-08-12 MED ORDER — PIOGLITAZONE HCL 30 MG PO TABS: 30.0000 mg | ORAL_TABLET | Freq: Every day | ORAL | Status: DC

## 2014-08-12 MED ORDER — PIOGLITAZONE HCL 30 MG PO TABS
30.0000 mg | ORAL_TABLET | Freq: Every day | ORAL | Status: DC
Start: 2014-08-12 — End: 2014-11-19

## 2014-08-12 MED ORDER — METFORMIN HCL 1000 MG PO TABS: 1000.0000 mg | ORAL_TABLET | Freq: Two times a day (BID) | ORAL | Status: DC

## 2014-08-12 MED ORDER — LISINOPRIL 5 MG PO TABS: 5.0000 mg | ORAL_TABLET | Freq: Every day | ORAL | Status: DC

## 2014-08-12 MED ORDER — GLIPIZIDE ER 10 MG PO TB24: 10.0000 mg | ORAL_TABLET | Freq: Every day | ORAL | Status: DC

## 2014-08-12 MED ORDER — PRAVASTATIN SODIUM 40 MG PO TABS: 40.0000 mg | ORAL_TABLET | Freq: Every day | ORAL | Status: DC

## 2014-08-12 NOTE — Progress Notes (Signed)
Pre visit review using our clinic review tool, if applicable. No additional management support is needed unless otherwise documented below in the visit note. 

## 2014-08-12 NOTE — Assessment & Plan Note (Signed)

## 2014-08-12 NOTE — Assessment & Plan Note (Signed)
stable overall by history and exam, recent data reviewed with pt, and pt to continue medical treatment as before,  to f/u any worsening symptoms or concerns, has not had a1c with labs - will order Lab Results  Component Value Date   HGBA1C 7.4* 12/15/2013

## 2014-08-12 NOTE — Progress Notes (Signed)
Subjective:    Patient ID: Traci [name], female    DOB: 01-26-1960, 54 y.o.   MRN: [id number]  HPI  Here for wellness and f/u;  Overall doing ok;  Pt denies Chest pain, worsening SOB, DOE, wheezing, orthopnea, PND, worsening LE edema, palpitations, dizziness or syncope.  Pt denies neurological change such as new headache, facial or extremity weakness.  Pt denies polydipsia, polyuria, or low sugar symptoms. Pt states overall good compliance with treatment and medications, good tolerability, and has been trying to follow appropriate diet.  Pt denies worsening depressive symptoms, suicidal ideation or panic. No fever, night sweats, wt loss, loss of appetite, or other constitutional symptoms.  Pt states good ability with ADL's, has low fall risk, home safety reviewed and adequate, no other significant changes in hearing or vision, and only occasionally active with exercise. Has been more stressed to mult family with health issues such as sister with renal stones, fibroids, new DM and pulm nodules.  Brother has genetic issue with thrombophilia (father has same) and mult strokes in his 43's.  Another sister in [place] with thyroid dz (nonmalignant), Older brother with DM , HTN and died recently unclear reason at 53yo.   Past Medical History  Diagnosis Date  . ALLERGIC RHINITIS 07/07/2008  . CONTACT DERMATITIS 11/30/2009  . DIABETES MELLITUS, TYPE II 11/29/2006  . Dizziness and giddiness 04/26/2007  . HYPERLIPIDEMIA 01/07/2007  . HYPERTENSION 07/08/2007  . PLANTAR FASCIITIS, LEFT 08/05/2007  . Preventative health care 07/30/2010   Past Surgical History  Procedure Laterality Date  . Tubal ligation      reports that she has never smoked. She does not have any smokeless tobacco history on file. She reports that she drinks alcohol. She reports that she does not use illicit drugs. family history includes Cancer in her father and mother; Diabetes in her father; Stroke in her father and mother. Allergies    Allergen Reactions  . Penicillins     REACTION: Rash   Current Outpatient Prescriptions on File Prior to Visit  Medication Sig Dispense Refill  . aspirin 81 MG tablet Take 81 mg by mouth daily.      . fexofenadine (ALLEGRA) 180 MG tablet Take 180 mg by mouth daily.      . fluticasone (FLONASE) 50 MCG/ACT nasal spray Place 2 sprays into the nose daily.      Marland Kitchen glucose blood test strip Use as directed once daily to check blood sugar.  Diagnosis code 250.00 100 each 11  . Lancets MISC Use as directed once daily to check blood sugar.  Diagnosis code 250.00 100 each 11   No current facility-administered medications on file prior to visit.   Review of Systems Constitutional: Negative for increased diaphoresis, other activity, appetite or siginficant weight change other than noted HENT: Negative for worsening hearing loss, ear pain, facial swelling, mouth sores and neck stiffness.   Eyes: Negative for other worsening pain, redness or visual disturbance.  Respiratory: Negative for shortness of breath and wheezing  Cardiovascular: Negative for chest pain and palpitations.  Gastrointestinal: Negative for diarrhea, blood in stool, abdominal distention or other pain Genitourinary: Negative for hematuria, flank pain or change in urine volume.  Musculoskeletal: Negative for myalgias or other joint complaints.  Skin: Negative for color change and wound or drainage.  Neurological: Negative for syncope and numbness. other than noted Hematological: Negative for adenopathy. or other swelling Psychiatric/Behavioral: Negative for hallucinations, SI, self-injury, decreased concentration or other worsening agitation.  Objective:   Physical Exam BP 132/78 mmHg  Pulse 73  Temp(Src) 98.1 F (36.7 C) (Oral)  Ht 5\' 2"  (1.575 m)  Wt 195 lb (88.451 kg)  BMI 35.66 kg/m2  SpO2 97% VS noted,  Constitutional: Pt is oriented to person, place, and time. Appears well-developed and well-nourished, in no  significant distress Head: Normocephalic and atraumatic.  Right Ear: External ear normal.  Left Ear: External ear normal.  Nose: Nose normal.  Mouth/Throat: Oropharynx is clear and moist.  Eyes: Conjunctivae and EOM are normal. Pupils are equal, round, and reactive to light.  Neck: Normal [name] of motion. Neck supple. No JVD present. No tracheal deviation present or significant neck LA or mass Cardiovascular: Normal rate, regular rhythm, normal heart sounds and intact distal pulses.   Pulmonary/Chest: Effort normal and breath sounds without rales or wheezing  Abdominal: Soft. Bowel sounds are normal. NT. No HSM  Musculoskeletal: Normal [name] of motion. Exhibits no edema.  Lymphadenopathy:  Has no cervical adenopathy.  Neurological: Pt is alert and oriented to person, place, and time. Pt has normal reflexes. No cranial nerve deficit. Motor grossly intact Skin: Skin is warm and dry. No rash noted.  Psychiatric:  Has normal mood and affect. Behavior is normal.     Assessment & Plan:

## 2014-08-12 NOTE — Patient Instructions (Signed)

## 2014-08-13 ENCOUNTER — Ambulatory Visit (AMBULATORY_SURGERY_CENTER): Payer: Self-pay

## 2014-08-13 VITALS — Ht 62.0 in | Wt 195.8 lb

## 2014-08-13 DIAGNOSIS — Z1211 Encounter for screening for malignant neoplasm of colon: Secondary | ICD-10-CM

## 2014-08-13 MED ORDER — SUPREP BOWEL PREP KIT 17.5-3.13-1.6 GM/177ML PO SOLN: 1.0000 | Freq: Once | ORAL | Status: DC

## 2014-08-13 NOTE — Progress Notes (Signed)
No allergies to eggs or soy No diet/weight loss meds No home oxygen No past problems with anesthesia except sometimes difficult to arouse, N/V  Has email  Emmi instructions given for colonoscopy

## 2014-08-20 ENCOUNTER — Encounter: Payer: Self-pay | Admitting: Gastroenterology

## 2014-08-20 ENCOUNTER — Ambulatory Visit (AMBULATORY_SURGERY_CENTER): Payer: BLUE CROSS/BLUE SHIELD | Admitting: Gastroenterology

## 2014-08-20 VITALS — BP 104/61 | HR 78 | Temp 99.3°F | Resp 24 | Ht 62.0 in | Wt 195.0 lb

## 2014-08-20 DIAGNOSIS — D120 Benign neoplasm of cecum: Secondary | ICD-10-CM | POA: Diagnosis not present

## 2014-08-20 DIAGNOSIS — K5730 Diverticulosis of large intestine without perforation or abscess without bleeding: Secondary | ICD-10-CM

## 2014-08-20 DIAGNOSIS — Z1211 Encounter for screening for malignant neoplasm of colon: Secondary | ICD-10-CM | POA: Diagnosis not present

## 2014-08-20 MED ORDER — SODIUM CHLORIDE 0.9 % IV SOLN: 500.0000 mL | INTRAVENOUS | Status: DC

## 2014-08-20 NOTE — Op Note (Signed)
[facility]  [address] [place], [address]   COLONOSCOPY PROCEDURE REPORT  PATIENT: Traci, [name]  MR#: [id number] BIRTHDATE: 03/08/1960 , 54  yrs. old GENDER: female ENDOSCOPIST: Inda Castle, MD REFERRED PH:XTAVW John, M.D. PROCEDURE DATE:  08/20/2014 PROCEDURE:   Colonoscopy, screening, Colonoscopy with snare polypectomy, and Submucosal injection, any substance First Screening Colonoscopy - Avg.  risk and is 50 yrs.  old or older Yes.  Prior Negative Screening - Now for repeat screening. N/A  History of Adenoma - Now for follow-up colonoscopy & has been > or = to 3 yrs.  N/A  Polyps removed today? Yes ASA CLASS:   Class II INDICATIONS:Colorectal Neoplasm Risk Assessment for this procedure is average risk. MEDICATIONS: Monitored anesthesia care and Propofol 175 mg IV  DESCRIPTION OF PROCEDURE:   After the risks benefits and alternatives of the procedure were thoroughly explained, informed consent was obtained.  The digital rectal exam revealed no abnormalities of the rectum.   The [id number]  endoscope was introduced through the anus and advanced to the cecum, which was identified by both the appendix and ileocecal valve. No adverse events experienced.   The quality of the prep was (Suprep was used) excellent.  The instrument was then slowly withdrawn as the colon was fully examined. Estimated blood loss is zero unless otherwise noted in this procedure report.      COLON FINDINGS: A sessile polyp measuring 20 mm in size was found at the cecum.  A saline Injection 10cc was given to lift the mucosal wall.  A polypectomy was performed in a piecemeal fashion using snare cautery.  Polyp remnants were removed using hot forceps.  The resection was complete, the polyp tissue was completely retrieved and sent to histology.  A single endoscopic clip was placed to close the mucosal filling defect in an effort to minimize bleeding risk.  There was  mild diverticulosis noted in the sigmoid colon. Retroflexed views revealed no abnormalities. The time to cecum = 1.1 Withdrawal time = 14.4   The scope was withdrawn and the procedure completed. COMPLICATIONS: There were no immediate complications.  ENDOSCOPIC IMPRESSION: 1.   Sessile polyp was found at the cecum; saline was given to lift the mucosal wall.; polypectomy was performed in a piecemeal fashion using snare cautery; polypectomy was performed using hot forceps 2.   There was mild diverticulosis noted in the sigmoid colon  RECOMMENDATIONS: Colonoscopy  with ERBE 1 year pending pathology  eSigned:  Inda Castle, MD 08/20/2014 8:50 AM   cc:   PATIENT NAME:  Traci, [name] MR#: [id number]

## 2014-08-20 NOTE — Progress Notes (Signed)
Called to room to assist during endoscopic procedure.  Patient ID and intended procedure confirmed with present staff. Received instructions for my participation in the procedure from the performing physician.  

## 2014-08-20 NOTE — Progress Notes (Signed)
Transferred to recovery room. A/O x3, pleased with MAC.  VSS.  Report to Annette, RN. 

## 2014-08-20 NOTE — Patient Instructions (Signed)
YOU HAD AN ENDOSCOPIC PROCEDURE TODAY AT [facility]:   Refer to the procedure report that was given to you for any specific questions about what was found during the examination.  If the procedure report does not answer your questions, please call your gastroenterologist to clarify.  If you requested that your care partner not be given the details of your procedure findings, then the procedure report has been included in a sealed envelope for you to review at your convenience later.  YOU SHOULD EXPECT: Some feelings of bloating in the abdomen. Passage of more gas than usual.  Walking can help get rid of the air that was put into your GI tract during the procedure and reduce the bloating. If you had a lower endoscopy (such as a colonoscopy or flexible sigmoidoscopy) you may notice spotting of blood in your stool or on the toilet paper. If you underwent a bowel prep for your procedure, you may not have a normal bowel movement for a few days.  Please Note:  You might notice some irritation and congestion in your nose or some drainage.  This is from the oxygen used during your procedure.  There is no need for concern and it should clear up in a day or so.  SYMPTOMS TO REPORT IMMEDIATELY:   Following lower endoscopy (colonoscopy or flexible sigmoidoscopy):  Excessive amounts of blood in the stool  Significant tenderness or worsening of abdominal pains  Swelling of the abdomen that is new, acute  Fever of 100F or higher   For urgent or emergent issues, a gastroenterologist can be reached at any hour by calling [phone].   DIET: Your first meal following the procedure should be a small meal and then it is ok to progress to your normal diet. Heavy or fried foods are harder to digest and may make you feel nauseous or bloated.  Likewise, meals heavy in dairy and vegetables can increase bloating.  Drink plenty of fluids but you should avoid alcoholic beverages for 24  hours.  ACTIVITY:  You should plan to take it easy for the rest of today and you should NOT DRIVE or use heavy machinery until tomorrow (because of the sedation medicines used during the test).    FOLLOW UP: Our staff will call the number listed on your records the next business day following your procedure to check on you and address any questions or concerns that you may have regarding the information given to you following your procedure. If we do not reach you, we will leave a message.  However, if you are feeling well and you are not experiencing any problems, there is no need to return our call.  We will assume that you have returned to your regular daily activities without incident.  If any biopsies were taken you will be contacted by phone or by letter within the next 1-3 weeks.  Please call us at [phone] if you have not heard about the biopsies in 3 weeks.    SIGNATURES/CONFIDENTIALITY: You and/or your care partner have signed paperwork which will be entered into your electronic medical record.  These signatures attest to the fact that that the information above on your After Visit Summary has been reviewed and is understood.  Full responsibility of the confidentiality of this discharge information lies with you and/or your care-partner.    Handouts were given to your care partner on polyps, diverticulosis, and a high fiber diet with liberal fluid intake You may resume  your current medications today.  Except hold all anti- inflammatory meds and aspirin for 2 weeks per Dr. Deatra Ina. Your blood sugar was 129 in the recovery room. Await biopsy results. Please call if any questions or concerns.

## 2014-08-20 NOTE — Progress Notes (Signed)
No problems noted in the recovery room. maw 

## 2014-08-23 ENCOUNTER — Telehealth: Payer: Self-pay

## 2014-08-23 NOTE — Telephone Encounter (Signed)
  Follow up Call-  Call back number 08/20/2014  Post procedure Call Back phone  # [phone]  Permission to leave phone message Yes     Patient questions:  Do you have a fever, pain , or abdominal swelling? No. Pain Score  0 *  Have you tolerated food without any problems? Yes.    Have you been able to return to your normal activities? Yes.    Do you have any questions about your discharge instructions: Diet   No. Medications  No. Follow up visit  No.  Do you have questions or concerns about your Care? No.  Actions: * If pain score is 4 or above: No action needed, pain <4.

## 2014-08-24 ENCOUNTER — Encounter: Payer: Self-pay | Admitting: Gastroenterology

## 2014-08-27 ENCOUNTER — Encounter: Payer: Self-pay | Admitting: Internal Medicine

## 2014-09-03 ENCOUNTER — Encounter: Payer: Self-pay | Admitting: Internal Medicine

## 2014-11-19 ENCOUNTER — Encounter: Payer: Self-pay | Admitting: Family

## 2014-11-19 ENCOUNTER — Ambulatory Visit (INDEPENDENT_AMBULATORY_CARE_PROVIDER_SITE_OTHER): Payer: BLUE CROSS/BLUE SHIELD | Admitting: Family

## 2014-11-19 VITALS — BP 118/68 | HR 84 | Temp 97.9°F | Resp 18 | Ht 62.0 in | Wt 191.0 lb

## 2014-11-19 DIAGNOSIS — R42 Dizziness and giddiness: Secondary | ICD-10-CM | POA: Insufficient documentation

## 2014-11-19 MED ORDER — NEOMYCIN-POLYMYXIN-HC 3.5-10000-1 OT SOLN: 4.0000 [drp] | Freq: Three times a day (TID) | OTIC | Status: DC

## 2014-11-19 NOTE — Assessment & Plan Note (Signed)
Dizziness most likely related to vertigo that is refractory to low dose meclizine. In office ECG performed secondary to dizziness and other symptoms shows normal sinus rhythm. Increase meclizine. Start cortisporin to treat for potential otitis externa. Follow up if symptoms worsen or fail to improve.

## 2014-11-19 NOTE — Progress Notes (Signed)
Pre visit review using our clinic review tool, if applicable. No additional management support is needed unless otherwise documented below in the visit note. 

## 2014-11-19 NOTE — Patient Instructions (Signed)
Thank you for choosing [facility].  Summary/Instructions:  Please increase your meclizine to 25 mg.  Start the ear drops for the next 5 days and if needed up to 7.   Your prescription(s) have been submitted to your pharmacy or been printed and provided for you. Please take as directed and contact our office if you believe you are having problem(s) with the medication(s) or have any questions.  If your symptoms worsen or fail to improve, please contact our office for further instruction, or in case of emergency go directly to the emergency room at the closest medical facility.

## 2014-11-19 NOTE — Progress Notes (Signed)
Subjective:    Patient ID: Traci [name], female    DOB: 24-Nov-1960, 54 y.o.   MRN: [id number]  Chief Complaint  Patient presents with  . Dizziness    having vertigo getting very dizzy which is causing her to throw up, has been taking the meclizine and has been going on for several days and doesn't know if its an ear infection, has a headache    HPI:  Traci [name] is a 54 y.o. female who  has a past medical history of ALLERGIC RHINITIS (07/07/2008); CONTACT DERMATITIS (11/30/2009); DIABETES MELLITUS, TYPE II (11/29/2006); Dizziness and giddiness (04/26/2007); HYPERLIPIDEMIA (01/07/2007); HYPERTENSION (07/08/2007); PLANTAR FASCIITIS, LEFT (08/05/2007); and Preventative health care (07/30/2010). and presents today for an acute office visit.  Associated symptoms of vertigo and feeling of dizziness with the severity enough to cause her to throw up has occurred 2x in the past week. Modifying factors include meclizine which helped during the initial event but has not helped for the most recent event. Described as feeling swimmy headed and also like the room is spinning at times. Also notes symptoms of left arm discomfort that is waxing and waning and some mild mid back discomfort which she relates to is most likely musculoskeletal. Does have some sinus pressure and denies ear pain but has had some discharge out of her left ear.   Allergies  Allergen Reactions  . Penicillins     REACTION: Rash     Current Outpatient Prescriptions on File Prior to Visit  Medication Sig Dispense Refill  . aspirin 81 MG tablet Take 81 mg by mouth daily.      Marland Kitchen glipiZIDE (GLUCOTROL XL) 10 MG 24 hr tablet Take 1 tablet (10 mg total) by mouth daily. 90 tablet 3  . glucose blood test strip Use as directed once daily to check blood sugar.  Diagnosis code 250.00 100 each 11  . Lancets MISC Use as directed once daily to check blood sugar.  Diagnosis code 250.00 100 each 11  . lisinopril (PRINIVIL,ZESTRIL) 5 MG tablet  Take 1 tablet (5 mg total) by mouth daily. 90 tablet 3  . meclizine (ANTIVERT) 12.5 MG tablet Take 1 tablet (12.5 mg total) by mouth 3 (three) times daily as needed. 40 tablet 1  . metFORMIN (GLUCOPHAGE) 1000 MG tablet Take 1 tablet (1,000 mg total) by mouth 2 (two) times daily with a meal. 180 tablet 3  . pravastatin (PRAVACHOL) 40 MG tablet Take 1 tablet (40 mg total) by mouth daily. 90 tablet 3   No current facility-administered medications on file prior to visit.     Past Surgical History  Procedure Laterality Date  . Tubal ligation    . Mouth surgery      teeth extraction    Review of Systems  Constitutional: Negative for fever and chills.  HENT: Positive for ear discharge and sinus pressure. Negative for ear pain.   Respiratory: Negative for cough, chest tightness and shortness of breath.   Cardiovascular: Negative for chest pain, palpitations and leg swelling.  Neurological: Positive for dizziness and light-headedness.      Objective:    BP 118/68 mmHg  Pulse 84  Temp(Src) 97.9 F (36.6 C) (Oral)  Resp 18  Ht 5\' 2"  (1.575 m)  Wt 191 lb (86.637 kg)  BMI 34.93 kg/m2  SpO2 98% Nursing note and vital signs reviewed.  Physical Exam  Constitutional: She is oriented to person, place, and time. She appears well-developed and well-nourished. No distress.  HENT:  Right Ear: Hearing, tympanic membrane, external ear and ear canal normal.  Left Ear: Hearing, tympanic membrane and external ear normal.  Mild canal redness and inflammation of questionable infection.   Eyes: Conjunctivae and EOM are normal. Pupils are equal, round, and reactive to light.  Cardiovascular: Normal rate, regular rhythm, normal heart sounds and intact distal pulses.   Pulmonary/Chest: Effort normal and breath sounds normal.  Neurological: She is alert and oriented to person, place, and time. No cranial nerve deficit.  Skin: Skin is warm and dry.  Psychiatric: She has a normal mood and affect. Her  behavior is normal. Judgment and thought content normal.       Assessment & Plan:   Problem List Items Addressed This Visit      Other   Vertigo   Dizziness - Primary    Dizziness most likely related to vertigo that is refractory to low dose meclizine. In office ECG performed secondary to dizziness and other symptoms shows normal sinus rhythm. Increase meclizine. Start cortisporin to treat for potential otitis externa. Follow up if symptoms worsen or fail to improve.       Relevant Medications   neomycin-polymyxin-hydrocortisone (CORTISPORIN) otic solution   Other Relevant Orders   EKG 12-Lead (Completed)

## 2015-02-10 ENCOUNTER — Other Ambulatory Visit (INDEPENDENT_AMBULATORY_CARE_PROVIDER_SITE_OTHER): Payer: No Typology Code available for payment source

## 2015-02-10 DIAGNOSIS — E119 Type 2 diabetes mellitus without complications: Secondary | ICD-10-CM | POA: Diagnosis not present

## 2015-02-10 LAB — LIPID PANEL
CHOLESTEROL: 145 mg/dL (ref 0–200)
HDL: 46 mg/dL (ref >39.00)
LDL Cholesterol: 77 mg/dL (ref 0–99)
NonHDL: 99.24
TRIGLYCERIDES: 109 mg/dL (ref 0.0–149.0)
Total CHOL/HDL Ratio: 3
VLDL: 21.8 mg/dL (ref 0.0–40.0)

## 2015-02-10 LAB — HEPATIC FUNCTION PANEL
ALT: 30 U/L (ref 0–35)
AST: 25 U/L (ref 0–37)
Albumin: 4.1 g/dL (ref 3.5–5.2)
Alkaline Phosphatase: 57 U/L (ref 39–117)
Bilirubin, Direct: 0.1 mg/dL (ref 0.0–0.3)
TOTAL PROTEIN: 7.2 g/dL (ref 6.0–8.3)
Total Bilirubin: 0.3 mg/dL (ref 0.2–1.2)

## 2015-02-10 LAB — HEMOGLOBIN A1C: HEMOGLOBIN A1C: 7.3 % — AB (ref 4.6–6.5)

## 2015-02-10 LAB — BASIC METABOLIC PANEL
BUN: 12 mg/dL (ref 6–23)
CHLORIDE: 106 meq/L (ref 96–112)
CO2: 26 meq/L (ref 19–32)
CREATININE: 0.72 mg/dL (ref 0.40–1.20)
Calcium: 9.3 mg/dL (ref 8.4–10.5)
GFR: 89.78 mL/min (ref >60.00)
GLUCOSE: 165 mg/dL — AB (ref 70–99)
POTASSIUM: 4.7 meq/L (ref 3.5–5.1)
Sodium: 140 mEq/L (ref 135–145)

## 2015-02-15 ENCOUNTER — Encounter: Payer: Self-pay | Admitting: Internal Medicine

## 2015-02-15 ENCOUNTER — Ambulatory Visit (INDEPENDENT_AMBULATORY_CARE_PROVIDER_SITE_OTHER): Payer: No Typology Code available for payment source | Admitting: Internal Medicine

## 2015-02-15 VITALS — BP 136/78 | HR 90 | Temp 98.4°F | Resp 20 | Ht 62.0 in | Wt 197.0 lb

## 2015-02-15 DIAGNOSIS — Z0189 Encounter for other specified special examinations: Secondary | ICD-10-CM | POA: Diagnosis not present

## 2015-02-15 DIAGNOSIS — I10 Essential (primary) hypertension: Secondary | ICD-10-CM | POA: Diagnosis not present

## 2015-02-15 DIAGNOSIS — E785 Hyperlipidemia, unspecified: Secondary | ICD-10-CM

## 2015-02-15 DIAGNOSIS — E119 Type 2 diabetes mellitus without complications: Secondary | ICD-10-CM

## 2015-02-15 DIAGNOSIS — Z0000 Encounter for general adult medical examination without abnormal findings: Secondary | ICD-10-CM

## 2015-02-15 NOTE — Patient Instructions (Addendum)
Please continue all other medications as before, and refills have been done if requested.  Please have the pharmacy call with any other refills you may need.  Please continue your efforts at being more active, low cholesterol diet, and weight control.  You are otherwise up to date with prevention measures today.  Please keep your appointments with your specialists as you may have planned  Please return in 6 months, or sooner if needed, with Lab testing done 3-5 days before  

## 2015-02-15 NOTE — Progress Notes (Signed)
Pre visit review using our clinic review tool, if applicable. No additional management support is needed unless otherwise documented below in the visit note. 

## 2015-02-15 NOTE — Progress Notes (Signed)
Subjective:    Patient ID: Traci [name], female    DOB: Sep 12, 1960, 55 y.o.   MRN: [id number]  HPI  Here to f/u; overall doing ok,  Pt denies chest pain, increasing sob or doe, wheezing, orthopnea, PND, increased LE swelling, palpitations, dizziness or syncope.  Pt denies new neurological symptoms such as new headache, or facial or extremity weakness or numbness.  Pt denies polydipsia, polyuria, or low sugar episode.   Pt denies new neurological symptoms such as new headache, or facial or extremity weakness or numbness.   Pt states overall good compliance with meds, mostly trying to follow appropriate diet, with wt overall stable,  but little exercise however. Wt Readings from Last 3 Encounters:  02/15/15 197 lb (89.359 kg)  11/19/14 191 lb (86.637 kg)  08/20/14 195 lb (88.451 kg)   Past Medical History  Diagnosis Date  . ALLERGIC RHINITIS 07/07/2008  . CONTACT DERMATITIS 11/30/2009  . DIABETES MELLITUS, TYPE II 11/29/2006  . Dizziness and giddiness 04/26/2007  . HYPERLIPIDEMIA 01/07/2007  . HYPERTENSION 07/08/2007  . PLANTAR FASCIITIS, LEFT 08/05/2007  . Preventative health care 07/30/2010   Past Surgical History  Procedure Laterality Date  . Tubal ligation    . Mouth surgery      teeth extraction    reports that she has never smoked. She has never used smokeless tobacco. She reports that she drinks alcohol. She reports that she does not use illicit drugs. family history includes Bipolar disorder in her maternal aunt; Cancer in her father, maternal aunt, and mother; Depression in her mother; Diabetes in her father, maternal aunt, maternal uncle, paternal aunt, and paternal uncle; Fibroids in her maternal aunt and paternal aunt; Glaucoma in her mother; Heart disease in her father and mother; Hyperlipidemia in her father, mother, and paternal aunt; Hypertension in her father, maternal uncle, mother, and paternal aunt; Raynaud syndrome in her mother; Stroke in her father, mother, and  paternal uncle; Thrombophilia in her father and paternal uncle; Thyroid disease in her paternal aunt; Varicose Veins in her mother. There is no history of Colon cancer or Colon polyps. Allergies  Allergen Reactions  . Penicillins     REACTION: Rash   Current Outpatient Prescriptions on File Prior to Visit  Medication Sig Dispense Refill  . aspirin 81 MG tablet Take 81 mg by mouth daily.      Marland Kitchen glipiZIDE (GLUCOTROL XL) 10 MG 24 hr tablet Take 1 tablet (10 mg total) by mouth daily. 90 tablet 3  . glucose blood test strip Use as directed once daily to check blood sugar.  Diagnosis code 250.00 100 each 11  . Lancets MISC Use as directed once daily to check blood sugar.  Diagnosis code 250.00 100 each 11  . lisinopril (PRINIVIL,ZESTRIL) 5 MG tablet Take 1 tablet (5 mg total) by mouth daily. 90 tablet 3  . meclizine (ANTIVERT) 12.5 MG tablet Take 1 tablet (12.5 mg total) by mouth 3 (three) times daily as needed. 40 tablet 1  . metFORMIN (GLUCOPHAGE) 1000 MG tablet Take 1 tablet (1,000 mg total) by mouth 2 (two) times daily with a meal. 180 tablet 3  . neomycin-polymyxin-hydrocortisone (CORTISPORIN) otic solution Place 4 drops into the left ear 3 (three) times daily. 10 mL 0  . pravastatin (PRAVACHOL) 40 MG tablet Take 1 tablet (40 mg total) by mouth daily. 90 tablet 3   No current facility-administered medications on file prior to visit.   Review of Systems  Constitutional: Negative for unusual diaphoresis or  night sweats HENT: Negative for ringing in ear or discharge Eyes: Negative for double vision or worsening visual disturbance.  Respiratory: Negative for choking and stridor.   Gastrointestinal: Negative for vomiting or other signifcant bowel change Genitourinary: Negative for hematuria or change in urine volume.  Musculoskeletal: Negative for other MSK pain or swelling Skin: Negative for color change and worsening wound.  Neurological: Negative for tremors and numbness other than noted    Psychiatric/Behavioral: Negative for decreased concentration or agitation other than above      Objective:   Physical Exam BP 136/78 mmHg  Pulse 90  Temp(Src) 98.4 F (36.9 C) (Oral)  Resp 20  Ht 5\' 2"  (1.575 m)  Wt 197 lb (89.359 kg)  BMI 36.02 kg/m2  SpO2 98% VS noted,  Constitutional: Pt appears in no significant distress HENT: Head: NCAT.  Right Ear: External ear normal.  Left Ear: External ear normal.  Eyes: . Pupils are equal, round, and reactive to light. Conjunctivae and EOM are normal Neck: Normal [name] of motion. Neck supple.  Cardiovascular: Normal rate and regular rhythm.   Pulmonary/Chest: Effort normal and breath sounds without rales or wheezing.  Abd:  Soft, NT, ND, + BS Neurological: Pt is alert. Not confused , motor grossly intact Skin: Skin is warm. No rash, no LE edema Psychiatric: Pt behavior is normal. No agitation.     Assessment & Plan:

## 2015-02-20 NOTE — Assessment & Plan Note (Signed)
stable overall by history and exam, recent data reviewed with pt, and pt to continue medical treatment as before,  to f/u any worsening symptoms or concerns BP Readings from Last 3 Encounters:  02/15/15 136/78  11/19/14 118/68  08/20/14 104/61

## 2015-02-20 NOTE — Assessment & Plan Note (Signed)
stable overall by history and exam, recent data reviewed with pt, and pt to continue medical treatment as before,  to f/u any worsening symptoms or concerns Lab Results  Component Value Date   LDLCALC 77 02/10/2015

## 2015-02-20 NOTE — Assessment & Plan Note (Signed)
stable overall by history and exam, recent data reviewed with pt, and pt to continue medical treatment as before,  to f/u any worsening symptoms or concerns Lab Results  Component Value Date   HGBA1C 7.3* 02/10/2015

## 2015-04-10 ENCOUNTER — Emergency Department
Admission: EM | Admit: 2015-04-10 | Discharge: 2015-04-11 | Disposition: A | Discharge status: Home / Self Care | Payer: Commercial Managed Care - PPO | Attending: Emergency Medicine | Admitting: Emergency Medicine

## 2015-04-10 ENCOUNTER — Encounter (HOSPITAL_COMMUNITY): Payer: Self-pay

## 2015-04-10 DIAGNOSIS — R109 Unspecified abdominal pain: Secondary | ICD-10-CM

## 2015-04-10 DIAGNOSIS — R1033 Periumbilical pain: Principal | ICD-10-CM | POA: Insufficient documentation

## 2015-04-10 DIAGNOSIS — K769 Liver disease, unspecified: Secondary | ICD-10-CM

## 2015-04-10 DIAGNOSIS — K529 Noninfective gastroenteritis and colitis, unspecified: Secondary | ICD-10-CM | POA: Insufficient documentation

## 2015-04-10 DIAGNOSIS — R079 Chest pain, unspecified: Secondary | ICD-10-CM

## 2015-04-10 DIAGNOSIS — R112 Nausea with vomiting, unspecified: Secondary | ICD-10-CM | POA: Insufficient documentation

## 2015-04-10 DIAGNOSIS — I10 Essential (primary) hypertension: Secondary | ICD-10-CM

## 2015-04-10 LAB — BASIC METABOLIC PANEL, BLOOD
Anion Gap: 17 mmol/L — ABNORMAL HIGH (ref 7–15)
BUN: 27 mg/dL — ABNORMAL HIGH (ref 6–20)
Bicarbonate: 20 mmol/L — ABNORMAL LOW (ref 22–29)
Calcium: 9.6 mg/dL (ref 8.5–10.6)
Chloride: 106 mmol/L (ref 98–107)
Creatinine: 0.81 mg/dL (ref 0.51–0.95)
GFR: >60 mL/min
Glucose: 116 mg/dL — ABNORMAL HIGH (ref 70–99)
Potassium: 3.9 mmol/L (ref 3.5–5.1)
Sodium: 143 mmol/L (ref 136–145)

## 2015-04-10 LAB — CBC WITH DIFF, BLOOD
ANC-Automated: 6.4 10*3/uL (ref 1.6–7.0)
Abs Lymphs: 1.8 10*3/uL (ref 0.8–3.1)
Abs Monos: 0.3 10*3/uL (ref 0.2–0.8)
Eosinophils: 1 %
Hct: 46.1 % — ABNORMAL HIGH (ref 34.0–45.0)
Hgb: 15.4 gm/dL (ref 11.2–15.7)
Imm Gran %: 1 % (ref <1)
Lymphocytes: 21 %
MCH: 26.6 pg (ref 26.0–32.0)
MCHC: 33.4 % (ref 32.0–36.0)
MCV: 79.5 um3 (ref 79.0–95.0)
MPV: 9.6 fL (ref 9.4–12.4)
Monocytes: 4 %
Plt Count: 225 10*3/uL (ref 140–370)
RBC: 5.8 10*6/uL — ABNORMAL HIGH (ref 3.90–5.20)
RDW: 14.5 % — ABNORMAL HIGH (ref 12.0–14.0)
Segs: 75 %
WBC: 8.6 10*3/uL (ref 4.0–10.0)

## 2015-04-10 LAB — MAGNESIUM, BLOOD: Magnesium: 2 mg/dL (ref 1.6–2.6)

## 2015-04-10 LAB — LIVER PANEL, BLOOD
ALT (SGPT): 18 U/L (ref 0–33)
AST (SGOT): 25 U/L (ref 0–32)
Albumin: 3.3 g/dL — ABNORMAL LOW (ref 3.5–5.2)
Alkaline Phos: 84 U/L (ref 35–140)
Bilirubin, Dir: <0.2 mg/dL (ref <0.2)
Bilirubin, Tot: 0.24 mg/dL (ref <1.20)
Total Protein: 6 g/dL (ref 6.0–8.0)

## 2015-04-10 LAB — LIPASE, BLOOD: Lipase: 26 U/L (ref 13–60)

## 2015-04-10 LAB — TROPONIN T, BLOOD: Troponin T: <0.01 ng/mL (ref <0.01)

## 2015-04-10 MED ORDER — LACTATED RINGERS IV SOLN
Freq: Once | INTRAVENOUS | Status: AC
Start: 2015-04-10 — End: 2015-04-10
  Administered 2015-04-10: 21:00:00 via INTRAVENOUS

## 2015-04-10 MED ORDER — MORPHINE SULFATE 4 MG/ML IJ SOLN
4.00 mg | Freq: Once | INTRAMUSCULAR | Status: AC
Start: 2015-04-10 — End: 2015-04-10
  Administered 2015-04-10: 4 mg via INTRAVENOUS
  Filled 2015-04-10: qty 1

## 2015-04-10 MED ORDER — ONDANSETRON HCL 4 MG/2ML IV SOLN
4.00 mg | Freq: Once | INTRAMUSCULAR | Status: AC
Start: 2015-04-10 — End: 2015-04-10
  Administered 2015-04-10: 4 mg via INTRAVENOUS
  Filled 2015-04-10: qty 2

## 2015-04-10 NOTE — ED Notes (Signed)
Pt ambulatory to restroom w/ steady gait, c/o increasing "sciatic" back pain.  Urine specimen obtained.  [facility].  NSR on cardiac monitor. VSS, in NAD, will continue to monitor.

## 2015-04-10 NOTE — ED Notes (Signed)
Per MD Smyres and cardiology no acute STEMI at this time given our EKG.

## 2015-04-10 NOTE — ED Notes (Signed)
12 lead ECG completed. 1 copy placed in pt binder, 1 copy handed to MD Smyres with no previous from [facility].

## 2015-04-10 NOTE — ED EKG Interpretation (Signed)
ED EKG Interpretation  Sinus @ 74bpm, normal axis, PR interval 130ms, QRS duration 62ms, QTc=46588ms; No acute ST elevations/depressions or TWI

## 2015-04-10 NOTE — ED Notes (Signed)
Pt reports feeling much better, denies any abd pain at this time.  IVFs infusing. VSS, in NAD, will continue to monitor.

## 2015-04-10 NOTE — ED Notes (Signed)
Bed: 05C  Expected date:   Expected time:   Means of arrival:   Comments:  [id number] STEMI

## 2015-04-10 NOTE — ED Notes (Signed)
MD Christella NoaSmyres, MD Sherryll BurgerShah and Cardiology MD at bedside on arrival of pt to ED.   [facility].   Pt placed on monitor. Pt with abdominal pain to umbilical area. Pt experiencing N/V while in flight for the past 3 hours. Pt reports emesis x20.  Pt received Zofran and ASA prior to arrival.   Pre hospital EKG showed **STEMI ** with inferior wall ischemia.   Pt denies CP and SOB at this time.

## 2015-04-10 NOTE — ED Provider Notes (Signed)
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  Symptoms had been occurring for the past 3 hours, and pt ultimately reports vomiting approximately 20 times (nonbloody / nonbilious).  No fevers, chills, body aches, sore throat, cough, SOB, chest pain or palpitations.  Pt states pain currently is 6/10 intensity and located in the periumbilical area.  Denies dysuria, frequency, urgency or blood in stool.    ROS: Patient's medical history has been reviewed today as available in EPIC chart.  Additionally all pertinent positives and negatives on review-of-systems are discussed in HPI above.  All other systems were reviewed and are negative.    PMHx: No h/o HTN, DM, CAD  PSHx: No h/o major surgeries  SHx: Denies cigarette smoking, EtOH abuse, or illicit drug use  FAMHx: Denies family history of diabetes, HTN, stroke, heart problems.  MEDS:  Denies taking daily medications  ALL: NKDA    PE  Initial ED Vitals:  T=98.0; BP=148/83; HR=79; RR=20; Sat=93% room air (slightly low)  GEN: NAD; A&Ox3  SKIN: no rash or petechiae  HEENT: perrl; eomi; mmm  NECK: supple  CV: RRR; S1+S2 nl; no m/r/g  LUNGS: CTAB  ABD: soft; mild RLQ tenderness and LLQ tenderness w/o rebound/guarding; +BS  BACK: no CVAT  EXT: no swelling or deformity to extremities  NEURO: full +5/5 strength throughout with intact fine-touch sensation; no dysmetria; normal gait    Pertinent Test Results:  - EKG: See ED interpretation note  - CBC: no major acute abnormalities  - BMP: no major acute abnormalities  - LFTs: no major acute abnormalities  - Lipase - 26 normal  - Trop < 0.01  - Mg 2.0   - UPT negative    - CXR - 04/10/2015 - IMPRESSION: No acute cardiopulmonary disease.    - CT Abdomen/Pelvis - 04/10/2015 - IMPRESSION: Nonspecific prominence of the main pancreatic, intra and extrahepatic biliary tree without discrete pancreatic head  mass. An MRCP may be obtained to further  characterize these findings. Nonspecific hepatic dome hypodensity. Nonspecific thickening of the rectum. Differentials include colitis with other etiologies not excluded. Colonoscopy may be of help in further characterizing this.     Assessment / Plan / ED Course / Medical Decision Making:  Overall this is a 55 y/o Female with no PMHx presenting with vomiting, nausea and periumbical pain -- now found to have some lower abdominal tenderness on exam.  No urinary symptoms.  Pt denies chest pain or SOB, however pre-hospital personnel called possible STEMI based on 12-Lead EKG.  On ED arrival, pt's EKG does not appear to show STEMI, therefore this was called off.    Pt ultimately thought to have intra-abdominal process that may be responsible for her symptoms.  Treated with IV fluids, antiemetics, and analgesics.  Pt symptomatically feeling improved.  CT Abdomen performed given pt's localized abdominal tenderness.  Disposition pending.      Note: Further ED course and ultimate disposition may be discussed in another ED Progress Note as needed.     Myrtice LauthShah, Nekeisha Aure Jagdish, MD  04/10/15 [phone]

## 2015-04-11 LAB — ECG 12-LEAD
ATRIAL RATE: 74 {beats}/min
ATRIAL RATE: 89 {beats}/min
ECG INTERPRETATION: NORMAL
ECG INTERPRETATION: NORMAL
P AXIS: 50 degrees
P AXIS: 70 degrees
PR INTERVAL: 130 ms
PR INTERVAL: 132 ms
QRS INTERVAL/DURATION: 62 ms
QRS INTERVAL/DURATION: 62 ms
QT: 418 ms
QT: 440 ms
QTC INTERVAL: 488 ms
QTC INTERVAL: 508 ms
R AXIS: -3 degrees
R AXIS: 41 degrees
T AXIS: 54 degrees
T AXIS: 57 degrees
VENTRICULAR RATE: 74 {beats}/min
VENTRICULAR RATE: 89 {beats}/min

## 2015-04-11 LAB — TROPONIN T, BLOOD: Troponin T: <0.01 ng/mL (ref <0.01)

## 2015-04-11 MED ORDER — HYDROCODONE-ACETAMINOPHEN 5-325 MG OR TABS
1.00 | ORAL_TABLET | Freq: Four times a day (QID) | ORAL | 0 refills | Status: AC | PRN
Start: 2015-04-11 — End: ?

## 2015-04-11 MED ORDER — ONDANSETRON 4 MG OR TBDP
4.00 mg | ORAL_TABLET | Freq: Three times a day (TID) | ORAL | 0 refills | Status: AC | PRN
Start: 2015-04-11 — End: ?

## 2015-04-11 MED ORDER — SENNA 8.6 MG OR TABS
8.60 mg | ORAL_TABLET | Freq: Every day | ORAL | 0 refills | Status: AC
Start: 2015-04-11 — End: ?

## 2015-04-11 MED ORDER — AMOXICILLIN-POT CLAVULANATE 875-125 MG OR TABS
1.00 | ORAL_TABLET | Freq: Two times a day (BID) | ORAL | 0 refills | Status: AC
Start: 2015-04-11 — End: ?

## 2015-04-11 MED ORDER — LIDOCAINE 5 % EX PTCH
1.0000 | MEDICATED_PATCH | Freq: Once | CUTANEOUS | Status: DC
Start: 2015-04-11 — End: 2015-04-11
  Administered 2015-04-11: 1 via TRANSDERMAL
  Filled 2015-04-11: qty 1

## 2015-04-11 NOTE — ED MD Progress Note (Signed)
55 yo F with n/v while in flight. EKG normal. Troponin negative. CT abdomen for lower abd tenderness. Normal lipase. ?biliary dilitation. Rx for augmentin, PCP f/u as outpatient.

## 2015-04-11 NOTE — ED MD Progress Note (Signed)
CT    IMPRESSION:  Nonspecific prominence of the main pancreatic, intra and extrahepatic biliary   tree without discrete pancreatic head mass. An MRCP may be obtained to further  characterize these findings.    Nonspecific hepatic dome hypodensity.    Nonspecific thickening of the rectum. Differentials include colitis with other  etiologies not excluded. Colonoscopy may be of help in further characterizing  this.    Repeat trop neg.    Discussed and printed out the CT findings for the patient.  Specifically addressed the nonspecific prominence of the pancreatic, extrahepatic biliary tree, with recommendation for MRCP, hepatic dome have hypodensity, as well as colitis among other potential etiologies of rectal thickening.  Encouraged follow-up with outpatient gastroenterologist and primary care doctor for likely MRCP, colonoscopy.  Patient was already previously aware of the hepatic dome have hypodensity.  Will discharge the patient home on Augmentin for colitis, give some nausea and pain medications, and return precautions.  Patient feeling better, with reassuring vitals, and agrees with this plan.

## 2015-04-11 NOTE — Discharge Instructions (Signed)
Please follow up with Gastroenterology as an outpatient for consideration of a colonoscopy as well as a MRCP given the findings on your CT scan.  Please take the antibiotic as prescribed.  Please go to the nearest hospital with worsened abdominal pain, fevers, or any other new or concerning symptoms.    Abdominal Pain    You have been diagnosed with abdominal (belly) pain. The cause of your pain is not yet known.    Many things can cause abdominal pain. Examples include viral infections and bowel (intestine) spasms. You might need another examination or more tests to find out why you have pain.    At this time, your pain does not seem to be caused by anything dangerous. You do not need surgery. You do not need to stay in the hospital.     Though we dont believe your condition is dangerous right now, it is important to be careful. Sometimes a problem that seems mild can become serious later. This is why it is very important that you return here or go to the nearest Emergency Department unless you are 100% improved.    YOU SHOULD SEEK MEDICAL ATTENTION IMMEDIATELY, EITHER HERE OR AT THE NEAREST EMERGENCY DEPARTMENT, IF ANY OF THE FOLLOWING OCCURS:   Your pain does not go away or gets worse.   You cannot keep fluids down or your vomit is dark green.    You vomit blood or see blood in your stool. Blood might be bright red or dark red. It can also be black and look like tar.   You have a fever (temperature higher than 100.84F / 38C) or shaking chills.   Your skin or eyes look yellow or your urine looks brown.   You have severe diarrhea.

## 2015-04-11 NOTE — ED Notes (Signed)
Pt reports feeling better, stable for d/c.

## 2015-04-11 NOTE — ED Notes (Signed)
Pt given food for PO trial.

## 2015-04-11 NOTE — ED Notes (Signed)
Assisting Rn: Chilton SiGreen top for troponin drawn from PIV, labeled at bedside with 2 pt id's and sent to lab. Pt verbalizes understanding that current plan of care includes awaiting results of second troponin and denies further questions at this time. Respirations even and unlabored. Call light within reach. Will continue to monitor.

## 2015-04-13 NOTE — ED Follow-up Note (Signed)
Follow-up type: Callback       Routine ED Patient Call Back    Patient contacted by telephone:  found no issues; patient doing fine.

## 2015-05-30 ENCOUNTER — Other Ambulatory Visit: Payer: Self-pay | Admitting: Internal Medicine

## 2015-06-30 ENCOUNTER — Encounter: Payer: Self-pay | Admitting: Internal Medicine

## 2015-08-09 ENCOUNTER — Other Ambulatory Visit (INDEPENDENT_AMBULATORY_CARE_PROVIDER_SITE_OTHER): Payer: No Typology Code available for payment source

## 2015-08-09 DIAGNOSIS — Z0000 Encounter for general adult medical examination without abnormal findings: Secondary | ICD-10-CM

## 2015-08-09 DIAGNOSIS — E119 Type 2 diabetes mellitus without complications: Secondary | ICD-10-CM | POA: Diagnosis not present

## 2015-08-09 DIAGNOSIS — Z0189 Encounter for other specified special examinations: Secondary | ICD-10-CM

## 2015-08-09 LAB — HEPATIC FUNCTION PANEL
ALT: 38 U/L — ABNORMAL HIGH (ref 0–35)
AST: 32 U/L (ref 0–37)
Albumin: 4 g/dL (ref 3.5–5.2)
Alkaline Phosphatase: 54 U/L (ref 39–117)
BILIRUBIN DIRECT: 0 mg/dL (ref 0.0–0.3)
BILIRUBIN TOTAL: 0.3 mg/dL (ref 0.2–1.2)
Total Protein: 7 g/dL (ref 6.0–8.3)

## 2015-08-09 LAB — CBC WITH DIFFERENTIAL/PLATELET
BASOS ABS: 0 10*3/uL (ref 0.0–0.1)
BASOS PCT: 0.5 % (ref 0.0–3.0)
EOS PCT: 2.7 % (ref 0.0–5.0)
Eosinophils Absolute: 0.2 10*3/uL (ref 0.0–0.7)
HEMATOCRIT: 39.3 % (ref 36.0–46.0)
Hemoglobin: 13.2 g/dL (ref 12.0–15.0)
LYMPHS PCT: 33.8 % (ref 12.0–46.0)
Lymphs Abs: 2.2 10*3/uL (ref 0.7–4.0)
MCHC: 33.5 g/dL (ref 30.0–36.0)
MCV: 86.8 fl (ref 78.0–100.0)
MONOS PCT: 5.9 % (ref 3.0–12.0)
Monocytes Absolute: 0.4 10*3/uL (ref 0.1–1.0)
NEUTROS ABS: 3.8 10*3/uL (ref 1.4–7.7)
Neutrophils Relative %: 57.1 % (ref 43.0–77.0)
PLATELETS: 228 10*3/uL (ref 150.0–400.0)
RBC: 4.53 Mil/uL (ref 3.87–5.11)
RDW: 13.9 % (ref 11.5–15.5)
WBC: 6.6 10*3/uL (ref 4.0–10.5)

## 2015-08-09 LAB — URINALYSIS, ROUTINE W REFLEX MICROSCOPIC
Bilirubin Urine: NEGATIVE
Hgb urine dipstick: NEGATIVE
KETONES UR: NEGATIVE
Leukocytes, UA: NEGATIVE
Nitrite: NEGATIVE
RBC / HPF: NONE SEEN (ref 0–?)
SPECIFIC GRAVITY, URINE: 1.025 (ref 1.000–1.030)
Total Protein, Urine: NEGATIVE
UROBILINOGEN UA: 0.2 (ref 0.0–1.0)
Urine Glucose: NEGATIVE
pH: 5 (ref 5.0–8.0)

## 2015-08-09 LAB — BASIC METABOLIC PANEL
BUN: 12 mg/dL (ref 6–23)
CO2: 26 mEq/L (ref 19–32)
CREATININE: 0.73 mg/dL (ref 0.40–1.20)
Calcium: 9.3 mg/dL (ref 8.4–10.5)
Chloride: 105 mEq/L (ref 96–112)
GFR: 88.2 mL/min (ref >60.00)
GLUCOSE: 183 mg/dL — AB (ref 70–99)
POTASSIUM: 5.1 meq/L (ref 3.5–5.1)
Sodium: 139 mEq/L (ref 135–145)

## 2015-08-09 LAB — LIPID PANEL
CHOL/HDL RATIO: 3
CHOLESTEROL: 142 mg/dL (ref 0–200)
HDL: 43.3 mg/dL (ref >39.00)
LDL CALC: 71 mg/dL (ref 0–99)
NonHDL: 98.8
TRIGLYCERIDES: 141 mg/dL (ref 0.0–149.0)
VLDL: 28.2 mg/dL (ref 0.0–40.0)

## 2015-08-09 LAB — MICROALBUMIN / CREATININE URINE RATIO
Creatinine,U: 121.2 mg/dL
Microalb Creat Ratio: 0.6 mg/g (ref 0.0–30.0)

## 2015-08-09 LAB — HEMOGLOBIN A1C: Hgb A1c MFr Bld: 7.4 % — ABNORMAL HIGH (ref 4.6–6.5)

## 2015-08-09 LAB — TSH: TSH: 2.2 u[IU]/mL (ref 0.35–4.50)

## 2015-08-11 ENCOUNTER — Telehealth: Payer: Self-pay

## 2015-08-11 NOTE — Telephone Encounter (Signed)
Per Dr Loletha Carrow recall pt needs to have colon at [name] with "special equipment"  Hx of polyps 2016

## 2015-08-16 ENCOUNTER — Encounter: Payer: Self-pay | Admitting: Internal Medicine

## 2015-08-16 ENCOUNTER — Ambulatory Visit (INDEPENDENT_AMBULATORY_CARE_PROVIDER_SITE_OTHER): Payer: No Typology Code available for payment source | Admitting: Internal Medicine

## 2015-08-16 VITALS — BP 136/78 | HR 93 | Temp 98.0°F | Resp 20 | Wt 198.0 lb

## 2015-08-16 DIAGNOSIS — E785 Hyperlipidemia, unspecified: Secondary | ICD-10-CM | POA: Diagnosis not present

## 2015-08-16 DIAGNOSIS — J309 Allergic rhinitis, unspecified: Secondary | ICD-10-CM | POA: Diagnosis not present

## 2015-08-16 DIAGNOSIS — Z23 Encounter for immunization: Secondary | ICD-10-CM | POA: Diagnosis not present

## 2015-08-16 DIAGNOSIS — Z0001 Encounter for general adult medical examination with abnormal findings: Secondary | ICD-10-CM

## 2015-08-16 DIAGNOSIS — R6889 Other general symptoms and signs: Secondary | ICD-10-CM

## 2015-08-16 DIAGNOSIS — I10 Essential (primary) hypertension: Secondary | ICD-10-CM

## 2015-08-16 DIAGNOSIS — Z0000 Encounter for general adult medical examination without abnormal findings: Secondary | ICD-10-CM

## 2015-08-16 DIAGNOSIS — E119 Type 2 diabetes mellitus without complications: Secondary | ICD-10-CM

## 2015-08-16 MED ORDER — LINAGLIPTIN 5 MG PO TABS: 5.0000 mg | ORAL_TABLET | Freq: Every day | ORAL | 3 refills | Status: DC

## 2015-08-16 NOTE — Assessment & Plan Note (Addendum)
Mild uncontrolled, to add tradjenta 5 qd, cont all other meds, diet, wt loss efforts,  to f/u any worsening symptoms or concerns  In addition to the time spent performing CPE, I spent an additional 25 minutes face to face,in which greater than 50% of this time was spent in counseling and coordination of care for patient's acute illness as documented.

## 2015-08-16 NOTE — Assessment & Plan Note (Signed)
For otc zyrtec and nasacort asd,  to f/u any worsening symptoms or concerns

## 2015-08-16 NOTE — Progress Notes (Signed)
Pre visit review using our clinic review tool, if applicable. No additional management support is needed unless otherwise documented below in the visit note. 

## 2015-08-16 NOTE — Assessment & Plan Note (Signed)
stable overall by history and exam, recent data reviewed with pt, and pt to continue medical treatment as before,  to f/u any worsening symptoms or concerns Lab Results  Component Value Date   LDLCALC 71 08/09/2015

## 2015-08-16 NOTE — Progress Notes (Signed)
Subjective:    Patient ID: [name], female    DOB: [birth year], 55 y.o.   MRN: [id number]  HPI  Here for wellness and f/u;  Overall doing ok;  Pt denies Chest pain, worsening SOB, DOE, wheezing, orthopnea, PND, worsening LE edema, palpitations, dizziness or syncope.  Pt denies neurological change such as new headache, facial or extremity weakness. Pt states overall good compliance with treatment and medications, good tolerability, and has been trying to follow appropriate diet.  Pt denies worsening depressive symptoms, suicidal ideation or panic. No fever, night sweats, wt loss, loss of appetite, or other constitutional symptoms.  Pt states good ability with ADL's, has low fall risk, home safety reviewed and adequate, no other significant changes in hearing or vision, and only occasionally active with exercise.     Pt denies polydipsia, polyuria, or low sugar symptoms such as weakness or confusion improved with po intake.  Pt states overall good compliance with meds, trying to follow lower cholesterol, diabetic diet, wt overall stable but little exercise however.   Does have several wks ongoing nasal allergy symptoms with clearish congestion, itch and sneezing, without fever, pain, ST, cough, swelling or wheezing. Past Medical History:  Diagnosis Date  . ALLERGIC RHINITIS 07/07/2008  . CONTACT DERMATITIS 11/30/2009  . DIABETES MELLITUS, TYPE II 11/29/2006  . Dizziness and giddiness 04/26/2007  . HYPERLIPIDEMIA 01/07/2007  . HYPERTENSION 07/08/2007  . PLANTAR FASCIITIS, LEFT 08/05/2007  . Preventative health care 07/30/2010   Past Surgical History:  Procedure Laterality Date  . MOUTH SURGERY     teeth extraction  . TUBAL LIGATION      reports that she has never smoked. She has never used smokeless tobacco. She reports that she drinks alcohol. She reports that she does not use drugs. family history includes Bipolar disorder in her maternal aunt; Cancer in her father, maternal aunt, and  mother; Depression in her mother; Diabetes in her father, maternal aunt, maternal uncle, paternal aunt, and paternal uncle; Fibroids in her maternal aunt and paternal aunt; Glaucoma in her mother; Heart disease in her father and mother; Hyperlipidemia in her father, mother, and paternal aunt; Hypertension in her father, maternal uncle, mother, and paternal aunt; Raynaud syndrome in her mother; Stroke in her father, mother, and paternal uncle; Thrombophilia in her father and paternal uncle; Thyroid disease in her paternal aunt; Varicose Veins in her mother. Allergies  Allergen Reactions  . Penicillins     REACTION: Rash   Current Outpatient Prescriptions on File Prior to Visit  Medication Sig Dispense Refill  . aspirin 81 MG tablet Take 81 mg by mouth daily.      Marland Kitchen glipiZIDE (GLUCOTROL XL) 10 MG 24 hr tablet Take 1 tablet (10 mg total) by mouth daily. 90 tablet 3  . glucose blood test strip Use as directed once daily to check blood sugar.  Diagnosis code 250.00 100 each 11  . Lancets MISC Use as directed once daily to check blood sugar.  Diagnosis code 250.00 100 each 11  . lisinopril (PRINIVIL,ZESTRIL) 5 MG tablet Take 1 tablet (5 mg total) by mouth daily. 90 tablet 3  . meclizine (ANTIVERT) 12.5 MG tablet Take 1 tablet (12.5 mg total) by mouth 3 (three) times daily as needed. 40 tablet 1  . metFORMIN (GLUCOPHAGE) 1000 MG tablet TAKE 1 TABLET (1,000 MG TOTAL) BY MOUTH 2 (TWO) TIMES DAILY WITH A MEAL. 60 tablet 2  . neomycin-polymyxin-hydrocortisone (CORTISPORIN) otic solution Place 4 drops into the left ear 3 (  three) times daily. 10 mL 0  . pravastatin (PRAVACHOL) 40 MG tablet Take 1 tablet (40 mg total) by mouth daily. 90 tablet 3   No current facility-administered medications on file prior to visit.    Review of Systems Constitutional: Negative for increased diaphoresis, or other activity, appetite or siginficant weight change other than noted HENT: Negative for worsening hearing loss, ear  pain, facial swelling, mouth sores and neck stiffness.   Eyes: Negative for other worsening pain, redness or visual disturbance.  Respiratory: Negative for choking or stridor Cardiovascular: Negative for other chest pain and palpitations.  Gastrointestinal: Negative for worsening diarrhea, blood in stool, or abdominal distention Genitourinary: Negative for hematuria, flank pain or change in urine volume.  Musculoskeletal: Negative for myalgias or other joint complaints.  Skin: Negative for other color change and wound or drainage.  Neurological: Negative for syncope and numbness. other than noted Hematological: Negative for adenopathy. or other swelling Psychiatric/Behavioral: Negative for hallucinations, SI, self-injury, decreased concentration or other worsening agitation.      Objective:   Physical Exam BP 136/78   Pulse 93   Temp 98 F (36.7 C) (Oral)   Resp 20   Wt 198 lb (89.8 kg)   SpO2 96%   BMI 36.21 kg/m  VS noted,  Constitutional: Pt is oriented to person, place, and time. Appears well-developed and well-nourished, in no significant distress Head: Normocephalic and atraumatic  Eyes: Conjunctivae and EOM are normal. Pupils are equal, round, and reactive to light Right Ear: External ear normal.  Left Ear: External ear normal Nose: Nose normal.  Mouth/Throat: Oropharynx is clear and moist  Neck: Normal [name] of motion. Neck supple. No JVD present. No tracheal deviation present or significant neck LA or mass Bilat tm's with mild erythema.  Max sinus areas non tender.  Pharynx with mild erythema, no exudate Cardiovascular: Normal rate, regular rhythm, normal heart sounds and intact distal pulses.   Pulmonary/Chest: Effort normal and breath sounds without rales or wheezing  Abdominal: Soft. Bowel sounds are normal. NT. No HSM  Musculoskeletal: Normal [name] of motion. Exhibits no edema Lymphadenopathy: Has no cervical adenopathy.  Neurological: Pt is alert and oriented to  person, place, and time. Pt has normal reflexes. No cranial nerve deficit. Motor grossly intact Skin: Skin is warm and dry. No rash noted or new ulcers Psychiatric:  Has normal mood and affect. Behavior is normal.     Assessment & Plan:

## 2015-08-16 NOTE — Patient Instructions (Addendum)
You had the Prevenar pneumonia shot today  OK to try the OTC zyrtec and Nasacort for the allergies  Please take all new medication as prescribed - the tradjenta 5 mg per day  If this is not covered with your insurance, please have the pharmacy call with the name of a similar medication that is covered  Please continue all other medications as before, and refills have been done if requested.  Please have the pharmacy call with any other refills you may need.  Please continue your efforts at being more active, low cholesterol diet, and weight control.  You are otherwise up to date with prevention measures today.  Please keep your appointments with your specialists as you may have planned  Please return in 6 months, or sooner if needed, with Lab testing done 3-5 days before

## 2015-08-16 NOTE — Assessment & Plan Note (Signed)

## 2015-08-16 NOTE — Assessment & Plan Note (Signed)
stable overall by history and exam, recent data reviewed with pt, and pt to continue medical treatment as before,  to f/u any worsening symptoms or concerns BP Readings from Last 3 Encounters:  08/16/15 136/78  02/15/15 136/78  11/19/14 118/68

## 2015-08-24 NOTE — Telephone Encounter (Signed)
I spoke with the pt and she states she wants to call her insurance before she schedules, she will call back when she is ready to set that up.

## 2015-08-26 ENCOUNTER — Other Ambulatory Visit: Payer: Self-pay | Admitting: Internal Medicine

## 2015-08-31 ENCOUNTER — Other Ambulatory Visit: Payer: Self-pay | Admitting: Internal Medicine

## 2015-09-05 ENCOUNTER — Other Ambulatory Visit: Payer: Self-pay | Admitting: Internal Medicine

## 2015-09-05 NOTE — Telephone Encounter (Signed)
Pt called and wanted to know why her prescription of glipiZIDE (GLUCOTROL XL) 10 MG 24 hr tablet  was denied. Please follow up and give her a call back. Thanks.

## 2015-09-06 ENCOUNTER — Telehealth: Payer: Self-pay | Admitting: Internal Medicine

## 2015-09-06 MED ORDER — GLIPIZIDE ER 10 MG PO TB24: 10.0000 mg | ORAL_TABLET | Freq: Every day | ORAL | 3 refills | Status: DC

## 2015-09-06 NOTE — Telephone Encounter (Signed)
Called patient, medication was not denied it was sent in this morning for refill. Left message to give [place] a call back.

## 2015-09-06 NOTE — Telephone Encounter (Signed)
Done erx - the glipizide ER 10 qd  No need for change

## 2015-09-06 NOTE — Telephone Encounter (Signed)
Patient is out of glipizide 10mg .  States that pharmacy got a response back stating Dr. Jenny Reichmann was going to change this instead of refill it.  Patient needs to know more about medication.  Please follow up in regard.  Patient states she left a message with someone on 8/28 but there is no phone note in chart.

## 2015-10-21 ENCOUNTER — Other Ambulatory Visit: Payer: Self-pay | Admitting: Internal Medicine

## 2015-11-17 ENCOUNTER — Other Ambulatory Visit: Payer: Self-pay | Admitting: Internal Medicine

## 2015-12-14 ENCOUNTER — Other Ambulatory Visit: Payer: Self-pay | Admitting: Internal Medicine

## 2016-01-11 ENCOUNTER — Other Ambulatory Visit: Payer: Self-pay | Admitting: Internal Medicine

## 2016-02-09 ENCOUNTER — Other Ambulatory Visit: Payer: Self-pay | Admitting: Internal Medicine

## 2016-03-02 ENCOUNTER — Other Ambulatory Visit (INDEPENDENT_AMBULATORY_CARE_PROVIDER_SITE_OTHER): Payer: No Typology Code available for payment source

## 2016-03-02 DIAGNOSIS — E119 Type 2 diabetes mellitus without complications: Secondary | ICD-10-CM

## 2016-03-02 LAB — LIPID PANEL
CHOL/HDL RATIO: 3
CHOLESTEROL: 148 mg/dL (ref 0–200)
HDL: 43.3 mg/dL (ref >39.00)
LDL CALC: 77 mg/dL (ref 0–99)
NonHDL: 104.86
TRIGLYCERIDES: 140 mg/dL (ref 0.0–149.0)
VLDL: 28 mg/dL (ref 0.0–40.0)

## 2016-03-02 LAB — BASIC METABOLIC PANEL
BUN: 12 mg/dL (ref 6–23)
CO2: 26 mEq/L (ref 19–32)
CREATININE: 0.73 mg/dL (ref 0.40–1.20)
Calcium: 9.4 mg/dL (ref 8.4–10.5)
Chloride: 105 mEq/L (ref 96–112)
GFR: 88.02 mL/min (ref >60.00)
GLUCOSE: 180 mg/dL — AB (ref 70–99)
POTASSIUM: 4.8 meq/L (ref 3.5–5.1)
Sodium: 139 mEq/L (ref 135–145)

## 2016-03-02 LAB — HEPATIC FUNCTION PANEL
ALBUMIN: 4.2 g/dL (ref 3.5–5.2)
ALT: 55 U/L — ABNORMAL HIGH (ref 0–35)
AST: 48 U/L — AB (ref 0–37)
Alkaline Phosphatase: 55 U/L (ref 39–117)
BILIRUBIN TOTAL: 0.3 mg/dL (ref 0.2–1.2)
Bilirubin, Direct: 0.1 mg/dL (ref 0.0–0.3)
Total Protein: 7 g/dL (ref 6.0–8.3)

## 2016-03-02 LAB — HEMOGLOBIN A1C: Hgb A1c MFr Bld: 7.3 % — ABNORMAL HIGH (ref 4.6–6.5)

## 2016-03-07 ENCOUNTER — Ambulatory Visit (INDEPENDENT_AMBULATORY_CARE_PROVIDER_SITE_OTHER): Payer: No Typology Code available for payment source | Admitting: Internal Medicine

## 2016-03-07 VITALS — BP 118/64 | HR 117 | Temp 98.5°F | Ht 62.0 in | Wt 198.4 lb

## 2016-03-07 DIAGNOSIS — I10 Essential (primary) hypertension: Secondary | ICD-10-CM | POA: Diagnosis not present

## 2016-03-07 DIAGNOSIS — Z0001 Encounter for general adult medical examination with abnormal findings: Secondary | ICD-10-CM | POA: Diagnosis not present

## 2016-03-07 DIAGNOSIS — E119 Type 2 diabetes mellitus without complications: Secondary | ICD-10-CM | POA: Diagnosis not present

## 2016-03-07 DIAGNOSIS — E785 Hyperlipidemia, unspecified: Secondary | ICD-10-CM

## 2016-03-07 MED ORDER — PIOGLITAZONE HCL 45 MG PO TABS: 45.0000 mg | ORAL_TABLET | Freq: Every day | ORAL | 3 refills | Status: DC

## 2016-03-07 NOTE — Assessment & Plan Note (Signed)
stable overall by history and exam, recent data reviewed with pt, and pt to continue medical treatment as before,  to f/u any worsening symptoms or concerns Lab Results  Component Value Date   LDLCALC 77 03/02/2016

## 2016-03-07 NOTE — Progress Notes (Signed)
Subjective:    Patient ID: [name], female    DOB: 1960/08/07, 56 y.o.   MRN: [id number]  HPI  Here to f/u; overall doing ok,  Pt denies chest pain, increasing sob or doe, wheezing, orthopnea, PND, increased LE swelling, palpitations, dizziness or syncope.  Pt denies new neurological symptoms such as new headache, or facial or extremity weakness or numbness.  Pt denies polydipsia, polyuria, or low sugar episode.   Pt denies new neurological symptoms such as new headache, or facial or extremity weakness or numbness.   Pt states overall good compliance with meds, mostly trying to follow appropriate diet, with wt overall stable,  but little exercise however.  CBG's have been in lower 100's.  Wt Readings from Last 3 Encounters:  03/07/16 198 lb 6.4 oz (90 kg)  08/16/15 198 lb (89.8 kg)  02/15/15 197 lb (89.4 kg)   Past Medical History:  Diagnosis Date  . ALLERGIC RHINITIS 07/07/2008  . CONTACT DERMATITIS 11/30/2009  . DIABETES MELLITUS, TYPE II 11/29/2006  . Dizziness and giddiness 04/26/2007  . HYPERLIPIDEMIA 01/07/2007  . HYPERTENSION 07/08/2007  . PLANTAR FASCIITIS, LEFT 08/05/2007  . Preventative health care 07/30/2010   Past Surgical History:  Procedure Laterality Date  . MOUTH SURGERY     teeth extraction  . TUBAL LIGATION      reports that she has never smoked. She has never used smokeless tobacco. She reports that she drinks alcohol. She reports that she does not use drugs. family history includes Bipolar disorder in her maternal aunt; Cancer in her father, maternal aunt, and mother; Depression in her mother; Diabetes in her father, maternal aunt, maternal uncle, paternal aunt, and paternal uncle; Fibroids in her maternal aunt and paternal aunt; Glaucoma in her mother; Heart disease in her father and mother; Hyperlipidemia in her father, mother, and paternal aunt; Hypertension in her father, maternal uncle, mother, and paternal aunt; Raynaud syndrome in her mother; Stroke in her  father, mother, and paternal uncle; Thrombophilia in her father and paternal uncle; Thyroid disease in her paternal aunt; Varicose Veins in her mother. Allergies  Allergen Reactions  . Penicillins     REACTION: Rash   Current Outpatient Prescriptions on File Prior to Visit  Medication Sig Dispense Refill  . aspirin 81 MG tablet Take 81 mg by mouth daily.      Marland Kitchen glipiZIDE (GLUCOTROL XL) 10 MG 24 hr tablet Take 1 tablet (10 mg total) by mouth daily. 90 tablet 3  . glucose blood test strip Use as directed once daily to check blood sugar.  Diagnosis code 250.00 100 each 11  . Lancets MISC Use as directed once daily to check blood sugar.  Diagnosis code 250.00 100 each 11  . linagliptin (TRADJENTA) 5 MG TABS tablet Take 1 tablet (5 mg total) by mouth daily. 90 tablet 3  . lisinopril (PRINIVIL,ZESTRIL) 5 MG tablet TAKE ONE TABLET BY MOUTH ONCE DAILY 90 tablet 3  . meclizine (ANTIVERT) 12.5 MG tablet Take 1 tablet (12.5 mg total) by mouth 3 (three) times daily as needed. 40 tablet 1  . metFORMIN (GLUCOPHAGE) 1000 MG tablet TAKE 1 TABLET BY MOUTH TWO TIMES A DAY WITH A MEAL 60 tablet 0  . neomycin-polymyxin-hydrocortisone (CORTISPORIN) otic solution Place 4 drops into the left ear 3 (three) times daily. 10 mL 0  . pravastatin (PRAVACHOL) 40 MG tablet TAKE ONE TABLET BY MOUTH ONCE DAILY 30 tablet 11   No current facility-administered medications on file prior to visit.  Review of Systems  Constitutional: Negative for unusual diaphoresis or night sweats HENT: Negative for ear swelling or discharge Eyes: Negative for worsening visual haziness  Respiratory: Negative for choking and stridor.   Gastrointestinal: Negative for distension or worsening eructation Genitourinary: Negative for retention or change in urine volume.  Musculoskeletal: Negative for other MSK pain or swelling Skin: Negative for color change and worsening wound Neurological: Negative for tremors and numbness other than noted    Psychiatric/Behavioral: Negative for decreased concentration or agitation other than above   All other system neg per pt    Objective:   Physical Exam BP 118/64 (BP Location: Left Arm, Patient Position: Sitting, Cuff Size: Large)   Pulse (!) 117   Temp 98.5 F (36.9 C) (Oral)   Ht 5\' 2"  (1.575 m)   Wt 198 lb 6.4 oz (90 kg)   SpO2 98%   BMI 36.29 kg/m  VS noted,  Constitutional: Pt appears in no apparent distress HENT: Head: NCAT.  Right Ear: External ear normal.  Left Ear: External ear normal.  Eyes: . Pupils are equal, round, and reactive to light. Conjunctivae and EOM are normal Neck: Normal [name] of motion. Neck supple.  Cardiovascular: Normal rate and regular rhythm.   Pulmonary/Chest: Effort normal and breath sounds without rales or wheezing.  Neurological: Pt is alert. Not confused , motor grossly intact Skin: Skin is warm. No rash, no LE edema Psychiatric: Pt behavior is normal. No agitation.     Assessment & Plan:

## 2016-03-07 NOTE — Assessment & Plan Note (Signed)
stable overall by history and exam, recent data reviewed with pt, and pt to continue medical treatment as before,  to f/u any worsening symptoms or concerns BP Readings from Last 3 Encounters:  03/07/16 118/64  08/16/15 136/78  02/15/15 136/78

## 2016-03-07 NOTE — Assessment & Plan Note (Signed)
stable overall by history and exam, recent data reviewed with pt, and pt to continue medical treatment as before,  to f/u any worsening symptoms or concerns Lab Results  Component Value Date   HGBA1C 7.3 (H) 03/02/2016    

## 2016-03-07 NOTE — Patient Instructions (Signed)
Ok to increase the actos to 45 mg per day  Please continue all other medications as before, and refills have been done if requested.  Please have the pharmacy call with any other refills you may need.  Please continue your efforts at being more active, low cholesterol diet, and weight control.  Please keep your appointments with your specialists as you may have planned  Please return in 6 months, or sooner if needed, with Lab testing done 3-5 days before

## 2016-03-11 ENCOUNTER — Other Ambulatory Visit: Payer: Self-pay | Admitting: Internal Medicine

## 2016-05-01 ENCOUNTER — Encounter: Payer: Self-pay | Admitting: Internal Medicine

## 2016-05-01 ENCOUNTER — Ambulatory Visit (INDEPENDENT_AMBULATORY_CARE_PROVIDER_SITE_OTHER): Payer: No Typology Code available for payment source | Admitting: Internal Medicine

## 2016-05-01 VITALS — BP 130/70 | HR 88 | Temp 98.8°F | Resp 16 | Ht 62.0 in | Wt 201.1 lb

## 2016-05-01 DIAGNOSIS — J301 Allergic rhinitis due to pollen: Secondary | ICD-10-CM | POA: Diagnosis not present

## 2016-05-01 DIAGNOSIS — H6981 Other specified disorders of Eustachian tube, right ear: Secondary | ICD-10-CM | POA: Diagnosis not present

## 2016-05-01 DIAGNOSIS — J0100 Acute maxillary sinusitis, unspecified: Secondary | ICD-10-CM | POA: Diagnosis not present

## 2016-05-01 DIAGNOSIS — H6982 Other specified disorders of Eustachian tube, left ear: Secondary | ICD-10-CM | POA: Insufficient documentation

## 2016-05-01 MED ORDER — METHYLPREDNISOLONE 4 MG PO TBPK: ORAL_TABLET | ORAL | 0 refills | Status: DC

## 2016-05-01 MED ORDER — HYDROCODONE-HOMATROPINE 5-1.5 MG/5ML PO SYRP: 5.0000 mL | ORAL_SOLUTION | Freq: Three times a day (TID) | ORAL | 0 refills | Status: DC | PRN

## 2016-05-01 MED ORDER — CEFDINIR 300 MG PO CAPS: 300.0000 mg | ORAL_CAPSULE | Freq: Two times a day (BID) | ORAL | 0 refills | Status: DC

## 2016-05-01 NOTE — Progress Notes (Signed)
Pre visit review using our clinic review tool, if applicable. No additional management support is needed unless otherwise documented below in the visit note. 

## 2016-05-01 NOTE — Progress Notes (Signed)
Subjective:  Patient ID: Traci [name], female    DOB: 1960-09-30  Age: 56 y.o. MRN: [id number]  CC: Sinusitis and Allergic Rhinitis    HPI Traci [name] presents for a 1 week hx of sinus pain, yellow nasal phlegm, NP cough, muffled sensation in her right ear, and ST. She has taken Advil cold/sinus for sx relief.  Outpatient Medications Prior to Visit  Medication Sig Dispense Refill  . aspirin 81 MG tablet Take 81 mg by mouth daily.      Marland Kitchen glipiZIDE (GLUCOTROL XL) 10 MG 24 hr tablet Take 1 tablet (10 mg total) by mouth daily. 90 tablet 3  . glucose blood test strip Use as directed once daily to check blood sugar.  Diagnosis code 250.00 100 each 11  . Lancets MISC Use as directed once daily to check blood sugar.  Diagnosis code 250.00 100 each 11  . linagliptin (TRADJENTA) 5 MG TABS tablet Take 1 tablet (5 mg total) by mouth daily. 90 tablet 3  . lisinopril (PRINIVIL,ZESTRIL) 5 MG tablet TAKE ONE TABLET BY MOUTH ONCE DAILY 90 tablet 3  . meclizine (ANTIVERT) 12.5 MG tablet Take 1 tablet (12.5 mg total) by mouth 3 (three) times daily as needed. 40 tablet 1  . metFORMIN (GLUCOPHAGE) 1000 MG tablet TAKE 1 TABLET BY MOUTH TWO TIMES A DAY WITH A MEAL 60 tablet 3  . neomycin-polymyxin-hydrocortisone (CORTISPORIN) otic solution Place 4 drops into the left ear 3 (three) times daily. 10 mL 0  . pioglitazone (ACTOS) 45 MG tablet Take 1 tablet (45 mg total) by mouth daily. 90 tablet 3  . pravastatin (PRAVACHOL) 40 MG tablet TAKE ONE TABLET BY MOUTH ONCE DAILY 30 tablet 11   No facility-administered medications prior to visit.     ROS Review of Systems  Constitutional: Negative for chills, fatigue and fever.  HENT: Positive for congestion, ear pain, postnasal drip, rhinorrhea, sinus pain, sinus pressure, sneezing and sore throat. Negative for ear discharge, facial swelling, nosebleeds, trouble swallowing and voice change.   Eyes: Negative.   Respiratory: Positive for cough. Negative for chest  tightness, shortness of breath, wheezing and stridor.   Cardiovascular: Negative for chest pain, palpitations and leg swelling.  Gastrointestinal: Negative for abdominal pain, constipation, diarrhea, nausea and vomiting.  Endocrine: Negative.   Genitourinary: Negative.   Musculoskeletal: Negative.   Skin: Negative.  Negative for rash.  Allergic/Immunologic: Negative.   Neurological: Negative.   Hematological: Negative for adenopathy. Does not bruise/bleed easily.  Psychiatric/Behavioral: Negative.     Objective:  BP 130/70 (BP Location: Left Arm, Patient Position: Sitting, Cuff Size: Large)   Pulse 88   Temp 98.8 F (37.1 C) (Oral)   Resp 16   Ht 5\' 2"  (1.575 m)   Wt 201 lb 2 oz (91.2 kg)   SpO2 100%   BMI 36.79 kg/m   BP Readings from Last 3 Encounters:  05/01/16 130/70  03/07/16 118/64  08/16/15 136/78    Wt Readings from Last 3 Encounters:  05/01/16 201 lb 2 oz (91.2 kg)  03/07/16 198 lb 6.4 oz (90 kg)  08/16/15 198 lb (89.8 kg)    Physical Exam  Constitutional: She is oriented to person, place, and time.  Non-toxic appearance. She does not have a sickly appearance. She does not appear ill. No distress.  HENT:  Right Ear: Hearing, external ear and ear canal normal. No mastoid tenderness. Tympanic membrane is injected. Tympanic membrane is not scarred, not perforated, not erythematous, not retracted and not  bulging.  Left Ear: Hearing, tympanic membrane, external ear and ear canal normal.  Nose: Mucosal edema and rhinorrhea present. No sinus tenderness. No epistaxis. Right sinus exhibits maxillary sinus tenderness. Right sinus exhibits no frontal sinus tenderness. Left sinus exhibits no maxillary sinus tenderness and no frontal sinus tenderness.  Mouth/Throat: Oropharynx is clear and moist and mucous membranes are normal. Mucous membranes are not pale, not dry and not cyanotic. No oral lesions. No trismus in the jaw. No uvula swelling. No oropharyngeal exudate, posterior  oropharyngeal edema, posterior oropharyngeal erythema or tonsillar abscesses.  Eyes: Conjunctivae are normal. Right eye exhibits no discharge. Left eye exhibits no discharge. No scleral icterus.  Neck: Normal [name] of motion. Neck supple. No JVD present. No tracheal deviation present. No thyromegaly present.  Cardiovascular: Normal rate, regular rhythm, normal heart sounds and intact distal pulses.  Exam reveals no gallop and no friction rub.   No murmur heard. Pulmonary/Chest: Effort normal and breath sounds normal. No stridor. No respiratory distress. She has no wheezes. She has no rales. She exhibits no tenderness.  Abdominal: Soft. Bowel sounds are normal. She exhibits no distension and no mass. There is no tenderness. There is no rebound and no guarding.  Musculoskeletal: Normal [name] of motion. She exhibits no edema, tenderness or deformity.  Lymphadenopathy:    She has no cervical adenopathy.  Neurological: She is oriented to person, place, and time.  Skin: Skin is warm and dry. No rash noted. She is not diaphoretic. No erythema.  Vitals reviewed.   Lab Results  Component Value Date   WBC 6.6 08/09/2015   HGB 13.2 08/09/2015   HCT 39.3 08/09/2015   PLT 228.0 08/09/2015   GLUCOSE 180 (H) 03/02/2016   CHOL 148 03/02/2016   TRIG 140.0 03/02/2016   HDL 43.30 03/02/2016   LDLCALC 77 03/02/2016   ALT 55 (H) 03/02/2016   AST 48 (H) 03/02/2016   NA 139 03/02/2016   K 4.8 03/02/2016   CL 105 03/02/2016   CREATININE 0.73 03/02/2016   BUN 12 03/02/2016   CO2 26 03/02/2016   TSH 2.20 08/09/2015   HGBA1C 7.3 (H) 03/02/2016   MICROALBUR <0.7 08/09/2015    Converted [facility]  Result Date: 03/01/2010 Document Description: Imaging Report Order Summary: [facility] Imported By: Phillis Knack 02/20/2010 12:32:19 _____________________________________________________________________ External Attachment:   Type:   Image    Comment:   External  Document   Converted [facility]  Result Date: 03/01/2010 Document Description: Imaging Report Order Summary: [place] [facility] [place] [facility] Imported By: Bubba Hales 02/20/2010 09:59:31 _____________________________________________________________________ External Attachment:   Type:   Image    Comment:   External Document    Assessment & Plan:   Cleora was seen today for sinusitis and allergic rhinitis .  Diagnoses and all orders for this visit:  Seasonal allergic rhinitis due to pollen -     Discontinue: methylPREDNISolone (MEDROL DOSEPAK) 4 MG TBPK tablet; TAKE AS DIRECTED -     methylPREDNISolone (MEDROL DOSEPAK) 4 MG TBPK tablet; TAKE AS DIRECTED  Acute maxillary sinusitis, recurrence not specified -     Discontinue: cefdinir (OMNICEF) 300 MG capsule; Take 1 capsule (300 mg total) by mouth 2 (two) times daily. -     HYDROcodone-homatropine (HYCODAN) 5-1.5 MG/5ML syrup; Take 5 mLs by mouth every 8 (eight) hours as needed for cough. -     cefdinir (OMNICEF) 300 MG capsule; Take 1 capsule (300 mg total) by  mouth 2 (two) times daily.  Eustachian tube dysfunction, right -     Discontinue: methylPREDNISolone (MEDROL DOSEPAK) 4 MG TBPK tablet; TAKE AS DIRECTED -     methylPREDNISolone (MEDROL DOSEPAK) 4 MG TBPK tablet; TAKE AS DIRECTED   I am having Ms. Jamie start on HYDROcodone-homatropine. I am also having her maintain her aspirin, Lancets, glucose blood, meclizine, neomycin-polymyxin-hydrocortisone, linagliptin, lisinopril, pravastatin, glipiZIDE, pioglitazone, metFORMIN, methylPREDNISolone, and cefdinir.  Meds ordered this encounter  Medications  . DISCONTD: cefdinir (OMNICEF) 300 MG capsule    Sig: Take 1 capsule (300 mg total) by mouth 2 (two) times daily.    Dispense:  20 capsule    Refill:  0  . HYDROcodone-homatropine (HYCODAN) 5-1.5 MG/5ML syrup    Sig: Take 5 mLs by mouth every 8 (eight) hours as needed for cough.    Dispense:  120 mL     Refill:  0  . DISCONTD: methylPREDNISolone (MEDROL DOSEPAK) 4 MG TBPK tablet    Sig: TAKE AS DIRECTED    Dispense:  21 tablet    Refill:  0  . methylPREDNISolone (MEDROL DOSEPAK) 4 MG TBPK tablet    Sig: TAKE AS DIRECTED    Dispense:  21 tablet    Refill:  0  . cefdinir (OMNICEF) 300 MG capsule    Sig: Take 1 capsule (300 mg total) by mouth 2 (two) times daily.    Dispense:  20 capsule    Refill:  0     Follow-up: Return in about 3 weeks (around 05/22/2016).  Scarlette Calico, MD

## 2016-05-01 NOTE — Patient Instructions (Signed)

## 2016-06-22 LAB — HM MAMMOGRAPHY

## 2016-07-10 ENCOUNTER — Other Ambulatory Visit: Payer: Self-pay | Admitting: Internal Medicine

## 2016-07-27 ENCOUNTER — Ambulatory Visit (INDEPENDENT_AMBULATORY_CARE_PROVIDER_SITE_OTHER): Payer: No Typology Code available for payment source | Admitting: Internal Medicine

## 2016-07-27 ENCOUNTER — Encounter: Payer: Self-pay | Admitting: Internal Medicine

## 2016-07-27 VITALS — BP 140/84 | HR 94 | Ht 62.0 in | Wt 201.0 lb

## 2016-07-27 DIAGNOSIS — Z7189 Other specified counseling: Secondary | ICD-10-CM | POA: Diagnosis not present

## 2016-07-27 DIAGNOSIS — F419 Anxiety disorder, unspecified: Secondary | ICD-10-CM | POA: Diagnosis not present

## 2016-07-27 DIAGNOSIS — Z7184 Encounter for health counseling related to travel: Secondary | ICD-10-CM

## 2016-07-27 DIAGNOSIS — E119 Type 2 diabetes mellitus without complications: Secondary | ICD-10-CM | POA: Diagnosis not present

## 2016-07-27 DIAGNOSIS — I10 Essential (primary) hypertension: Secondary | ICD-10-CM

## 2016-07-27 MED ORDER — SCOPOLAMINE 1 MG/3DAYS TD PT72: 1.0000 | MEDICATED_PATCH | TRANSDERMAL | 0 refills | Status: DC

## 2016-07-27 MED ORDER — CIPROFLOXACIN HCL 500 MG PO TABS: 500.0000 mg | ORAL_TABLET | Freq: Two times a day (BID) | ORAL | 0 refills | Status: AC

## 2016-07-27 MED ORDER — ALPRAZOLAM 1 MG PO TABS: 1.0000 mg | ORAL_TABLET | Freq: Every day | ORAL | 0 refills | Status: DC | PRN

## 2016-07-27 NOTE — Patient Instructions (Signed)
Please take all new medication as prescribed  - the motion sickness patch, xanax and cipro if needed  Please continue all other medications as before, and refills have been done if requested.  Please have the pharmacy call with any other refills you may need  Please keep your appointments with your specialists as you may have planned

## 2016-07-27 NOTE — Progress Notes (Signed)
Subjective:    Patient ID: Traci [name], female    DOB: 1960/02/20, 56 y.o.   MRN: [id number]  HPI  Here to f/u; overall doing ok,  Pt denies chest pain, increasing sob or doe, wheezing, orthopnea, PND, increased LE swelling, palpitations, dizziness or syncope.  Pt denies new neurological symptoms such as new headache, or facial or extremity weakness or numbness.  Pt denies polydipsia, polyuria.  Has coming cruise to [place] in a few wks, asks for motion sickness and anxiety meds as she has fear of flying as well.  Pt denies fever, wt loss, night sweats, loss of appetite, or other constitutional symptoms Past Medical History:  Diagnosis Date  . ALLERGIC RHINITIS 07/07/2008  . CONTACT DERMATITIS 11/30/2009  . DIABETES MELLITUS, TYPE II 11/29/2006  . Dizziness and giddiness 04/26/2007  . HYPERLIPIDEMIA 01/07/2007  . HYPERTENSION 07/08/2007  . PLANTAR FASCIITIS, LEFT 08/05/2007  . Preventative health care 07/30/2010   Past Surgical History:  Procedure Laterality Date  . MOUTH SURGERY     teeth extraction  . TUBAL LIGATION      reports that she has never smoked. She has never used smokeless tobacco. She reports that she drinks alcohol. She reports that she does not use drugs. family history includes Bipolar disorder in her maternal aunt; Cancer in her father, maternal aunt, and mother; Depression in her mother; Diabetes in her father, maternal aunt, maternal uncle, paternal aunt, and paternal uncle; Fibroids in her maternal aunt and paternal aunt; Glaucoma in her mother; Heart disease in her father and mother; Hyperlipidemia in her father, mother, and paternal aunt; Hypertension in her father, maternal uncle, mother, and paternal aunt; Raynaud syndrome in her mother; Stroke in her father, mother, and paternal uncle; Thrombophilia in her father and paternal uncle; Thyroid disease in her paternal aunt; Varicose Veins in her mother. Allergies  Allergen Reactions  . Penicillins     REACTION: Rash    Current Outpatient Prescriptions on File Prior to Visit  Medication Sig Dispense Refill  . aspirin 81 MG tablet Take 81 mg by mouth daily.      . cefdinir (OMNICEF) 300 MG capsule Take 1 capsule (300 mg total) by mouth 2 (two) times daily. 20 capsule 0  . glipiZIDE (GLUCOTROL XL) 10 MG 24 hr tablet Take 1 tablet (10 mg total) by mouth daily. 90 tablet 3  . glucose blood test strip Use as directed once daily to check blood sugar.  Diagnosis code 250.00 100 each 11  . HYDROcodone-homatropine (HYCODAN) 5-1.5 MG/5ML syrup Take 5 mLs by mouth every 8 (eight) hours as needed for cough. 120 mL 0  . Lancets MISC Use as directed once daily to check blood sugar.  Diagnosis code 250.00 100 each 11  . linagliptin (TRADJENTA) 5 MG TABS tablet Take 1 tablet (5 mg total) by mouth daily. 90 tablet 3  . lisinopril (PRINIVIL,ZESTRIL) 5 MG tablet TAKE ONE TABLET BY MOUTH ONCE DAILY 90 tablet 3  . meclizine (ANTIVERT) 12.5 MG tablet Take 1 tablet (12.5 mg total) by mouth 3 (three) times daily as needed. 40 tablet 1  . metFORMIN (GLUCOPHAGE) 1000 MG tablet TAKE 1 TABLET BY MOUTH TWO TIMES A DAY WITH A MEAL 60 tablet 0  . methylPREDNISolone (MEDROL DOSEPAK) 4 MG TBPK tablet TAKE AS DIRECTED 21 tablet 0  . neomycin-polymyxin-hydrocortisone (CORTISPORIN) otic solution Place 4 drops into the left ear 3 (three) times daily. 10 mL 0  . pioglitazone (ACTOS) 45 MG tablet Take 1 tablet (45  mg total) by mouth daily. 90 tablet 3  . pravastatin (PRAVACHOL) 40 MG tablet TAKE ONE TABLET BY MOUTH ONCE DAILY 30 tablet 11   No current facility-administered medications on file prior to visit.    Review of Systems  Constitutional: Negative for other unusual diaphoresis or sweats HENT: Negative for ear discharge or swelling Eyes: Negative for other worsening visual disturbances Respiratory: Negative for stridor or other swelling  Gastrointestinal: Negative for worsening distension or other blood Genitourinary: Negative for  retention or other urinary change Musculoskeletal: Negative for other MSK pain or swelling Skin: Negative for color change or other new lesions Neurological: Negative for worsening tremors and other numbness  Psychiatric/Behavioral: Negative for worsening agitation or other fatigue All other system neg per pt    Objective:   Physical Exam BP 140/84   Pulse 94   Ht 5\' 2"  (1.575 m)   Wt 201 lb (91.2 kg)   SpO2 99%   BMI 36.76 kg/m  VS noted, obese Constitutional: Pt appears in NAD HENT: Head: NCAT.  Right Ear: External ear normal.  Left Ear: External ear normal.  Eyes: . Pupils are equal, round, and reactive to light. Conjunctivae and EOM are normal Nose: without d/c or deformity Neck: Neck supple. Gross normal ROM Cardiovascular: Normal rate and regular rhythm.   Pulmonary/Chest: Effort normal and breath sounds without rales or wheezing.  Abd:  Soft, NT, ND, + BS, no organomegaly Neurological: Pt is alert. At baseline orientation, motor grossly intact Skin: Skin is warm. No rashes, other new lesions, no LE edema Psychiatric: Pt behavior is normal without agitation , mild nervous    Assessment & Plan:

## 2016-07-29 DIAGNOSIS — Z7184 Encounter for health counseling related to travel: Secondary | ICD-10-CM | POA: Insufficient documentation

## 2016-07-29 DIAGNOSIS — F419 Anxiety disorder, unspecified: Secondary | ICD-10-CM | POA: Insufficient documentation

## 2016-07-29 NOTE — Assessment & Plan Note (Signed)
stable overall by history and exam, recent data reviewed with pt, and pt to continue medical treatment as before,  to f/u any worsening symptoms or concerns BP Readings from Last 3 Encounters:  07/27/16 140/84  05/01/16 130/70  03/07/16 118/64

## 2016-07-29 NOTE — Assessment & Plan Note (Signed)
Mild to mod fear of flying, has hx of panic prior, for xanax prn asd,  to f/u any worsening symptoms or concerns

## 2016-07-29 NOTE — Assessment & Plan Note (Signed)
Ok for transderm scopalmine asd, cipro prn diarrhea,  to f/u any worsening symptoms or concerns

## 2016-07-29 NOTE — Assessment & Plan Note (Signed)
stable overall by history and exam, recent data reviewed with pt, and pt to continue medical treatment as before,  to f/u any worsening symptoms or concerns Lab Results  Component Value Date   HGBA1C 7.3 (H) 03/02/2016

## 2016-08-06 ENCOUNTER — Other Ambulatory Visit: Payer: Self-pay | Admitting: Internal Medicine

## 2016-08-08 ENCOUNTER — Other Ambulatory Visit: Payer: Self-pay | Admitting: Internal Medicine

## 2016-08-24 ENCOUNTER — Other Ambulatory Visit (INDEPENDENT_AMBULATORY_CARE_PROVIDER_SITE_OTHER): Payer: No Typology Code available for payment source

## 2016-08-24 DIAGNOSIS — E119 Type 2 diabetes mellitus without complications: Secondary | ICD-10-CM

## 2016-08-24 DIAGNOSIS — Z0001 Encounter for general adult medical examination with abnormal findings: Secondary | ICD-10-CM | POA: Diagnosis not present

## 2016-08-24 LAB — BASIC METABOLIC PANEL
BUN: 16 mg/dL (ref 6–23)
CHLORIDE: 105 meq/L (ref 96–112)
CO2: 26 mEq/L (ref 19–32)
CREATININE: 0.78 mg/dL (ref 0.40–1.20)
Calcium: 9.2 mg/dL (ref 8.4–10.5)
GFR: 81.39 mL/min (ref >60.00)
Glucose, Bld: 145 mg/dL — ABNORMAL HIGH (ref 70–99)
Potassium: 4.8 mEq/L (ref 3.5–5.1)
SODIUM: 139 meq/L (ref 135–145)

## 2016-08-24 LAB — HEPATIC FUNCTION PANEL
ALK PHOS: 47 U/L (ref 39–117)
ALT: 31 U/L (ref 0–35)
AST: 29 U/L (ref 0–37)
Albumin: 3.9 g/dL (ref 3.5–5.2)
BILIRUBIN DIRECT: 0.1 mg/dL (ref 0.0–0.3)
BILIRUBIN TOTAL: 0.4 mg/dL (ref 0.2–1.2)
Total Protein: 6.6 g/dL (ref 6.0–8.3)

## 2016-08-24 LAB — URINALYSIS, ROUTINE W REFLEX MICROSCOPIC
BILIRUBIN URINE: NEGATIVE
HGB URINE DIPSTICK: NEGATIVE
KETONES UR: NEGATIVE
LEUKOCYTES UA: NEGATIVE
NITRITE: NEGATIVE
Specific Gravity, Urine: >=1.03 — AB (ref 1.000–1.030)
Total Protein, Urine: NEGATIVE
UROBILINOGEN UA: 0.2 (ref 0.0–1.0)
Urine Glucose: NEGATIVE
pH: 5.5 (ref 5.0–8.0)

## 2016-08-24 LAB — CBC WITH DIFFERENTIAL/PLATELET
BASOS PCT: 0.5 % (ref 0.0–3.0)
Basophils Absolute: 0 10*3/uL (ref 0.0–0.1)
EOS ABS: 0.1 10*3/uL (ref 0.0–0.7)
Eosinophils Relative: 1.7 % (ref 0.0–5.0)
HEMATOCRIT: 40.8 % (ref 36.0–46.0)
Hemoglobin: 13.5 g/dL (ref 12.0–15.0)
LYMPHS ABS: 1.7 10*3/uL (ref 0.7–4.0)
LYMPHS PCT: 26.4 % (ref 12.0–46.0)
MCHC: 33.2 g/dL (ref 30.0–36.0)
MCV: 89.8 fl (ref 78.0–100.0)
MONOS PCT: 5.9 % (ref 3.0–12.0)
Monocytes Absolute: 0.4 10*3/uL (ref 0.1–1.0)
NEUTROS ABS: 4.3 10*3/uL (ref 1.4–7.7)
NEUTROS PCT: 65.5 % (ref 43.0–77.0)
PLATELETS: 237 10*3/uL (ref 150.0–400.0)
RBC: 4.54 Mil/uL (ref 3.87–5.11)
RDW: 14.2 % (ref 11.5–15.5)
WBC: 6.5 10*3/uL (ref 4.0–10.5)

## 2016-08-24 LAB — HEMOGLOBIN A1C: HEMOGLOBIN A1C: 6.5 % (ref 4.6–6.5)

## 2016-08-24 LAB — LIPID PANEL
CHOL/HDL RATIO: 3
Cholesterol: 145 mg/dL (ref 0–200)
HDL: 43.9 mg/dL (ref >39.00)
LDL Cholesterol: 81 mg/dL (ref 0–99)
NONHDL: 101.05
Triglycerides: 100 mg/dL (ref 0.0–149.0)
VLDL: 20 mg/dL (ref 0.0–40.0)

## 2016-08-24 LAB — MICROALBUMIN / CREATININE URINE RATIO
CREATININE, U: 129.6 mg/dL
MICROALB/CREAT RATIO: 0.5 mg/g (ref 0.0–30.0)

## 2016-08-24 LAB — TSH: TSH: 2.86 u[IU]/mL (ref 0.35–4.50)

## 2016-09-04 ENCOUNTER — Encounter: Payer: Self-pay | Admitting: Internal Medicine

## 2016-09-04 ENCOUNTER — Ambulatory Visit (INDEPENDENT_AMBULATORY_CARE_PROVIDER_SITE_OTHER): Payer: No Typology Code available for payment source | Admitting: Internal Medicine

## 2016-09-04 VITALS — BP 128/78 | HR 91 | Temp 98.5°F | Ht 62.0 in | Wt 201.0 lb

## 2016-09-04 DIAGNOSIS — Z114 Encounter for screening for human immunodeficiency virus [HIV]: Secondary | ICD-10-CM | POA: Diagnosis not present

## 2016-09-04 DIAGNOSIS — E119 Type 2 diabetes mellitus without complications: Secondary | ICD-10-CM

## 2016-09-04 DIAGNOSIS — Z0000 Encounter for general adult medical examination without abnormal findings: Secondary | ICD-10-CM | POA: Diagnosis not present

## 2016-09-04 NOTE — Progress Notes (Signed)
Subjective:    Patient ID: Traci [name], female    DOB: 1960/11/16, 56 y.o.   MRN: [id number]  HPI b Here for wellness and f/u;  Overall doing ok;  Pt denies Chest pain, worsening SOB, DOE, wheezing, orthopnea, PND, worsening LE edema, palpitations, dizziness or syncope.  Pt denies neurological change such as new headache, facial or extremity weakness.  Pt denies polydipsia, polyuria, or low sugar symptoms. Pt states overall good compliance with treatment and medications, good tolerability, and has been trying to follow appropriate diet.  Pt denies worsening depressive symptoms, suicidal ideation or panic. No fever, night sweats, wt loss, loss of appetite, or other constitutional symptoms.  Pt states good ability with ADL's, has low fall risk, home safety reviewed and adequate, no other significant changes in hearing or vision, and only occasionally active with exercise.  No new complaints Wt Readings from Last 3 Encounters:  09/04/16 201 lb (91.2 kg)  07/27/16 201 lb (91.2 kg)  05/01/16 201 lb 2 oz (91.2 kg)  Just back last night with 14 hr trip, still with some leg swelling. Past Medical History:  Diagnosis Date  . ALLERGIC RHINITIS 07/07/2008  . CONTACT DERMATITIS 11/30/2009  . DIABETES MELLITUS, TYPE II 11/29/2006  . Dizziness and giddiness 04/26/2007  . HYPERLIPIDEMIA 01/07/2007  . HYPERTENSION 07/08/2007  . PLANTAR FASCIITIS, LEFT 08/05/2007  . Preventative health care 07/30/2010   Past Surgical History:  Procedure Laterality Date  . MOUTH SURGERY     teeth extraction  . TUBAL LIGATION      reports that she has never smoked. She has never used smokeless tobacco. She reports that she drinks alcohol. She reports that she does not use drugs. family history includes Bipolar disorder in her maternal aunt; Cancer in her father, maternal aunt, and mother; Depression in her mother; Diabetes in her father, maternal aunt, maternal uncle, paternal aunt, and paternal uncle; Fibroids in her  maternal aunt and paternal aunt; Glaucoma in her mother; Heart disease in her father and mother; Hyperlipidemia in her father, mother, and paternal aunt; Hypertension in her father, maternal uncle, mother, and paternal aunt; Raynaud syndrome in her mother; Stroke in her father, mother, and paternal uncle; Thrombophilia in her father and paternal uncle; Thyroid disease in her paternal aunt; Varicose Veins in her mother. Allergies  Allergen Reactions  . Penicillins     REACTION: Rash   Current Outpatient Prescriptions on File Prior to Visit  Medication Sig Dispense Refill  . ALPRAZolam (XANAX) 1 MG tablet Take 1 tablet (1 mg total) by mouth daily as needed for anxiety. 20 tablet 0  . aspirin 81 MG tablet Take 81 mg by mouth daily.      . cefdinir (OMNICEF) 300 MG capsule Take 1 capsule (300 mg total) by mouth 2 (two) times daily. 20 capsule 0  . glipiZIDE (GLUCOTROL XL) 10 MG 24 hr tablet Take 1 tablet (10 mg total) by mouth daily. 90 tablet 3  . glucose blood test strip Use as directed once daily to check blood sugar.  Diagnosis code 250.00 100 each 11  . HYDROcodone-homatropine (HYCODAN) 5-1.5 MG/5ML syrup Take 5 mLs by mouth every 8 (eight) hours as needed for cough. 120 mL 0  . Lancets MISC Use as directed once daily to check blood sugar.  Diagnosis code 250.00 100 each 11  . lisinopril (PRINIVIL,ZESTRIL) 5 MG tablet TAKE ONE TABLET BY MOUTH ONCE DAILY 90 tablet 3  . meclizine (ANTIVERT) 12.5 MG tablet Take 1 tablet (12.5  mg total) by mouth 3 (three) times daily as needed. 40 tablet 1  . metFORMIN (GLUCOPHAGE) 1000 MG tablet TAKE ONE TABLET BY MOUTH TWICE A DAY WITH A MEAL 60 tablet 5  . methylPREDNISolone (MEDROL DOSEPAK) 4 MG TBPK tablet TAKE AS DIRECTED 21 tablet 0  . neomycin-polymyxin-hydrocortisone (CORTISPORIN) otic solution Place 4 drops into the left ear 3 (three) times daily. 10 mL 0  . pioglitazone (ACTOS) 45 MG tablet Take 1 tablet (45 mg total) by mouth daily. 90 tablet 3  .  pravastatin (PRAVACHOL) 40 MG tablet TAKE ONE TABLET BY MOUTH ONCE DAILY 30 tablet 11  . scopolamine (TRANSDERM-SCOP, 1.5 MG,) 1 MG/3DAYS Place 1 patch (1.5 mg total) onto the skin every 3 (three) days. 8 patch 0  . TRADJENTA 5 MG TABS tablet TAKE ONE TABLET BY MOUTH DAILY 30 tablet 2   No current facility-administered medications on file prior to visit.    Review of Systems Constitutional: Negative for other unusual diaphoresis, sweats, appetite or weight changes HENT: Negative for other worsening hearing loss, ear pain, facial swelling, mouth sores or neck stiffness.   Eyes: Negative for other worsening pain, redness or other visual disturbance.  Respiratory: Negative for other stridor or swelling Cardiovascular: Negative for other palpitations or other chest pain  Gastrointestinal: Negative for worsening diarrhea or loose stools, blood in stool, distention or other pain Genitourinary: Negative for hematuria, flank pain or other change in urine volume.  Musculoskeletal: Negative for myalgias or other joint swelling.  Skin: Negative for other color change, or other wound or worsening drainage.  Neurological: Negative for other syncope or numbness. Hematological: Negative for other adenopathy or swelling Psychiatric/Behavioral: Negative for hallucinations, other worsening agitation, SI, self-injury, or new decreased concentration All other system neg per pt    Objective:   Physical Exam BP 128/78   Pulse 91   Temp 98.5 F (36.9 C)   Ht 5\' 2"  (1.575 m)   Wt 201 lb (91.2 kg)   SpO2 98%   BMI 36.76 kg/m  VS noted,  Constitutional: Pt is oriented to person, place, and time. Appears well-developed and well-nourished, in no significant distress and comfortable Head: Normocephalic and atraumatic  Eyes: Conjunctivae and EOM are normal. Pupils are equal, round, and reactive to light Right Ear: External ear normal without discharge Left Ear: External ear normal without discharge Nose:  Nose without discharge or deformity Mouth/Throat: Oropharynx is without other ulcerations and moist  Neck: Normal [name] of motion. Neck supple. No JVD present. No tracheal deviation present or significant neck LA or mass Cardiovascular: Normal rate, regular rhythm, normal heart sounds and intact distal pulses.   Pulmonary/Chest: WOB normal and breath sounds without rales or wheezing  Abdominal: Soft. Bowel sounds are normal. NT. No HSM  Musculoskeletal: Normal [name] of motion. Exhibits trace bilat LE edema Lymphadenopathy: Has no other cervical adenopathy.  Neurological: Pt is alert and oriented to person, place, and time. Pt has normal reflexes. No cranial nerve deficit. Motor grossly intact, Gait intact Skin: Skin is warm and dry. No rash noted or new ulcerations Psychiatric:  Has normal mood and affect. Behavior is normal without agitation No other exam findings    Assessment & Plan:

## 2016-09-04 NOTE — Patient Instructions (Addendum)
Please remember to see your GYN for your exam, and your yearly EYE exam  You will be contacted regarding the referral for: colonoscopy  Please continue all other medications as before, and refills have been done if requested.  Please have the pharmacy call with any other refills you may need.  Please continue your efforts at being more active, low cholesterol diet, and weight control.  You are otherwise up to date with prevention measures today.  Please keep your appointments with your specialists as you may have planned  Please return in 6 months, or sooner if needed, with Lab testing done 3-5 days before

## 2016-09-05 NOTE — Assessment & Plan Note (Signed)

## 2016-09-05 NOTE — Assessment & Plan Note (Signed)
Lab Results  Component Value Date   HGBA1C 6.5 08/24/2016  stable overall by history and exam, recent data reviewed with pt, and pt to continue medical treatment as before,  to f/u any worsening symptoms or concerns

## 2016-09-06 ENCOUNTER — Other Ambulatory Visit: Payer: Self-pay | Admitting: Internal Medicine

## 2016-09-18 ENCOUNTER — Other Ambulatory Visit: Payer: Self-pay | Admitting: Internal Medicine

## 2016-11-19 ENCOUNTER — Other Ambulatory Visit: Payer: Self-pay | Admitting: Internal Medicine

## 2017-02-02 ENCOUNTER — Ambulatory Visit: Payer: No Typology Code available for payment source | Admitting: Adult Health

## 2017-02-02 ENCOUNTER — Encounter: Payer: Self-pay | Admitting: Adult Health

## 2017-02-02 VITALS — BP 128/74 | HR 112 | Resp 18 | Wt 197.0 lb

## 2017-02-02 DIAGNOSIS — J0140 Acute pansinusitis, unspecified: Secondary | ICD-10-CM

## 2017-02-02 MED ORDER — DOXYCYCLINE HYCLATE 100 MG PO CAPS: 100.0000 mg | ORAL_CAPSULE | Freq: Two times a day (BID) | ORAL | 0 refills | Status: DC

## 2017-02-02 NOTE — Progress Notes (Signed)
Subjective:    Patient ID: Traci [name], female    DOB: [birth year], 57 y.o.   MRN: [id number]  URI   This is a new problem. The current episode started 1 to 4 weeks ago. The problem has been waxing and waning. The maximum temperature recorded prior to her arrival was 100.4 - 100.9 F. Associated symptoms include congestion, coughing (productive ), headaches, rhinorrhea, sinus pain and vomiting (dry heaves ). Pertinent negatives include no ear pain, sore throat or wheezing. She has tried decongestant for the symptoms. The treatment provided mild relief.      Review of Systems  Constitutional: Positive for activity change, chills, diaphoresis, fatigue and fever.  HENT: Positive for congestion, rhinorrhea and sinus pain. Negative for ear pain and sore throat.   Respiratory: Positive for cough (productive ). Negative for wheezing.   Gastrointestinal: Positive for vomiting (dry heaves ).  Genitourinary: Negative.   Neurological: Positive for headaches.   Past Medical History:  Diagnosis Date  . ALLERGIC RHINITIS 07/07/2008  . CONTACT DERMATITIS 11/30/2009  . DIABETES MELLITUS, TYPE II 11/29/2006  . Dizziness and giddiness 04/26/2007  . HYPERLIPIDEMIA 01/07/2007  . HYPERTENSION 07/08/2007  . PLANTAR FASCIITIS, LEFT 08/05/2007  . Preventative health care 07/30/2010    Social History   Socioeconomic History  . Marital status: Married    Spouse name: Not on file  . Number of children: 2  . Years of education: Not on file  . Highest education level: Not on file  Social Needs  . Financial resource strain: Not on file  . Food insecurity - worry: Not on file  . Food insecurity - inability: Not on file  . Transportation needs - medical: Not on file  . Transportation needs - non-medical: Not on file  Occupational History  . Occupation: Therapist, sports: [employer]  Tobacco Use  . Smoking status: Never Smoker  . Smokeless tobacco: Never Used  Substance and Sexual  Activity  . Alcohol use: Yes    Alcohol/week: 0.0 oz    Comment: 1-2 every 2-3 weeks  . Drug use: No  . Sexual activity: Not on file  Other Topics Concern  . Not on file  Social History Narrative  . Not on file    Past Surgical History:  Procedure Laterality Date  . MOUTH SURGERY     teeth extraction  . TUBAL LIGATION      Family History  Problem Relation Age of Onset  . Stroke Mother   . Cancer Mother        lung and breast cancer  . Depression Mother   . Heart disease Mother        Attack, Aneurysm  . Hypertension Mother   . Varicose Veins Mother   . Raynaud syndrome Mother   . Hyperlipidemia Mother   . Glaucoma Mother   . Cancer Father        prostate cancer  . Diabetes Father   . Stroke Father        x3  . Hyperlipidemia Father   . Hypertension Father   . Heart disease Father        bypass surgery  . Thrombophilia Father   . Bipolar disorder Maternal Aunt   . Cancer Maternal Aunt        Ovarian  . Fibroids Maternal Aunt   . Diabetes Maternal Aunt   . Hypertension Maternal Uncle   . Diabetes Maternal Uncle   . Diabetes Paternal Aunt   .  Hyperlipidemia Paternal Aunt   . Hypertension Paternal Aunt   . Fibroids Paternal Aunt   . Thyroid disease Paternal Aunt   . Diabetes Paternal Uncle   . Stroke Paternal Uncle   . Thrombophilia Paternal Uncle   . Colon cancer Neg Hx   . Colon polyps Neg Hx     Allergies  Allergen Reactions  . Penicillins     REACTION: Rash    Current Outpatient Medications on File Prior to Visit  Medication Sig Dispense Refill  . ALPRAZolam (XANAX) 1 MG tablet Take 1 tablet (1 mg total) by mouth daily as needed for anxiety. 20 tablet 0  . aspirin 81 MG tablet Take 81 mg by mouth daily.      Marland Kitchen glipiZIDE (GLUCOTROL XL) 10 MG 24 hr tablet Take 1 tablet (10 mg total) by mouth daily. 90 tablet 3  . GLIPIZIDE XL 10 MG 24 hr tablet TAKE ONE TABLET BY MOUTH ONCE DAILY 30 tablet 11  . glucose blood test strip Use as directed once daily  to check blood sugar.  Diagnosis code 250.00 100 each 11  . Lancets MISC Use as directed once daily to check blood sugar.  Diagnosis code 250.00 100 each 11  . lisinopril (PRINIVIL,ZESTRIL) 5 MG tablet TAKE ONE TABLET BY MOUTH ONCE DAILY 90 tablet 3  . meclizine (ANTIVERT) 12.5 MG tablet Take 1 tablet (12.5 mg total) by mouth 3 (three) times daily as needed. 40 tablet 1  . metFORMIN (GLUCOPHAGE) 1000 MG tablet TAKE ONE TABLET BY MOUTH TWICE A DAY WITH A MEAL 60 tablet 5  . neomycin-polymyxin-hydrocortisone (CORTISPORIN) otic solution Place 4 drops into the left ear 3 (three) times daily. 10 mL 0  . pioglitazone (ACTOS) 45 MG tablet Take 1 tablet (45 mg total) by mouth daily. 90 tablet 3  . pravastatin (PRAVACHOL) 40 MG tablet TAKE ONE TABLET BY MOUTH ONCE DAILY 90 tablet 3  . TRADJENTA 5 MG TABS tablet TAKE ONE TABLET BY MOUTH DAILY 30 tablet 9   No current facility-administered medications on file prior to visit.     BP 128/74   Pulse (!) 112   Resp 18   Wt 197 lb (89.4 kg)   SpO2 97%   BMI 36.03 kg/m       Objective:   Physical Exam  Constitutional: She is oriented to person, place, and time. She appears well-developed and well-nourished. No distress.  HENT:  Right Ear: Hearing, tympanic membrane, external ear and ear canal normal.  Left Ear: Hearing, tympanic membrane, external ear and ear canal normal.  Nose: Mucosal edema and rhinorrhea present. Right sinus exhibits maxillary sinus tenderness and frontal sinus tenderness. Left sinus exhibits maxillary sinus tenderness and frontal sinus tenderness.  Mouth/Throat: Uvula is midline, oropharynx is clear and moist and mucous membranes are normal.  Cardiovascular: Normal rate, regular rhythm, normal heart sounds and intact distal pulses. Exam reveals no gallop and no friction rub.  No murmur heard. Pulmonary/Chest: Effort normal and breath sounds normal. No respiratory distress. She has no wheezes. She has no rales. She exhibits no  tenderness.  Neurological: She is alert and oriented to person, place, and time.  Skin: Skin is warm and dry. No rash noted. She is not diaphoretic. No erythema. No pallor.  Psychiatric: She has a normal mood and affect. Her behavior is normal. Judgment and thought content normal.  Nursing note and vitals reviewed.     Assessment & Plan:  1. Acute non-recurrent pansinusitis - stop OTC medications -  they are making her tachycardic  - Stay hydrated and rest - Follow up with PCP if no improvement after finishing ABX  - doxycycline (VIBRAMYCIN) 100 MG capsule; Take 1 capsule (100 mg total) by mouth 2 (two) times daily.  Dispense: 14 capsule; Refill: 0  Dorothyann Peng, NP

## 2017-02-24 ENCOUNTER — Other Ambulatory Visit: Payer: Self-pay | Admitting: Internal Medicine

## 2017-02-27 ENCOUNTER — Encounter: Payer: Self-pay | Admitting: Family

## 2017-02-27 ENCOUNTER — Ambulatory Visit: Payer: No Typology Code available for payment source | Admitting: Family

## 2017-02-27 VITALS — BP 124/72 | HR 94 | Temp 98.5°F | Ht 62.0 in | Wt 197.0 lb

## 2017-02-27 DIAGNOSIS — J019 Acute sinusitis, unspecified: Secondary | ICD-10-CM

## 2017-02-27 DIAGNOSIS — J209 Acute bronchitis, unspecified: Secondary | ICD-10-CM | POA: Diagnosis not present

## 2017-02-27 MED ORDER — BENZONATATE 100 MG PO CAPS: 100.0000 mg | ORAL_CAPSULE | Freq: Three times a day (TID) | ORAL | 0 refills | Status: DC | PRN

## 2017-02-27 MED ORDER — LEVOFLOXACIN 500 MG PO TABS: 500.0000 mg | ORAL_TABLET | Freq: Every day | ORAL | 0 refills | Status: DC

## 2017-02-27 MED ORDER — PREDNISONE 20 MG PO TABS: 20.0000 mg | ORAL_TABLET | Freq: Every day | ORAL | 0 refills | Status: DC

## 2017-02-27 NOTE — Progress Notes (Signed)
Traci [name] is a 57 y.o. female with the following history as recorded in [facility]:  Patient Active Problem List   Diagnosis Date Noted  . Anxiety 07/29/2016  . Travel advice encounter 07/29/2016  . Eustachian tube dysfunction, right 05/01/2016  . Dizziness 11/19/2014  . Acute sinus infection 06/19/2012  . Abnormal liver function tests 07/25/2011  . Bunion, right 07/25/2011  . Vertigo 03/26/2011  . Vomiting 03/26/2011  . Preventative health care 07/30/2010  . CONTACT DERMATITIS 11/30/2009  . Allergic rhinitis 07/07/2008  . PLANTAR FASCIITIS, LEFT 08/05/2007  . Essential hypertension 07/08/2007  . Dizziness and giddiness 04/26/2007  . Hyperlipidemia 01/07/2007  . Diabetes ([place]) 11/29/2006    Current Outpatient Medications  Medication Sig Dispense Refill  . ALPRAZolam (XANAX) 1 MG tablet Take 1 tablet (1 mg total) by mouth daily as needed for anxiety. 20 tablet 0  . aspirin 81 MG tablet Take 81 mg by mouth daily.      Marland Kitchen GLIPIZIDE XL 10 MG 24 hr tablet TAKE ONE TABLET BY MOUTH ONCE DAILY 30 tablet 11  . glucose blood test strip Use as directed once daily to check blood sugar.  Diagnosis code 250.00 100 each 11  . Lancets MISC Use as directed once daily to check blood sugar.  Diagnosis code 250.00 100 each 11  . lisinopril (PRINIVIL,ZESTRIL) 5 MG tablet TAKE ONE TABLET BY MOUTH ONCE DAILY 90 tablet 3  . meclizine (ANTIVERT) 12.5 MG tablet Take 1 tablet (12.5 mg total) by mouth 3 (three) times daily as needed. 40 tablet 1  . metFORMIN (GLUCOPHAGE) 1000 MG tablet TAKE ONE TABLET BY MOUTH TWICE A DAY WITH A MEAL 60 tablet 4  . pioglitazone (ACTOS) 45 MG tablet Take 1 tablet (45 mg total) by mouth daily. 90 tablet 3  . pravastatin (PRAVACHOL) 40 MG tablet TAKE ONE TABLET BY MOUTH ONCE DAILY 90 tablet 3  . TRADJENTA 5 MG TABS tablet TAKE ONE TABLET BY MOUTH DAILY 30 tablet 9  . benzonatate (TESSALON) 100 MG capsule Take 1 capsule (100 mg total) by mouth 3 (three) times daily as needed  for cough. 20 capsule 0  . levofloxacin (LEVAQUIN) 500 MG tablet Take 1 tablet (500 mg total) by mouth daily. 10 tablet 0  . predniSONE (DELTASONE) 20 MG tablet Take 1 tablet (20 mg total) by mouth daily with breakfast. 5 tablet 0   No current facility-administered medications for this visit.     Allergies: Penicillins  Past Medical History:  Diagnosis Date  . ALLERGIC RHINITIS 07/07/2008  . CONTACT DERMATITIS 11/30/2009  . DIABETES MELLITUS, TYPE II 11/29/2006  . Dizziness and giddiness 04/26/2007  . HYPERLIPIDEMIA 01/07/2007  . HYPERTENSION 07/08/2007  . PLANTAR FASCIITIS, LEFT 08/05/2007  . Preventative health care 07/30/2010    Past Surgical History:  Procedure Laterality Date  . MOUTH SURGERY     teeth extraction  . TUBAL LIGATION      Family History  Problem Relation Age of Onset  . Stroke Mother   . Cancer Mother        lung and breast cancer  . Depression Mother   . Heart disease Mother        Attack, Aneurysm  . Hypertension Mother   . Varicose Veins Mother   . Raynaud syndrome Mother   . Hyperlipidemia Mother   . Glaucoma Mother   . Cancer Father        prostate cancer  . Diabetes Father   . Stroke Father  x3  . Hyperlipidemia Father   . Hypertension Father   . Heart disease Father        bypass surgery  . Thrombophilia Father   . Bipolar disorder Maternal Aunt   . Cancer Maternal Aunt        Ovarian  . Fibroids Maternal Aunt   . Diabetes Maternal Aunt   . Hypertension Maternal Uncle   . Diabetes Maternal Uncle   . Diabetes Paternal Aunt   . Hyperlipidemia Paternal Aunt   . Hypertension Paternal Aunt   . Fibroids Paternal Aunt   . Thyroid disease Paternal Aunt   . Diabetes Paternal Uncle   . Stroke Paternal Uncle   . Thrombophilia Paternal Uncle   . Colon cancer Neg Hx   . Colon polyps Neg Hx     Social History   Tobacco Use  . Smoking status: Never Smoker  . Smokeless tobacco: Never Used  Substance Use Topics  . Alcohol use: Yes     Alcohol/week: 0.0 oz    Comment: 1-2 every 2-3 weeks    Subjective:  Seen on 02/02/2017 with sinus infection; started on Doxycycline and took medication for 7 days; complaining of persisting cough and nasal drainage; "just can't shake it." Was told previously to stop OTC medications as causing her heart rate to be up; has re-started Mucinex, Robitussin and Advil cold and sinus in the past 2 days; notes that cough has been very problematic at night; is diabetic- does not check her blood sugar regularly; scheduled next week for diabetes follow-up with her PCP;    Objective:  Vitals:   02/27/17 1503  BP: 124/72  Pulse: 94  Temp: 98.5 F (36.9 C)  TempSrc: Oral  SpO2: 100%  Weight: 197 lb 0.6 oz (89.4 kg)  Height: 5\' 2"  (1.575 m)    General: Well developed, well nourished, in no acute distress  Skin : Warm and dry.  Head: Normocephalic and atraumatic  Eyes: Sclera and conjunctiva clear; pupils round and reactive to light; extraocular movements intact  Ears: External normal; canals clear; tympanic membranes full/ erythematous Oropharynx: Pink, supple. No suspicious lesions  Neck: Supple without thyromegaly, adenopathy  Lungs: Respirations unlabored; clear to auscultation bilaterally without wheeze, rales, rhonchi  CVS exam: normal rate and regular rhythm.  Neurologic: Alert and oriented; speech intact; face symmetrical; moves all extremities well; CNII-XII intact without focal deficit  Assessment:  1. Acute sinusitis, recurrence not specified, unspecified location   2. Acute bronchitis, unspecified organism     Plan:  Rx for Levaquin 500 mg qd x 10 days; Rx for Prednisone 20 mg qd x 5 days; Rx for Tessalon perles; increase fluids, rest; work note for Thursday/ Friday; follow-up worse, no better; keep planned follow-up with PCP for next week- if still symptomatic at that time, would need CXR.   No Follow-up on file.  No orders of the defined types were placed in this encounter.    Requested Prescriptions   Signed Prescriptions Disp Refills  . levofloxacin (LEVAQUIN) 500 MG tablet 10 tablet 0    Sig: Take 1 tablet (500 mg total) by mouth daily.  . predniSONE (DELTASONE) 20 MG tablet 5 tablet 0    Sig: Take 1 tablet (20 mg total) by mouth daily with breakfast.  . benzonatate (TESSALON) 100 MG capsule 20 capsule 0    Sig: Take 1 capsule (100 mg total) by mouth 3 (three) times daily as needed for cough.

## 2017-03-01 ENCOUNTER — Other Ambulatory Visit (INDEPENDENT_AMBULATORY_CARE_PROVIDER_SITE_OTHER): Payer: No Typology Code available for payment source

## 2017-03-01 DIAGNOSIS — E119 Type 2 diabetes mellitus without complications: Secondary | ICD-10-CM | POA: Diagnosis not present

## 2017-03-01 DIAGNOSIS — Z114 Encounter for screening for human immunodeficiency virus [HIV]: Secondary | ICD-10-CM

## 2017-03-01 LAB — HEPATIC FUNCTION PANEL
ALK PHOS: 62 U/L (ref 39–117)
ALT: 15 U/L (ref 0–35)
AST: 15 U/L (ref 0–37)
Albumin: 4 g/dL (ref 3.5–5.2)
BILIRUBIN DIRECT: 0.1 mg/dL (ref 0.0–0.3)
BILIRUBIN TOTAL: 0.3 mg/dL (ref 0.2–1.2)
TOTAL PROTEIN: 7 g/dL (ref 6.0–8.3)

## 2017-03-01 LAB — BASIC METABOLIC PANEL
BUN: 15 mg/dL (ref 6–23)
CHLORIDE: 105 meq/L (ref 96–112)
CO2: 26 mEq/L (ref 19–32)
CREATININE: 0.77 mg/dL (ref 0.40–1.20)
Calcium: 9.3 mg/dL (ref 8.4–10.5)
GFR: 82.46 mL/min (ref >60.00)
GLUCOSE: 170 mg/dL — AB (ref 70–99)
POTASSIUM: 3.8 meq/L (ref 3.5–5.1)
Sodium: 141 mEq/L (ref 135–145)

## 2017-03-01 LAB — HEMOGLOBIN A1C: HEMOGLOBIN A1C: 6.2 % (ref 4.6–6.5)

## 2017-03-01 LAB — LIPID PANEL
Cholesterol: 131 mg/dL (ref 0–200)
HDL: 50.5 mg/dL (ref >39.00)
LDL CALC: 69 mg/dL (ref 0–99)
NONHDL: 80.55
Total CHOL/HDL Ratio: 3
Triglycerides: 60 mg/dL (ref 0.0–149.0)
VLDL: 12 mg/dL (ref 0.0–40.0)

## 2017-03-02 LAB — HIV ANTIBODY (ROUTINE TESTING W REFLEX): HIV 1&2 Ab, 4th Generation: NONREACTIVE

## 2017-03-07 ENCOUNTER — Encounter: Payer: Self-pay | Admitting: Internal Medicine

## 2017-03-07 ENCOUNTER — Ambulatory Visit: Payer: No Typology Code available for payment source | Admitting: Internal Medicine

## 2017-03-07 VITALS — BP 122/78 | HR 103 | Temp 98.4°F | Ht 62.0 in | Wt 198.0 lb

## 2017-03-07 DIAGNOSIS — Z23 Encounter for immunization: Secondary | ICD-10-CM | POA: Diagnosis not present

## 2017-03-07 DIAGNOSIS — E785 Hyperlipidemia, unspecified: Secondary | ICD-10-CM | POA: Diagnosis not present

## 2017-03-07 DIAGNOSIS — I10 Essential (primary) hypertension: Secondary | ICD-10-CM

## 2017-03-07 DIAGNOSIS — E119 Type 2 diabetes mellitus without complications: Secondary | ICD-10-CM | POA: Diagnosis not present

## 2017-03-07 MED ORDER — GLIPIZIDE ER 5 MG PO TB24: 5.0000 mg | ORAL_TABLET | Freq: Every day | ORAL | 3 refills | Status: DC

## 2017-03-07 NOTE — Patient Instructions (Addendum)
You had the flu shot today  OK to decrease the glpizide ER to 5 mg per day  Please continue all other medications as before, and refills have been done if requested.  Please have the pharmacy call with any other refills you may need.  Please continue your efforts at being more active, low cholesterol diabetic diet, and weight control.  Please keep your appointments with your specialists as you may have planned  Please return in 6 months, or sooner if needed, with Lab testing done 3-5 days before

## 2017-03-07 NOTE — Progress Notes (Signed)
Subjective:    Patient ID: Traci [name], female    DOB: April 18, 1960, 57 y.o.   MRN: [id number]  HPI  Here to f/u; overall doing ok,  Pt denies chest pain, increasing sob or doe, wheezing, orthopnea, PND, increased LE swelling, palpitations, dizziness or syncope.  Pt denies new neurological symptoms such as new headache, or facial or extremity weakness or numbness.  Pt denies polydipsia, polyuria, or low sugar episode.  Pt states overall good compliance with meds, mostly trying to follow appropriate diet, with wt overall stable,  but little exercise however. Wt Readings from Last 3 Encounters:  03/07/17 198 lb (89.8 kg)  02/27/17 197 lb 0.6 oz (89.4 kg)  02/02/17 197 lb (89.4 kg)  Recent URI symptoms now resolved  NO new complaints Past Medical History:  Diagnosis Date  . ALLERGIC RHINITIS 07/07/2008  . CONTACT DERMATITIS 11/30/2009  . DIABETES MELLITUS, TYPE II 11/29/2006  . Dizziness and giddiness 04/26/2007  . HYPERLIPIDEMIA 01/07/2007  . HYPERTENSION 07/08/2007  . PLANTAR FASCIITIS, LEFT 08/05/2007  . Preventative health care 07/30/2010   Past Surgical History:  Procedure Laterality Date  . MOUTH SURGERY     teeth extraction  . TUBAL LIGATION      reports that  has never smoked. she has never used smokeless tobacco. She reports that she drinks alcohol. She reports that she does not use drugs. family history includes Bipolar disorder in her maternal aunt; Cancer in her father, maternal aunt, and mother; Depression in her mother; Diabetes in her father, maternal aunt, maternal uncle, paternal aunt, and paternal uncle; Fibroids in her maternal aunt and paternal aunt; Glaucoma in her mother; Heart disease in her father and mother; Hyperlipidemia in her father, mother, and paternal aunt; Hypertension in her father, maternal uncle, mother, and paternal aunt; Raynaud syndrome in her mother; Stroke in her father, mother, and paternal uncle; Thrombophilia in her father and paternal uncle; Thyroid  disease in her paternal aunt; Varicose Veins in her mother. Allergies  Allergen Reactions  . Penicillins     REACTION: Rash   Current Outpatient Medications on File Prior to Visit  Medication Sig Dispense Refill  . ALPRAZolam (XANAX) 1 MG tablet Take 1 tablet (1 mg total) by mouth daily as needed for anxiety. 20 tablet 0  . aspirin 81 MG tablet Take 81 mg by mouth daily.      Marland Kitchen glucose blood test strip Use as directed once daily to check blood sugar.  Diagnosis code 250.00 100 each 11  . Lancets MISC Use as directed once daily to check blood sugar.  Diagnosis code 250.00 100 each 11  . lisinopril (PRINIVIL,ZESTRIL) 5 MG tablet TAKE ONE TABLET BY MOUTH ONCE DAILY 90 tablet 3  . meclizine (ANTIVERT) 12.5 MG tablet Take 1 tablet (12.5 mg total) by mouth 3 (three) times daily as needed. 40 tablet 1  . metFORMIN (GLUCOPHAGE) 1000 MG tablet TAKE ONE TABLET BY MOUTH TWICE A DAY WITH A MEAL 60 tablet 4  . pioglitazone (ACTOS) 45 MG tablet Take 1 tablet (45 mg total) by mouth daily. 90 tablet 3  . pravastatin (PRAVACHOL) 40 MG tablet TAKE ONE TABLET BY MOUTH ONCE DAILY 90 tablet 3  . TRADJENTA 5 MG TABS tablet TAKE ONE TABLET BY MOUTH DAILY 30 tablet 9   No current facility-administered medications on file prior to visit.    Review of Systems  Constitutional: Negative for other unusual diaphoresis or sweats HENT: Negative for ear discharge or swelling Eyes: Negative for  other worsening visual disturbances Respiratory: Negative for stridor or other swelling  Gastrointestinal: Negative for worsening distension or other blood Genitourinary: Negative for retention or other urinary change Musculoskeletal: Negative for other MSK pain or swelling Skin: Negative for color change or other new lesions Neurological: Negative for worsening tremors and other numbness  Psychiatric/Behavioral: Negative for worsening agitation or other fatigue All other system neg per pt    Objective:   Physical Exam BP  122/78   Pulse (!) 103   Temp 98.4 F (36.9 C) (Oral)   Ht 5\' 2"  (1.575 m)   Wt 198 lb (89.8 kg)   SpO2 97%   BMI 36.21 kg/m  VS noted, not ill appering Constitutional: Pt appears in NAD HENT: Head: NCAT.  Right Ear: External ear normal.  Left Ear: External ear normal.  Eyes: . Pupils are equal, round, and reactive to light. Conjunctivae and EOM are normal Nose: without d/c or deformity Neck: Neck supple. Gross normal ROM Cardiovascular: Normal rate and regular rhythm.   Pulmonary/Chest: Effort normal and breath sounds without rales or wheezing.  Abd:  Soft, NT, ND, + BS, no organomegaly Neurological: Pt is alert. At baseline orientation, motor grossly intact Skin: Skin is warm. No rashes, other new lesions, no LE edema Psychiatric: Pt behavior is normal without agitation  No other exam findings    Assessment & Plan:

## 2017-03-09 NOTE — Assessment & Plan Note (Signed)
stable overall by history and exam, recent data reviewed with pt, and pt to continue medical treatment as before,  to f/u any worsening symptoms or concerns BP Readings from Last 3 Encounters:  03/07/17 122/78  02/27/17 124/72  02/02/17 128/74

## 2017-03-09 NOTE — Assessment & Plan Note (Signed)
stable overall by history and exam, recent data reviewed with pt, and pt to continue medical treatment as before,  to f/u any worsening symptoms or concerns Lab Results  Component Value Date   HGBA1C 6.2 03/01/2017

## 2017-03-09 NOTE — Assessment & Plan Note (Signed)
Lab Results  Component Value Date   HGBA1C 6.2 03/01/2017  somewhat overcontrolled, ok to reduce the glipizide ER to 5 mg daily

## 2017-04-29 ENCOUNTER — Other Ambulatory Visit: Payer: Self-pay | Admitting: Internal Medicine

## 2017-05-27 ENCOUNTER — Ambulatory Visit: Payer: No Typology Code available for payment source | Admitting: Internal Medicine

## 2017-05-27 ENCOUNTER — Encounter: Payer: Self-pay | Admitting: Internal Medicine

## 2017-05-27 VITALS — BP 126/78 | HR 106 | Temp 98.6°F | Ht 62.0 in | Wt 203.0 lb

## 2017-05-27 DIAGNOSIS — M25561 Pain in right knee: Secondary | ICD-10-CM | POA: Diagnosis not present

## 2017-05-27 DIAGNOSIS — S4991XA Unspecified injury of right shoulder and upper arm, initial encounter: Secondary | ICD-10-CM | POA: Insufficient documentation

## 2017-05-27 DIAGNOSIS — M7670 Peroneal tendinitis, unspecified leg: Secondary | ICD-10-CM | POA: Insufficient documentation

## 2017-05-27 DIAGNOSIS — M25562 Pain in left knee: Secondary | ICD-10-CM | POA: Diagnosis not present

## 2017-05-27 DIAGNOSIS — G8929 Other chronic pain: Secondary | ICD-10-CM

## 2017-05-27 DIAGNOSIS — S4991XD Unspecified injury of right shoulder and upper arm, subsequent encounter: Secondary | ICD-10-CM

## 2017-05-27 DIAGNOSIS — M25571 Pain in right ankle and joints of right foot: Secondary | ICD-10-CM

## 2017-05-27 DIAGNOSIS — E119 Type 2 diabetes mellitus without complications: Secondary | ICD-10-CM | POA: Diagnosis not present

## 2017-05-27 LAB — POCT GLYCOSYLATED HEMOGLOBIN (HGB A1C): HEMOGLOBIN A1C: 6 % — AB (ref 4.0–5.6)

## 2017-05-27 NOTE — Assessment & Plan Note (Signed)
Etiology unclear, but cant r/o tendonopathy, also for sport med referral

## 2017-05-27 NOTE — Assessment & Plan Note (Signed)
C/w prob OA, for sport med referral

## 2017-05-27 NOTE — Assessment & Plan Note (Signed)
stable overall by history and exam, recent data reviewed with pt, and pt to continue medical treatment as before,  to f/u any worsening symptoms or concerns Lab Results  Component Value Date   HGBA1C 6.0 (A) 05/27/2017

## 2017-05-27 NOTE — Progress Notes (Signed)
Subjective:    Patient ID: Traci [name], female    DOB: 11/09/1960, 57 y.o.   MRN: [id number]  HPI    Here with c/o bilat knee pain, right ankle pain and right shoulder pain, worsening x 3-4 wks, without trauma, fever, rash.  Left knee seemed to start with small intermittent effusion with exercise, then right knee seemed to do the same, then right ankle starting having pain to the lateral aspect with tenderness.  Also right shoulder has been aching for 2 wks, mild, dull, worse to abduct.  All pain some better with alleve and tylenol prn  Pt denies chest pain, increased sob or doe, wheezing, orthopnea, PND, palpitations, dizziness or syncope, though has some trace LE edema after increased actos.  Pt denies polydipsia, polyuria Past Medical History:  Diagnosis Date  . ALLERGIC RHINITIS 07/07/2008  . CONTACT DERMATITIS 11/30/2009  . DIABETES MELLITUS, TYPE II 11/29/2006  . Dizziness and giddiness 04/26/2007  . HYPERLIPIDEMIA 01/07/2007  . HYPERTENSION 07/08/2007  . PLANTAR FASCIITIS, LEFT 08/05/2007  . Preventative health care 07/30/2010   Past Surgical History:  Procedure Laterality Date  . MOUTH SURGERY     teeth extraction  . TUBAL LIGATION      reports that she has never smoked. She has never used smokeless tobacco. She reports that she drinks alcohol. She reports that she does not use drugs. family history includes Bipolar disorder in her maternal aunt; Cancer in her father, maternal aunt, and mother; Depression in her mother; Diabetes in her father, maternal aunt, maternal uncle, paternal aunt, and paternal uncle; Fibroids in her maternal aunt and paternal aunt; Glaucoma in her mother; Heart disease in her father and mother; Hyperlipidemia in her father, mother, and paternal aunt; Hypertension in her father, maternal uncle, mother, and paternal aunt; Raynaud syndrome in her mother; Stroke in her father, mother, and paternal uncle; Thrombophilia in her father and paternal uncle; Thyroid  disease in her paternal aunt; Varicose Veins in her mother. Allergies  Allergen Reactions  . Penicillins     REACTION: Rash   Current Outpatient Medications on File Prior to Visit  Medication Sig Dispense Refill  . ALPRAZolam (XANAX) 1 MG tablet Take 1 tablet (1 mg total) by mouth daily as needed for anxiety. 20 tablet 0  . aspirin 81 MG tablet Take 81 mg by mouth daily.      Marland Kitchen glipiZIDE (GLUCOTROL XL) 5 MG 24 hr tablet Take 1 tablet (5 mg total) by mouth daily with breakfast. 90 tablet 3  . glucose blood test strip Use as directed once daily to check blood sugar.  Diagnosis code 250.00 100 each 11  . Lancets MISC Use as directed once daily to check blood sugar.  Diagnosis code 250.00 100 each 11  . lisinopril (PRINIVIL,ZESTRIL) 5 MG tablet TAKE 1 TABLET BY MOUTH ONCE DAILY 90 tablet 3  . meclizine (ANTIVERT) 12.5 MG tablet Take 1 tablet (12.5 mg total) by mouth 3 (three) times daily as needed. 40 tablet 1  . metFORMIN (GLUCOPHAGE) 1000 MG tablet TAKE ONE TABLET BY MOUTH TWICE A DAY WITH A MEAL 60 tablet 4  . pioglitazone (ACTOS) 45 MG tablet Take 1 tablet (45 mg total) by mouth daily. 90 tablet 3  . pravastatin (PRAVACHOL) 40 MG tablet TAKE ONE TABLET BY MOUTH ONCE DAILY 90 tablet 3  . TRADJENTA 5 MG TABS tablet TAKE ONE TABLET BY MOUTH DAILY 30 tablet 9   No current facility-administered medications on file prior to visit.  Review of Systems  Constitutional: Negative for other unusual diaphoresis or sweats HENT: Negative for ear discharge or swelling Eyes: Negative for other worsening visual disturbances Respiratory: Negative for stridor or other swelling  Gastrointestinal: Negative for worsening distension or other blood Genitourinary: Negative for retention or other urinary change Musculoskeletal: Negative for other MSK pain or swelling Skin: Negative for color change or other new lesions Neurological: Negative for worsening tremors and other numbness  Psychiatric/Behavioral:  Negative for worsening agitation or other fatigue All other system neg per pt    Objective:   Physical Exam BP 126/78   Pulse (!) 106   Temp 98.6 F (37 C) (Oral)   Ht 5\' 2"  (1.575 m)   Wt 203 lb (92.1 kg)   SpO2 97%   BMI 37.13 kg/m  VS noted,  Constitutional: Pt appears in NAD HENT: Head: NCAT.  Right Ear: External ear normal.  Left Ear: External ear normal.  Eyes: . Pupils are equal, round, and reactive to light. Conjunctivae and EOM are normal Nose: without d/c or deformity Neck: Neck supple. Gross normal ROM Cardiovascular: Normal rate and regular rhythm.   Pulmonary/Chest: Effort normal and breath sounds without rales or wheezing.  Bilat knees with crepitus, trace effusion, but FROM Right ankle with swelling tender to tarsal area only with ? Ganglion Right shoulder NT but decreased ROM on abduction by 20 degrees Neurological: Pt is alert. At baseline orientation, motor grossly intact Skin: Skin is warm. No rashes, other new lesions, trace LE edema to knees Psychiatric: Pt behavior is normal without agitation  No other exam findings Lab Results  Component Value Date   WBC 6.5 08/24/2016   HGB 13.5 08/24/2016   HCT 40.8 08/24/2016   PLT 237.0 08/24/2016   GLUCOSE 170 (H) 03/01/2017   CHOL 131 03/01/2017   TRIG 60.0 03/01/2017   HDL 50.50 03/01/2017   LDLCALC 69 03/01/2017   ALT 15 03/01/2017   AST 15 03/01/2017   NA 141 03/01/2017   K 3.8 03/01/2017   CL 105 03/01/2017   CREATININE 0.77 03/01/2017   BUN 15 03/01/2017   CO2 26 03/01/2017   TSH 2.86 08/24/2016   HGBA1C 6.0 (A) 05/27/2017   MICROALBUR <0.7 08/24/2016      Assessment & Plan:

## 2017-05-27 NOTE — Assessment & Plan Note (Signed)
C/w ? Ganglion cyst, for sport med referral

## 2017-05-27 NOTE — Patient Instructions (Signed)
Your a1c was OK today  Please make an appt with Sports Medicine as you leave today  Please continue all other medications as before, and refills have been done if requested.  Please have the pharmacy call with any other refills you may need.  Please continue your efforts at being more active, low cholesterol diet, and weight control.  Please keep your appointments with your specialists as you may have planned

## 2017-05-31 ENCOUNTER — Ambulatory Visit: Payer: No Typology Code available for payment source | Admitting: Family Medicine

## 2017-05-31 ENCOUNTER — Ambulatory Visit: Payer: Self-pay

## 2017-05-31 ENCOUNTER — Encounter: Payer: Self-pay | Admitting: Family Medicine

## 2017-05-31 VITALS — BP 142/78 | HR 92 | Temp 98.6°F | Ht 62.0 in | Wt 201.0 lb

## 2017-05-31 DIAGNOSIS — M25561 Pain in right knee: Secondary | ICD-10-CM | POA: Diagnosis not present

## 2017-05-31 DIAGNOSIS — G8929 Other chronic pain: Secondary | ICD-10-CM

## 2017-05-31 DIAGNOSIS — M25571 Pain in right ankle and joints of right foot: Secondary | ICD-10-CM

## 2017-05-31 DIAGNOSIS — M7671 Peroneal tendinitis, right leg: Secondary | ICD-10-CM | POA: Diagnosis not present

## 2017-05-31 DIAGNOSIS — M25562 Pain in left knee: Secondary | ICD-10-CM | POA: Diagnosis not present

## 2017-05-31 MED ORDER — MELOXICAM 15 MG PO TABS: 15.0000 mg | ORAL_TABLET | Freq: Every day | ORAL | 0 refills | Status: DC

## 2017-05-31 MED ORDER — DICLOFENAC SODIUM 2 % TD SOLN: 1.0000 "application " | Freq: Two times a day (BID) | TRANSDERMAL | 3 refills | Status: DC

## 2017-05-31 NOTE — Assessment & Plan Note (Signed)
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 No significant arthritic changes observed on [place]  - counseled on HEP  - pennsaid  - if no improvement consider PT

## 2017-05-31 NOTE — Progress Notes (Signed)
[name] - 57 y.o. female MRN [id number]  Date of birth: [birth year]  SUBJECTIVE:  Including CC & ROS.  Chief Complaint  Patient presents with  . Knee and ankle pain    Traci [name] is a 57 y.o. female that is presenting with bilateral knee pain.  Ongoing for six months. Located on the lateral aspect of the patella on both of her knees. Pain is worse with extension. Pain is constant ache. Denies injuries or surgeries. She has been taking Advil for the pain. Denies swelling. Pain is worse when getting up from a seated position. No swelling. Has tried ibuprofen.   Right ankle pain is located on the lateral malleolus. Denies injury. Admits to swelling. Pain is worse with extension. Denies surgery. Pain is worse with walking. Denies any inciting event. No improvement since it started. No bruising.    Review of Systems  Constitutional: Negative for fever.  HENT: Negative for congestion.   Respiratory: Negative for cough.   Cardiovascular: Negative for chest pain.  Gastrointestinal: Negative for abdominal pain.  Musculoskeletal: Negative for gait problem.  Skin: Negative for color change.  Neurological: Negative for weakness.  Hematological: Negative for adenopathy.  Psychiatric/Behavioral: Negative for agitation.    HISTORY: Past Medical, Surgical, Social, and Family History Reviewed & Updated per EMR.   Pertinent Historical Findings include:  Past Medical History:  Diagnosis Date  . ALLERGIC RHINITIS 07/07/2008  . CONTACT DERMATITIS 11/30/2009  . DIABETES MELLITUS, TYPE II 11/29/2006  . Dizziness and giddiness 04/26/2007  . HYPERLIPIDEMIA 01/07/2007  . HYPERTENSION 07/08/2007  . PLANTAR FASCIITIS, LEFT 08/05/2007  . Preventative health care 07/30/2010    Past Surgical History:  Procedure Laterality Date  . MOUTH SURGERY     teeth extraction  . TUBAL LIGATION      Allergies  Allergen Reactions  . Penicillins     REACTION: Rash    Family History  Problem Relation  Age of Onset  . Stroke Mother   . Cancer Mother        lung and breast cancer  . Depression Mother   . Heart disease Mother        Attack, Aneurysm  . Hypertension Mother   . Varicose Veins Mother   . Raynaud syndrome Mother   . Hyperlipidemia Mother   . Glaucoma Mother   . Cancer Father        prostate cancer  . Diabetes Father   . Stroke Father        x3  . Hyperlipidemia Father   . Hypertension Father   . Heart disease Father        bypass surgery  . Thrombophilia Father   . Bipolar disorder Maternal Aunt   . Cancer Maternal Aunt        Ovarian  . Fibroids Maternal Aunt   . Diabetes Maternal Aunt   . Hypertension Maternal Uncle   . Diabetes Maternal Uncle   . Diabetes Paternal Aunt   . Hyperlipidemia Paternal Aunt   . Hypertension Paternal Aunt   . Fibroids Paternal Aunt   . Thyroid disease Paternal Aunt   . Diabetes Paternal Uncle   . Stroke Paternal Uncle   . Thrombophilia Paternal Uncle   . Colon cancer Neg Hx   . Colon polyps Neg Hx      Social History   Socioeconomic History  . Marital status: Married    Spouse name: Not on file  . Number of children: 2  . Years of  education: Not on file  . Highest education level: Not on file  Occupational History  . 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 Financial resource strain: Not on file  . Food insecurity:    Worry: Not on file    Inability: Not on file  . Transportation needs:    Medical: Not on file    Non-medical: Not on file  Tobacco Use  . Smoking status: Never Smoker  . Smokeless tobacco: Never Used  Substance and Sexual Activity  . Alcohol use: Yes    Alcohol/week: 0.0 oz    Comment: 1-2 every 2-3 weeks  . Drug use: No  . Sexual activity: Not on file  Lifestyle  . Physical activity:    Days per week: Not on file    Minutes per session: Not on file  . Stress: Not on file  Relationships  . Social connections:    Talks on phone: Not on file    Gets  together: Not on file    Attends religious service: Not on file    Active member of club or organization: Not on file    Attends meetings of clubs or organizations: Not on file    Relationship status: Not on file  . Intimate partner violence:    Fear of current or ex partner: Not on file    Emotionally abused: Not on file    Physically abused: Not on file    Forced sexual activity: Not on file  Other Topics Concern  . Not on file  Social History Narrative  . Not on file     PHYSICAL EXAM:  VS: BP (!) 142/78 (BP Location: Left Arm, Patient Position: Sitting, Cuff Size: Large)   Pulse 92   Temp 98.6 F (37 C) (Oral)   Ht 5\' 2"  (1.575 m)   Wt 201 lb (91.2 kg)   SpO2 97%   BMI 36.76 kg/m  Physical Exam Gen: NAD, alert, cooperative with exam, well-appearing ENT: normal lips, normal nasal mucosa,  Eye: normal EOM, normal conjunctiva and lids CV:  no edema, +2 pedal pulses   Resp: no accessory muscle use, non-labored,  Skin: no rashes, no areas of induration  Neuro: normal tone, normal sensation to touch Psych:  normal insight, alert and oriented MSK:  Right and left Knee: Normal to inspection with no erythema or effusion or obvious bony abnormalities. Palpation normal with no warmth, joint line tenderness or condyle tenderness. Patellar tenderness on the lateral aspect  ROM full in flexion and extension and lower leg rotation. Ligaments with solid consistent endpoints including LCL, MCL. Negative Mcmurray's tests. Non painful patellar compression. Patellar glide without crepitus. Patellar and quadriceps tendons unremarkable. Hamstring and quadriceps strength is normal.  Right ankle:  Swelling inferior to the lateral malleolus  Normal ROM  Weakness to resistance with eversion  No TTP at base of 5th MT Normal gait  Neurovascularly intact   Limited ultrasound: left and right knee/right ankle:  Right knee:  No effusion in SPP   Left knee:  No effusion in SPP    Right ankle:  Peroneal tendon with surrounding effusion  Normal insertion into base of the 5th MT   No hypoechoic changes in the   Summary: normal appearing knee exams. Peroneal tendonitis. 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 No significant arthritic changes observed on [place]  - counseled on HEP  - pennsaid  - if no improvement consider PT   Peroneal tendinitis Tenosynovitis of the peroneal tendons. Appears to be acute in nature.  - mobic  - pennsaid - f/u in three weeks. Would start rehab exercises at that time. If no improvement could consider post op shoe and nitro.  - counseled on compression.

## 2017-05-31 NOTE — Assessment & Plan Note (Signed)
Tenosynovitis of the peroneal tendons. Appears to be acute in nature.  - mobic  - pennsaid - f/u in three weeks. Would start rehab exercises at that time. If no improvement could consider post op shoe and nitro.  - counseled on compression.

## 2017-05-31 NOTE — Patient Instructions (Addendum)
Nice to meet you  Please try the medication for 7 days straight  Please try compression on the ankle  Please try the exercises for your knees.  Please follow up in 3 weeks.

## 2017-06-05 ENCOUNTER — Other Ambulatory Visit: Payer: Self-pay | Admitting: Internal Medicine

## 2017-06-21 ENCOUNTER — Ambulatory Visit: Payer: No Typology Code available for payment source | Admitting: Family Medicine

## 2017-06-21 ENCOUNTER — Encounter: Payer: Self-pay | Admitting: Family Medicine

## 2017-06-21 DIAGNOSIS — M25562 Pain in left knee: Secondary | ICD-10-CM | POA: Diagnosis not present

## 2017-06-21 DIAGNOSIS — M7671 Peroneal tendinitis, right leg: Secondary | ICD-10-CM

## 2017-06-21 DIAGNOSIS — G8929 Other chronic pain: Secondary | ICD-10-CM | POA: Diagnosis not present

## 2017-06-21 DIAGNOSIS — M25561 Pain in right knee: Secondary | ICD-10-CM | POA: Diagnosis not present

## 2017-06-21 NOTE — Progress Notes (Signed)
Traci [name] - 57 y.o. female MRN [id number]  Date of birth: [birth year]  SUBJECTIVE:  Including CC & ROS.  Chief Complaint  Patient presents with  . Follow-up    Traci [name] is a 57 y.o. female that is here today for right ankle and knee pain follow up. Pain in her ankle and knees have improved since her last visit. She has been using Mobic daily and applying Pennsaid. She has been completing daily exercises provided at the last visit. Denies swelling. Her knee pain is improved. She works at a desk job. She has a trip planned at the end of the month for [place].    Review of Systems  Constitutional: Negative for fever.  Cardiovascular: Negative for chest pain.  Gastrointestinal: Negative for abdominal pain.  Musculoskeletal: Negative for gait problem.  Skin: Negative for color change.  Hematological: Negative for adenopathy.    HISTORY: Past Medical, Surgical, Social, and Family History Reviewed & Updated per EMR.   Pertinent Historical Findings include:  Past Medical History:  Diagnosis Date  . ALLERGIC RHINITIS 07/07/2008  . CONTACT DERMATITIS 11/30/2009  . DIABETES MELLITUS, TYPE II 11/29/2006  . Dizziness and giddiness 04/26/2007  . HYPERLIPIDEMIA 01/07/2007  . HYPERTENSION 07/08/2007  . PLANTAR FASCIITIS, LEFT 08/05/2007  . Preventative health care 07/30/2010    Past Surgical History:  Procedure Laterality Date  . MOUTH SURGERY     teeth extraction  . TUBAL LIGATION      Allergies  Allergen Reactions  . Penicillins     REACTION: Rash    Family History  Problem Relation Age of Onset  . Stroke Mother   . Cancer Mother        lung and breast cancer  . Depression Mother   . Heart disease Mother        Attack, Aneurysm  . Hypertension Mother   . Varicose Veins Mother   . Raynaud syndrome Mother   . Hyperlipidemia Mother   . Glaucoma Mother   . Cancer Father        prostate cancer  . Diabetes Father   . Stroke Father        x3  . Hyperlipidemia Father    . Hypertension Father   . Heart disease Father        bypass surgery  . Thrombophilia Father   . Bipolar disorder Maternal Aunt   . Cancer Maternal Aunt        Ovarian  . Fibroids Maternal Aunt   . Diabetes Maternal Aunt   . Hypertension Maternal Uncle   . Diabetes Maternal Uncle   . Diabetes Paternal Aunt   . Hyperlipidemia Paternal Aunt   . Hypertension Paternal Aunt   . Fibroids Paternal Aunt   . Thyroid disease Paternal Aunt   . Diabetes Paternal Uncle   . Stroke Paternal Uncle   . Thrombophilia Paternal Uncle   . Colon cancer Neg Hx   . Colon polyps Neg Hx      Social History   Socioeconomic History  . Marital status: Married    Spouse name: Not on file  . Number of children: 2  . Years of education: Not on file  . Highest education level: Not on file  Occupational History  . 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 Financial resource strain: Not on file  . Food insecurity:    Worry: Not on file    Inability: Not on file  .  Transportation needs:    Medical: Not on file    Non-medical: Not on file  Tobacco Use  . Smoking status: Never Smoker  . Smokeless tobacco: Never Used  Substance and Sexual Activity  . Alcohol use: Yes    Alcohol/week: 0.0 oz    Comment: 1-2 every 2-3 weeks  . Drug use: No  . Sexual activity: Not on file  Lifestyle  . Physical activity:    Days per week: Not on file    Minutes per session: Not on file  . Stress: Not on file  Relationships  . Social connections:    Talks on phone: Not on file    Gets together: Not on file    Attends religious service: Not on file    Active member of club or organization: Not on file    Attends meetings of clubs or organizations: Not on file    Relationship status: Not on file  . Intimate partner violence:    Fear of current or ex partner: Not on file    Emotionally abused: Not on file    Physically abused: Not on file    Forced sexual activity: Not on file    Other Topics Concern  . Not on file  Social History Narrative  . Not on file     PHYSICAL EXAM:  VS: BP 118/66 (BP Location: Right Arm, Patient Position: Sitting, Cuff Size: Large)   Pulse 68   Ht 5\' 2"  (1.575 m)   Wt 200 lb (90.7 kg)   SpO2 98%   BMI 36.58 kg/m  Physical Exam Gen: NAD, alert, cooperative with exam, well-appearing ENT: normal lips, normal nasal mucosa,  Eye: normal EOM, normal conjunctiva and lids CV:  no edema, +2 pedal pulses   Resp: no accessory muscle use, non-labored,  Skin: no rashes, no areas of induration  Neuro: normal tone, normal sensation to touch Psych:  normal insight, alert and oriented MSK:  Right ankle:  Normal AROM  Mild swelling at the lateral malleolus  Normal strength to resistance  Some pain with testing in eversion  No TTP at base of 5th MT Neurovascularly intact       ASSESSMENT & PLAN:   Bilateral knee pain Has had improvement of her pain with exercises and medication  - cut mobic down to PRN  - f/u PRN   Peroneal tendinitis She has about 75% improvement of her symptoms. Has a trip planned to [place]. No weakness on exam  - try pennsaid over the area - use compression and ice - counseled on HEP  - if no improvement consider Nitro patches vs PT

## 2017-06-21 NOTE — Assessment & Plan Note (Signed)
Has had improvement of her pain with exercises and medication  - cut mobic down to PRN  - f/u PRN

## 2017-06-21 NOTE — Assessment & Plan Note (Signed)
She has about 75% improvement of her symptoms. Has a trip planned to [place]. No weakness on exam  - try pennsaid over the area - use compression and ice - counseled on HEP  - if no improvement consider Nitro patches vs PT

## 2017-06-21 NOTE — Patient Instructions (Signed)
Good to see you  Please take the mobic as needed  You can use the pennsaid over the ankle as well  You good shoes when you're walking in [place]  Please try the exercises  Please follow up with me if your symptoms don't improve

## 2017-06-26 LAB — HM MAMMOGRAPHY

## 2017-07-29 ENCOUNTER — Other Ambulatory Visit: Payer: Self-pay | Admitting: Internal Medicine

## 2017-09-05 ENCOUNTER — Other Ambulatory Visit (INDEPENDENT_AMBULATORY_CARE_PROVIDER_SITE_OTHER): Payer: No Typology Code available for payment source

## 2017-09-05 DIAGNOSIS — E119 Type 2 diabetes mellitus without complications: Secondary | ICD-10-CM

## 2017-09-05 DIAGNOSIS — I10 Essential (primary) hypertension: Secondary | ICD-10-CM

## 2017-09-05 DIAGNOSIS — E785 Hyperlipidemia, unspecified: Secondary | ICD-10-CM

## 2017-09-05 LAB — CBC WITH DIFFERENTIAL/PLATELET
BASOS PCT: 0.7 % (ref 0.0–3.0)
Basophils Absolute: 0 10*3/uL (ref 0.0–0.1)
EOS PCT: 2.7 % (ref 0.0–5.0)
Eosinophils Absolute: 0.2 10*3/uL (ref 0.0–0.7)
HCT: 39 % (ref 36.0–46.0)
Hemoglobin: 12.9 g/dL (ref 12.0–15.0)
LYMPHS ABS: 1.9 10*3/uL (ref 0.7–4.0)
Lymphocytes Relative: 33.2 % (ref 12.0–46.0)
MCHC: 33.2 g/dL (ref 30.0–36.0)
MCV: 88.7 fl (ref 78.0–100.0)
MONOS PCT: 5.8 % (ref 3.0–12.0)
Monocytes Absolute: 0.3 10*3/uL (ref 0.1–1.0)
NEUTROS PCT: 57.6 % (ref 43.0–77.0)
Neutro Abs: 3.3 10*3/uL (ref 1.4–7.7)
Platelets: 217 10*3/uL (ref 150.0–400.0)
RBC: 4.39 Mil/uL (ref 3.87–5.11)
RDW: 14 % (ref 11.5–15.5)
WBC: 5.7 10*3/uL (ref 4.0–10.5)

## 2017-09-05 LAB — HEPATIC FUNCTION PANEL
ALBUMIN: 4.1 g/dL (ref 3.5–5.2)
ALT: 17 U/L (ref 0–35)
AST: 16 U/L (ref 0–37)
Alkaline Phosphatase: 52 U/L (ref 39–117)
Bilirubin, Direct: 0.1 mg/dL (ref 0.0–0.3)
Total Bilirubin: 0.4 mg/dL (ref 0.2–1.2)
Total Protein: 6.9 g/dL (ref 6.0–8.3)

## 2017-09-05 LAB — BASIC METABOLIC PANEL
BUN: 16 mg/dL (ref 6–23)
CALCIUM: 9.1 mg/dL (ref 8.4–10.5)
CO2: 24 mEq/L (ref 19–32)
Chloride: 106 mEq/L (ref 96–112)
Creatinine, Ser: 0.77 mg/dL (ref 0.40–1.20)
GFR: 82.31 mL/min (ref >60.00)
Glucose, Bld: 169 mg/dL — ABNORMAL HIGH (ref 70–99)
Potassium: 4.4 mEq/L (ref 3.5–5.1)
SODIUM: 139 meq/L (ref 135–145)

## 2017-09-05 LAB — URINALYSIS, ROUTINE W REFLEX MICROSCOPIC
Bilirubin Urine: NEGATIVE
Ketones, ur: NEGATIVE
LEUKOCYTES UA: NEGATIVE
NITRITE: NEGATIVE
PH: 5.5 (ref 5.0–8.0)
Specific Gravity, Urine: 1.02 (ref 1.000–1.030)
Total Protein, Urine: NEGATIVE
URINE GLUCOSE: NEGATIVE
Urobilinogen, UA: 0.2 (ref 0.0–1.0)
WBC, UA: NONE SEEN (ref 0–?)

## 2017-09-05 LAB — LIPID PANEL
CHOLESTEROL: 157 mg/dL (ref 0–200)
HDL: 43.7 mg/dL (ref >39.00)
LDL Cholesterol: 75 mg/dL (ref 0–99)
NonHDL: 113.11
Total CHOL/HDL Ratio: 4
Triglycerides: 192 mg/dL — ABNORMAL HIGH (ref 0.0–149.0)
VLDL: 38.4 mg/dL (ref 0.0–40.0)

## 2017-09-05 LAB — TSH: TSH: 1.93 u[IU]/mL (ref 0.35–4.50)

## 2017-09-05 LAB — MICROALBUMIN / CREATININE URINE RATIO
Creatinine,U: 51.3 mg/dL
Microalb Creat Ratio: 1.4 mg/g (ref 0.0–30.0)

## 2017-09-05 LAB — HEMOGLOBIN A1C: HEMOGLOBIN A1C: 6.6 % — AB (ref 4.6–6.5)

## 2017-09-10 ENCOUNTER — Ambulatory Visit (INDEPENDENT_AMBULATORY_CARE_PROVIDER_SITE_OTHER): Payer: No Typology Code available for payment source | Admitting: Internal Medicine

## 2017-09-10 ENCOUNTER — Encounter: Payer: Self-pay | Admitting: Internal Medicine

## 2017-09-10 VITALS — BP 134/82 | HR 83 | Temp 98.2°F | Ht 62.0 in | Wt 203.0 lb

## 2017-09-10 DIAGNOSIS — Z0000 Encounter for general adult medical examination without abnormal findings: Secondary | ICD-10-CM | POA: Diagnosis not present

## 2017-09-10 DIAGNOSIS — Z23 Encounter for immunization: Secondary | ICD-10-CM | POA: Diagnosis not present

## 2017-09-10 DIAGNOSIS — E119 Type 2 diabetes mellitus without complications: Secondary | ICD-10-CM

## 2017-09-10 DIAGNOSIS — F419 Anxiety disorder, unspecified: Secondary | ICD-10-CM

## 2017-09-10 MED ORDER — CITALOPRAM HYDROBROMIDE 20 MG PO TABS: 20.0000 mg | ORAL_TABLET | Freq: Every day | ORAL | 3 refills | Status: DC

## 2017-09-10 NOTE — Progress Notes (Signed)
Subjective:    Patient ID: Traci [name], female    DOB: December 19, 1960, 57 y.o.   MRN: [id number]  HPI Here for wellness and f/u;  Overall doing ok;  Pt denies Chest pain, worsening SOB, DOE, wheezing, orthopnea, PND, worsening LE edema, palpitations, dizziness or syncope.  Pt denies neurological change such as new headache, facial or extremity weakness.  Pt denies polydipsia, polyuria, or low sugar symptoms. Pt states overall good compliance with treatment and medications, good tolerability, and has been trying to follow appropriate diet.  Pt denies worsening depressive symptoms, suicidal ideation or panic, though has had increased anxiety and stress causing issues with her life. . No fever, night sweats, wt loss, loss of appetite, or other constitutional symptoms.  Pt states good ability with ADL's, has low fall risk, home safety reviewed and adequate, no other significant changes in hearing or vision, and only occasionally active with exercise.  No new complaints Past Medical History:  Diagnosis Date  . ALLERGIC RHINITIS 07/07/2008  . CONTACT DERMATITIS 11/30/2009  . DIABETES MELLITUS, TYPE II 11/29/2006  . Dizziness and giddiness 04/26/2007  . HYPERLIPIDEMIA 01/07/2007  . HYPERTENSION 07/08/2007  . PLANTAR FASCIITIS, LEFT 08/05/2007  . Preventative health care 07/30/2010   Past Surgical History:  Procedure Laterality Date  . MOUTH SURGERY     teeth extraction  . TUBAL LIGATION      reports that she has never smoked. She has never used smokeless tobacco. She reports that she drinks alcohol. She reports that she does not use drugs. family history includes Bipolar disorder in her maternal aunt; Cancer in her father, maternal aunt, and mother; Depression in her mother; Diabetes in her father, maternal aunt, maternal uncle, paternal aunt, and paternal uncle; Fibroids in her maternal aunt and paternal aunt; Glaucoma in her mother; Heart disease in her father and mother; Hyperlipidemia in her father,  mother, and paternal aunt; Hypertension in her father, maternal uncle, mother, and paternal aunt; Raynaud syndrome in her mother; Stroke in her father, mother, and paternal uncle; Thrombophilia in her father and paternal uncle; Thyroid disease in her paternal aunt; Varicose Veins in her mother. Allergies  Allergen Reactions  . Penicillins     REACTION: Rash   Current Outpatient Medications on File Prior to Visit  Medication Sig Dispense Refill  . aspirin 81 MG tablet Take 81 mg by mouth daily.      . Diclofenac Sodium (PENNSAID) 2 % SOLN Place 1 application onto the skin 2 (two) times daily. 1 Bottle 3  . glipiZIDE (GLUCOTROL XL) 5 MG 24 hr tablet Take 1 tablet (5 mg total) by mouth daily with breakfast. 90 tablet 3  . glucose blood test strip Use as directed once daily to check blood sugar.  Diagnosis code 250.00 100 each 11  . Lancets MISC Use as directed once daily to check blood sugar.  Diagnosis code 250.00 100 each 11  . lisinopril (PRINIVIL,ZESTRIL) 5 MG tablet TAKE 1 TABLET BY MOUTH ONCE DAILY 90 tablet 3  . meclizine (ANTIVERT) 12.5 MG tablet Take 1 tablet (12.5 mg total) by mouth 3 (three) times daily as needed. 40 tablet 1  . meloxicam (MOBIC) 15 MG tablet Take 1 tablet (15 mg total) by mouth daily. 30 tablet 0  . metFORMIN (GLUCOPHAGE) 1000 MG tablet TAKE ONE TABLET BY MOUTH TWICE A DAY WITH A MEAL 60 tablet 2  . pioglitazone (ACTOS) 45 MG tablet TAKE 1 TABLET BY MOUTH ONCE DAILY 30 tablet 2  . pravastatin (  PRAVACHOL) 40 MG tablet TAKE ONE TABLET BY MOUTH ONCE DAILY 90 tablet 3  . TRADJENTA 5 MG TABS tablet TAKE ONE TABLET BY MOUTH DAILY 30 tablet 9   No current facility-administered medications on file prior to visit.    Review of Systems Constitutional: Negative for other unusual diaphoresis, sweats, appetite or weight changes HENT: Negative for other worsening hearing loss, ear pain, facial swelling, mouth sores or neck stiffness.   Eyes: Negative for other worsening pain,  redness or other visual disturbance.  Respiratory: Negative for other stridor or swelling Cardiovascular: Negative for other palpitations or other chest pain  Gastrointestinal: Negative for worsening diarrhea or loose stools, blood in stool, distention or other pain Genitourinary: Negative for hematuria, flank pain or other change in urine volume.  Musculoskeletal: Negative for myalgias or other joint swelling.  Skin: Negative for other color change, or other wound or worsening drainage.  Neurological: Negative for other syncope or numbness. Hematological: Negative for other adenopathy or swelling Psychiatric/Behavioral: Negative for hallucinations, other worsening agitation, SI, self-injury, or new decreased concentration All other system neg per pt    Objective:   Physical Exam BP 134/82   Pulse 83   Temp 98.2 F (36.8 C) (Oral)   Ht 5\' 2"  (1.575 m)   Wt 203 lb (92.1 kg)   SpO2 97%   BMI 37.13 kg/m  VS noted,  Constitutional: Pt is oriented to person, place, and time. Appears well-developed and well-nourished, in no significant distress and comfortable Head: Normocephalic and atraumatic  Eyes: Conjunctivae and EOM are normal. Pupils are equal, round, and reactive to light Right Ear: External ear normal without discharge Left Ear: External ear normal without discharge Nose: Nose without discharge or deformity Mouth/Throat: Oropharynx is without other ulcerations and moist  Neck: Normal [name] of motion. Neck supple. No JVD present. No tracheal deviation present or significant neck LA or mass Cardiovascular: Normal rate, regular rhythm, normal heart sounds and intact distal pulses.   Pulmonary/Chest: WOB normal and breath sounds without rales or wheezing  Abdominal: Soft. Bowel sounds are normal. NT. No HSM  Musculoskeletal: Normal [name] of motion. Exhibits no edema Lymphadenopathy: Has no other cervical adenopathy.  Neurological: Pt is alert and oriented to person, place, and  time. Pt has normal reflexes. No cranial nerve deficit. Motor grossly intact, Gait intact Skin: Skin is warm and dry. No rash noted or new ulcerations Psychiatric:  Has normal mood and affect. Behavior is normal without agitation No other exam findings Lab Results  Component Value Date   WBC 5.7 09/05/2017   HGB 12.9 09/05/2017   HCT 39.0 09/05/2017   PLT 217.0 09/05/2017   GLUCOSE 169 (H) 09/05/2017   CHOL 157 09/05/2017   TRIG 192.0 (H) 09/05/2017   HDL 43.70 09/05/2017   LDLCALC 75 09/05/2017   ALT 17 09/05/2017   AST 16 09/05/2017   NA 139 09/05/2017   K 4.4 09/05/2017   CL 106 09/05/2017   CREATININE 0.77 09/05/2017   BUN 16 09/05/2017   CO2 24 09/05/2017   TSH 1.93 09/05/2017   HGBA1C 6.6 (H) 09/05/2017   MICROALBUR <0.7 09/05/2017       Assessment & Plan:

## 2017-09-10 NOTE — Assessment & Plan Note (Signed)
Ok for celexa 10 qd 

## 2017-09-10 NOTE — Patient Instructions (Signed)
You had the flu shot today  Please take all new medication as prescribed - the celexa 20 mg per day  Please continue all other medications as before, and refills have been done if requested.  Please have the pharmacy call with any other refills you may need.  Please continue your efforts at being more active, low cholesterol diet, and weight control.  You are otherwise up to date with prevention measures today.  Please keep your appointments with your specialists as you may have planned  Please return in 6 months, or sooner if needed, with Lab testing done 3-5 days before

## 2017-09-10 NOTE — Assessment & Plan Note (Signed)

## 2017-09-10 NOTE — Assessment & Plan Note (Signed)
stable overall by history and exam, recent data reviewed with pt, and pt to continue medical treatment as before,  to f/u any worsening symptoms or concerns Lab Results  Component Value Date   HGBA1C 6.6 (H) 09/05/2017

## 2017-09-28 ENCOUNTER — Other Ambulatory Visit: Payer: Self-pay | Admitting: Internal Medicine

## 2017-10-01 ENCOUNTER — Other Ambulatory Visit: Payer: Self-pay | Admitting: Internal Medicine

## 2017-10-29 ENCOUNTER — Other Ambulatory Visit: Payer: Self-pay | Admitting: Internal Medicine

## 2017-11-07 ENCOUNTER — Encounter: Payer: Self-pay | Admitting: Gynecology

## 2017-11-07 ENCOUNTER — Ambulatory Visit (INDEPENDENT_AMBULATORY_CARE_PROVIDER_SITE_OTHER): Payer: No Typology Code available for payment source | Admitting: Gynecology

## 2017-11-07 VITALS — BP 124/80 | Ht 64.0 in | Wt 203.0 lb

## 2017-11-07 DIAGNOSIS — Z1151 Encounter for screening for human papillomavirus (HPV): Secondary | ICD-10-CM | POA: Diagnosis not present

## 2017-11-07 DIAGNOSIS — Z01419 Encounter for gynecological examination (general) (routine) without abnormal findings: Secondary | ICD-10-CM

## 2017-11-07 NOTE — Patient Instructions (Signed)
Follow-up in 1 year for annual exam, sooner if any issues. 

## 2017-11-07 NOTE — Progress Notes (Signed)
    Traci [name] 06-16-1960 [id number]        57 y.o.  G2P2 for annual gynecologic exam.  Former patient of Dr. Toney Rakes.  Has not been in the office for a number of years.  Without gynecologic complaints.  Past medical history,surgical history, problem list, medications, allergies, family history and social history were all reviewed and documented as reviewed in the EPIC chart.  ROS:  Performed with pertinent positives and negatives included in the history, assessment and plan.   Additional significant findings : None   Exam: Caryn Bee assistant Vitals:   11/07/17 1522  BP: 124/80  Weight: 203 lb (92.1 kg)  Height: 5\' 4"  (1.626 m)   Body mass index is 34.84 kg/m.  General appearance:  Normal affect, orientation and appearance. Skin: Grossly normal HEENT: Without gross lesions.  No cervical or supraclavicular adenopathy. Thyroid normal.  Lungs:  Clear without wheezing, rales or rhonchi Cardiac: RR, without RMG Abdominal:  Soft, nontender, without masses, guarding, rebound, organomegaly or hernia Breasts:  Examined lying and sitting without masses, retractions, discharge or axillary adenopathy. Pelvic:  Ext, BUS, Vagina: Normal with atrophic changes  Cervix: Normal with atrophic changes.  Pap smear/HPV  Uterus: Difficult to palpate but no gross masses or tenderness.  Adnexa: Without masses or tenderness    Anus and perineum: Normal   Rectovaginal: Normal sphincter tone without palpated masses or tenderness.    Assessment/Plan:  57 y.o. G2P2 female for annual gynecologic exam.   1. Postmenopausal.  No significant menopausal symptoms or any vaginal bleeding.  Need to call if any vaginal bleeding. 2. Pap smear 2012.  Pap smear/HPV today.  No history of abnormal Pap smears by her history. 3. Mammography 06/2017.  Continue with annual mammography when due.  Breast exam normal today. 4. Colonoscopy 2016.  Repeat at their recommended interval. 5. DEXA never.  We will plan DEXA  at age 57. 45. Health maintenance.  Patient reports routine blood work done elsewhere.  Follow-up 1 year, sooner as needed.   Anastasio Auerbach MD, 3:45 PM 11/07/2017

## 2017-11-07 NOTE — Addendum Note (Signed)
Addended by: Nelva Nay on: 11/07/2017 03:49 PM   Modules accepted: Orders

## 2017-11-08 LAB — PAP IG AND HPV HIGH-RISK: HPV DNA High Risk: NOT DETECTED

## 2017-12-28 ENCOUNTER — Other Ambulatory Visit: Payer: Self-pay | Admitting: Internal Medicine

## 2018-03-06 ENCOUNTER — Other Ambulatory Visit (INDEPENDENT_AMBULATORY_CARE_PROVIDER_SITE_OTHER): Payer: No Typology Code available for payment source

## 2018-03-06 DIAGNOSIS — E119 Type 2 diabetes mellitus without complications: Secondary | ICD-10-CM

## 2018-03-06 LAB — HEPATIC FUNCTION PANEL
ALBUMIN: 4.2 g/dL (ref 3.5–5.2)
ALK PHOS: 52 U/L (ref 39–117)
ALT: 18 U/L (ref 0–35)
AST: 17 U/L (ref 0–37)
BILIRUBIN DIRECT: 0.1 mg/dL (ref 0.0–0.3)
Total Bilirubin: 0.3 mg/dL (ref 0.2–1.2)
Total Protein: 6.9 g/dL (ref 6.0–8.3)

## 2018-03-06 LAB — LIPID PANEL
CHOLESTEROL: 150 mg/dL (ref 0–200)
HDL: 50.6 mg/dL (ref >39.00)
LDL CALC: 77 mg/dL (ref 0–99)
NonHDL: 99.47
TRIGLYCERIDES: 111 mg/dL (ref 0.0–149.0)
Total CHOL/HDL Ratio: 3
VLDL: 22.2 mg/dL (ref 0.0–40.0)

## 2018-03-06 LAB — BASIC METABOLIC PANEL
BUN: 15 mg/dL (ref 6–23)
CHLORIDE: 105 meq/L (ref 96–112)
CO2: 26 mEq/L (ref 19–32)
CREATININE: 0.76 mg/dL (ref 0.40–1.20)
Calcium: 9 mg/dL (ref 8.4–10.5)
GFR: 78.47 mL/min (ref >60.00)
Glucose, Bld: 156 mg/dL — ABNORMAL HIGH (ref 70–99)
POTASSIUM: 4.4 meq/L (ref 3.5–5.1)
Sodium: 140 mEq/L (ref 135–145)

## 2018-03-06 LAB — HEMOGLOBIN A1C: Hgb A1c MFr Bld: 6.7 % — ABNORMAL HIGH (ref 4.6–6.5)

## 2018-03-13 ENCOUNTER — Encounter: Payer: Self-pay | Admitting: Internal Medicine

## 2018-03-13 ENCOUNTER — Ambulatory Visit: Payer: No Typology Code available for payment source | Admitting: Internal Medicine

## 2018-03-13 VITALS — BP 128/72 | HR 82 | Temp 98.7°F | Wt 212.1 lb

## 2018-03-13 DIAGNOSIS — R400 Somnolence: Secondary | ICD-10-CM

## 2018-03-13 DIAGNOSIS — I10 Essential (primary) hypertension: Secondary | ICD-10-CM | POA: Diagnosis not present

## 2018-03-13 DIAGNOSIS — E785 Hyperlipidemia, unspecified: Secondary | ICD-10-CM | POA: Diagnosis not present

## 2018-03-13 DIAGNOSIS — E119 Type 2 diabetes mellitus without complications: Secondary | ICD-10-CM

## 2018-03-13 MED ORDER — ALPRAZOLAM 0.5 MG PO TABS: ORAL_TABLET | ORAL | 0 refills | Status: DC

## 2018-03-13 MED ORDER — SCOPOLAMINE 1 MG/3DAYS TD PT72: 1.0000 | MEDICATED_PATCH | TRANSDERMAL | 12 refills | Status: DC

## 2018-03-13 NOTE — Progress Notes (Signed)
Subjective:    Patient ID: Traci [name], female    DOB: 1960/12/15, 58 y.o.   MRN: [id number]  HPI  Here to f/u; overall doing ok,  Pt denies chest pain, increasing sob or doe, wheezing, orthopnea, PND, increased LE swelling, palpitations, dizziness or syncope.  Pt denies new neurological symptoms such as new headache, or facial or extremity weakness or numbness.  Pt denies polydipsia, polyuria, or low sugar episode.  Pt states overall good compliance with meds, mostly trying to follow appropriate diet, with wt overall stable,  but little exercise however. Moves recently, more stress, eating out more.   Wt Readings from Last 3 Encounters:  03/13/18 212 lb 1.9 oz (96.2 kg)  11/07/17 203 lb (92.1 kg)  09/10/17 203 lb (92.1 kg)  Celexa helping with improved mood overall, wants to continue.  Does c/o ongoing fatigue, and also has signficant daytime hypersomnolence.  Also flying to [place] next month, needs refill for xanax and transderm scop. Past Medical History:  Diagnosis Date  . ALLERGIC RHINITIS 07/07/2008  . CONTACT DERMATITIS 11/30/2009  . DIABETES MELLITUS, TYPE II 11/29/2006  . Dizziness and giddiness 04/26/2007  . HYPERLIPIDEMIA 01/07/2007  . HYPERTENSION 07/08/2007  . PLANTAR FASCIITIS, LEFT 08/05/2007  . Preventative health care 07/30/2010   Past Surgical History:  Procedure Laterality Date  . MOUTH SURGERY     teeth extraction  . TUBAL LIGATION      reports that she has never smoked. She has never used smokeless tobacco. She reports current alcohol use. She reports that she does not use drugs. family history includes Bipolar disorder in her maternal aunt; Cancer in her father, maternal aunt, and mother; Depression in her mother; Diabetes in her father, maternal aunt, maternal uncle, paternal aunt, and paternal uncle; Fibroids in her maternal aunt and paternal aunt; Glaucoma in her mother; Heart disease in her father and mother; Hyperlipidemia in her father, mother, and paternal  aunt; Hypertension in her father, maternal uncle, mother, and paternal aunt; Raynaud syndrome in her mother; Stroke in her father, mother, and paternal uncle; Thrombophilia in her father and paternal uncle; Thyroid disease in her paternal aunt; Varicose Veins in her mother. Allergies  Allergen Reactions  . Penicillins     REACTION: Rash   Current Outpatient Medications on File Prior to Visit  Medication Sig Dispense Refill  . aspirin 81 MG tablet Take 81 mg by mouth daily.      . citalopram (CELEXA) 20 MG tablet Take 1 tablet (20 mg total) by mouth daily. 90 tablet 3  . glipiZIDE (GLUCOTROL XL) 5 MG 24 hr tablet Take 1 tablet (5 mg total) by mouth daily with breakfast. 90 tablet 3  . glucose blood test strip Use as directed once daily to check blood sugar.  Diagnosis code 250.00 100 each 11  . Lancets MISC Use as directed once daily to check blood sugar.  Diagnosis code 250.00 100 each 11  . lisinopril (PRINIVIL,ZESTRIL) 5 MG tablet TAKE 1 TABLET BY MOUTH ONCE DAILY 90 tablet 3  . meclizine (ANTIVERT) 12.5 MG tablet Take 1 tablet (12.5 mg total) by mouth 3 (three) times daily as needed. 40 tablet 1  . metFORMIN (GLUCOPHAGE) 1000 MG tablet TAKE ONE TABLET BY MOUTH TWICE A DAY WITH A MEAL 60 tablet 2  . pioglitazone (ACTOS) 45 MG tablet TAKE 1 TABLET BY MOUTH ONCE DAILY 90 tablet 2  . pravastatin (PRAVACHOL) 40 MG tablet TAKE 1 TABLET BY MOUTH ONCE DAILY 90 tablet 2  .  TRADJENTA 5 MG TABS tablet TAKE ONE TABLET BY MOUTH DAILY 30 tablet 11   No current facility-administered medications on file prior to visit.    Review of Systems  Constitutional: Negative for other unusual diaphoresis or sweats HENT: Negative for ear discharge or swelling Eyes: Negative for other worsening visual disturbances Respiratory: Negative for stridor or other swelling  Gastrointestinal: Negative for worsening distension or other blood Genitourinary: Negative for retention or other urinary change Musculoskeletal:  Negative for other MSK pain or swelling Skin: Negative for color change or other new lesions Neurological: Negative for worsening tremors and other numbness  Psychiatric/Behavioral: Negative for worsening agitation or other fatigue All other system neg per pt    Objective:   Physical Exam BP 128/72   Pulse 82   Temp 98.7 F (37.1 C) (Oral)   Wt 212 lb 1.9 oz (96.2 kg)   SpO2 98%   BMI 36.41 kg/m  VS noted,  Constitutional: Pt appears in NAD HENT: Head: NCAT.  Right Ear: External ear normal.  Left Ear: External ear normal.  Eyes: . Pupils are equal, round, and reactive to light. Conjunctivae and EOM are normal Nose: without d/c or deformity Neck: Neck supple. Gross normal ROM Cardiovascular: Normal rate and regular rhythm.   Pulmonary/Chest: Effort normal and breath sounds without rales or wheezing.  Abd:  Soft, NT, ND, + BS, no organomegaly Neurological: Pt is alert. At baseline orientation, motor grossly intact Skin: Skin is warm. No rashes, other new lesions, no LE edema Psychiatric: Pt behavior is normal without agitation  No other exam findings Lab Results  Component Value Date   WBC 5.7 09/05/2017   HGB 12.9 09/05/2017   HCT 39.0 09/05/2017   PLT 217.0 09/05/2017   GLUCOSE 156 (H) 03/06/2018   CHOL 150 03/06/2018   TRIG 111.0 03/06/2018   HDL 50.60 03/06/2018   LDLCALC 77 03/06/2018   ALT 18 03/06/2018   AST 17 03/06/2018   NA 140 03/06/2018   K 4.4 03/06/2018   CL 105 03/06/2018   CREATININE 0.76 03/06/2018   BUN 15 03/06/2018   CO2 26 03/06/2018   TSH 1.93 09/05/2017   HGBA1C 6.7 (H) 03/06/2018   MICROALBUR <0.7 09/05/2017       Assessment & Plan:

## 2018-03-13 NOTE — Assessment & Plan Note (Signed)
?   [facility] - for pulm referral

## 2018-03-13 NOTE — Assessment & Plan Note (Signed)
stable overall by history and exam, recent data reviewed with pt, and pt to continue medical treatment as before,  to f/u any worsening symptoms or concerns  

## 2018-03-13 NOTE — Patient Instructions (Signed)
Please take all new medication as prescribed - the xanax and patch as needed  Please continue all other medications as before, and refills have been done if requested.  Please have the pharmacy call with any other refills you may need.  Please continue your efforts at being more active, low cholesterol diet, and weight control.  You are otherwise up to date with prevention measures today.  Please keep your appointments with your specialists as you may have planned  You will be contacted regarding the referral for: Pulmonary for possible sleep apnea  Please return in 6 months, or sooner if needed, with Lab testing done 3-5 days before

## 2018-03-14 ENCOUNTER — Other Ambulatory Visit: Payer: Self-pay | Admitting: Internal Medicine

## 2018-03-30 ENCOUNTER — Other Ambulatory Visit: Payer: Self-pay | Admitting: Internal Medicine

## 2018-04-10 ENCOUNTER — Ambulatory Visit (INDEPENDENT_AMBULATORY_CARE_PROVIDER_SITE_OTHER): Payer: No Typology Code available for payment source | Admitting: Internal Medicine

## 2018-04-10 ENCOUNTER — Telehealth: Payer: Self-pay | Admitting: Internal Medicine

## 2018-04-10 ENCOUNTER — Other Ambulatory Visit: Payer: Self-pay

## 2018-04-10 ENCOUNTER — Encounter: Payer: Self-pay | Admitting: Internal Medicine

## 2018-04-10 DIAGNOSIS — I10 Essential (primary) hypertension: Secondary | ICD-10-CM | POA: Diagnosis not present

## 2018-04-10 DIAGNOSIS — E119 Type 2 diabetes mellitus without complications: Secondary | ICD-10-CM

## 2018-04-10 DIAGNOSIS — J0100 Acute maxillary sinusitis, unspecified: Secondary | ICD-10-CM | POA: Diagnosis not present

## 2018-04-10 MED ORDER — HYDROCODONE-HOMATROPINE 5-1.5 MG/5ML PO SYRP: 5.0000 mL | ORAL_SOLUTION | Freq: Four times a day (QID) | ORAL | 0 refills | Status: DC | PRN

## 2018-04-10 MED ORDER — TRIAMCINOLONE ACETONIDE 55 MCG/ACT NA AERO: 2.0000 | INHALATION_SPRAY | Freq: Every day | NASAL | 12 refills | Status: DC

## 2018-04-10 MED ORDER — LEVOFLOXACIN 500 MG PO TABS: 500.0000 mg | ORAL_TABLET | Freq: Every day | ORAL | 0 refills | Status: AC

## 2018-04-10 NOTE — Assessment & Plan Note (Signed)
Urged to check BP at home on regular basis, with goal < 140;90

## 2018-04-10 NOTE — Assessment & Plan Note (Signed)
stable overall by history and exam, recent data reviewed with pt, and pt to continue medical treatment as before,  to f/u any worsening symptoms or concerns  

## 2018-04-10 NOTE — Progress Notes (Signed)
Patient ID: Traci [name], female   DOB: April 28, 1960, 58 y.o.   MRN: [id number] \\Virtual  Visit via Video Note  I connected with [id number] on 04/10/18 at  1:20 PM EDT by a video enabled telemedicine application and verified that I am speaking with the correct person using two identifiers. Pt is at home, and I am in office, and no other pserons present   I discussed the limitations of evaluation and management by telemedicine and the availability of in person appointments. The patient expressed understanding and agreed to proceed.  History of Present Illness:  Here with 7-8 days acute onset fever, facial pain, pressure, headache, general weakness and malaise, and greenish d/c, with mild ST and cough, but pt denies chest pain, wheezing, increased sob or doe, orthopnea, PND, increased LE swelling, palpitations, dizziness or syncope. Tussin otc and saline not helping,  Nothing else makes better or worse.  Does have several wks ongoing nasal allergy symptoms with clearish congestion, itch and sneezing, without fever, pain, ST, cough, swelling or wheezing.  Pt denies polydipsia, polyuria Past Medical History:  Diagnosis Date  . ALLERGIC RHINITIS 07/07/2008  . CONTACT DERMATITIS 11/30/2009  . DIABETES MELLITUS, TYPE II 11/29/2006  . Dizziness and giddiness 04/26/2007  . HYPERLIPIDEMIA 01/07/2007  . HYPERTENSION 07/08/2007  . PLANTAR FASCIITIS, LEFT 08/05/2007  . Preventative health care 07/30/2010   Past Surgical History:  Procedure Laterality Date  . MOUTH SURGERY     teeth extraction  . TUBAL LIGATION      reports that she has never smoked. She has never used smokeless tobacco. She reports current alcohol use. She reports that she does not use drugs. family history includes Bipolar disorder in her maternal aunt; Cancer in her father, maternal aunt, and mother; Depression in her mother; Diabetes in her father, maternal aunt, maternal uncle, paternal aunt, and paternal uncle; Fibroids in her  maternal aunt and paternal aunt; Glaucoma in her mother; Heart disease in her father and mother; Hyperlipidemia in her father, mother, and paternal aunt; Hypertension in her father, maternal uncle, mother, and paternal aunt; Raynaud syndrome in her mother; Stroke in her father, mother, and paternal uncle; Thrombophilia in her father and paternal uncle; Thyroid disease in her paternal aunt; Varicose Veins in her mother. Allergies  Allergen Reactions  . Penicillins     REACTION: Rash   Current Outpatient Medications on File Prior to Visit  Medication Sig Dispense Refill  . ALPRAZolam (XANAX) 0.5 MG tablet 1-2 tab by mouth as needed with travel 8 tablet 0  . aspirin 81 MG tablet Take 81 mg by mouth daily.      . citalopram (CELEXA) 20 MG tablet Take 1 tablet (20 mg total) by mouth daily. 90 tablet 3  . glipiZIDE (GLUCOTROL XL) 5 MG 24 hr tablet Take 1 tablet by mouth once daily with breakfast 30 tablet 5  . glucose blood test strip Use as directed once daily to check blood sugar.  Diagnosis code 250.00 100 each 11  . Lancets MISC Use as directed once daily to check blood sugar.  Diagnosis code 250.00 100 each 11  . lisinopril (PRINIVIL,ZESTRIL) 5 MG tablet TAKE 1 TABLET BY MOUTH ONCE DAILY 90 tablet 3  . meclizine (ANTIVERT) 12.5 MG tablet Take 1 tablet (12.5 mg total) by mouth 3 (three) times daily as needed. 40 tablet 1  . metFORMIN (GLUCOPHAGE) 1000 MG tablet TAKE ONE TABLET BY MOUTH TWICE A DAY WITH MEALS 60 tablet 5  . pioglitazone (ACTOS) 45  MG tablet TAKE 1 TABLET BY MOUTH ONCE DAILY 90 tablet 2  . pravastatin (PRAVACHOL) 40 MG tablet TAKE 1 TABLET BY MOUTH ONCE DAILY 90 tablet 2  . scopolamine (TRANSDERM-SCOP, 1.5 MG,) 1 MG/3DAYS Place 1 patch (1.5 mg total) onto the skin every 3 (three) days. 10 patch 12  . TRADJENTA 5 MG TABS tablet TAKE ONE TABLET BY MOUTH DAILY 30 tablet 11   No current facility-administered medications on file prior to visit.     Observations/Objective: Alert, non  confused, mild ill appearing, fatigued sitting on couch, flushed with visible swelling over the bilat maxillary sinus areas and bilat submandib areas left > right, cn 2-12 intact, resps seem normal rate and no accessory muscle use or visible rash Past Medical History:  Diagnosis Date  . ALLERGIC RHINITIS 07/07/2008  . CONTACT DERMATITIS 11/30/2009  . DIABETES MELLITUS, TYPE II 11/29/2006  . Dizziness and giddiness 04/26/2007  . HYPERLIPIDEMIA 01/07/2007  . HYPERTENSION 07/08/2007  . PLANTAR FASCIITIS, LEFT 08/05/2007  . Preventative health care 07/30/2010   Past Surgical History:  Procedure Laterality Date  . MOUTH SURGERY     teeth extraction  . TUBAL LIGATION      reports that she has never smoked. She has never used smokeless tobacco. She reports current alcohol use. She reports that she does not use drugs. family history includes Bipolar disorder in her maternal aunt; Cancer in her father, maternal aunt, and mother; Depression in her mother; Diabetes in her father, maternal aunt, maternal uncle, paternal aunt, and paternal uncle; Fibroids in her maternal aunt and paternal aunt; Glaucoma in her mother; Heart disease in her father and mother; Hyperlipidemia in her father, mother, and paternal aunt; Hypertension in her father, maternal uncle, mother, and paternal aunt; Raynaud syndrome in her mother; Stroke in her father, mother, and paternal uncle; Thrombophilia in her father and paternal uncle; Thyroid disease in her paternal aunt; Varicose Veins in her mother. Allergies  Allergen Reactions  . Penicillins     REACTION: Rash   Assessment and Plan: Acute sinusitis - Mild to mod, for antibx course, cough med prn, to f/u any worsening symptoms or concerns  Allergies - for add nasacort asd,  to f/u any worsening symptoms or concerns  DM - no worsening symptoms, stable overall by history and exam, recent data reviewed with pt, and pt to continue medical treatment as before,  to f/u any  worsening symptoms or concerns  HTN - urged to take BP on regular basis at home with goal < 130/80  Follow Up Instructions: See above, f/u prn   I discussed the assessment and treatment plan with the patient. The patient was provided an opportunity to ask questions and all were answered. The patient agreed with the plan and demonstrated an understanding of the instructions.   The patient was advised to call back or seek an in-person evaluation if the symptoms worsen or if the condition fails to improve as anticipated.   Cathlean Cower, MD

## 2018-04-10 NOTE — Telephone Encounter (Signed)
Copied from [place] [id number]. Topic: Quick Communication - Rx Refill/Question >> Apr 10, 2018  6:17 PM Loma [place] wrote: PT saw Dr Waynard Reeds on Virtual Visit today... 2 meds not processed one to be resent and another not available at her pharmacy and had to reach out to a different one  CRM for notification. See Telephone encounter for: 04/10/18.triamcinolone (NASACORT) 55 MCG/ACT AERO nasal inhaler pharmacy did not receive , please resend script [place], [place] [address] [phone] (Phone) [phone] (Fax)Additionally Different script different pharmacy !!!HYDROcodone-homatropine (HYCODAN) 5-1.5 MG/5ML syrup was not available at [facility], please sent to Krist[facility] Oppenheim at [place] Phone [phone]

## 2018-04-10 NOTE — Patient Instructions (Signed)
See above

## 2018-04-11 MED ORDER — TRIAMCINOLONE ACETONIDE 55 MCG/ACT NA AERO: 2.0000 | INHALATION_SPRAY | Freq: Every day | NASAL | 12 refills | Status: DC

## 2018-04-11 MED ORDER — HYDROCODONE-HOMATROPINE 5-1.5 MG/5ML PO SYRP: 5.0000 mL | ORAL_SOLUTION | Freq: Four times a day (QID) | ORAL | 0 refills | Status: AC | PRN

## 2018-04-11 NOTE — Telephone Encounter (Signed)
[place] for both as requested - done erx

## 2018-04-11 NOTE — Addendum Note (Signed)
Addended by: Biagio Borg on: 04/11/2018 11:41 AM   Modules accepted: Orders

## 2018-04-14 ENCOUNTER — Ambulatory Visit: Payer: Self-pay | Admitting: *Deleted

## 2018-04-14 NOTE — Telephone Encounter (Signed)
Patient just had virtual visit on 4/2

## 2018-04-14 NOTE — Telephone Encounter (Signed)
Patient had visit on Thursday last week. Patient's daughter( from [place]) has been staying with her for 21 days. Patient's daughter had been exposed(+ COVID-19) before she left- she had chills and fatigue that where the main symptoms on the Thursday after she arrived. Daughter is doing better. Patient's son also got symptoms- but he has since improved too. Patient is doing better- her cough is better. Patient is not as fatigued- she is still coughing at night. She still only reports cough- no fever or respiratory symptoms.    Patient states she has not returned- but she would like to go back as long as PCP feels it is ok. Patient needs to know if he prefers she do a 14 day isolation from work- or if he thinks she should go back now.

## 2018-04-14 NOTE — Telephone Encounter (Signed)
Pt has been informed.   She is still taking the antibiotic and has 6 day left. She has not went to work since last Thursday 04/10/2018. What dates should the work note say? Please advise.

## 2018-04-14 NOTE — Telephone Encounter (Signed)
Letter has been done  Mailed to pt as she requested

## 2018-04-14 NOTE — Telephone Encounter (Signed)
Pt should stay home at least 7 days after the last of the symptoms have gone away, which for most people is 14 days  [place] for work note if needed

## 2018-04-14 NOTE — Telephone Encounter (Signed)
Ok for return to work next Monday, thanks

## 2018-06-17 ENCOUNTER — Ambulatory Visit: Payer: Self-pay | Admitting: Internal Medicine

## 2018-06-17 NOTE — Telephone Encounter (Signed)
Pt. Reports she was walking with her family yesterday evening and fell. She broke her fall with her arms, but still hit her right side of her face. Her right eye is swollen shut. Arms are scraped and sore as well. Per [place], Dr. Jenny Reichmann recommends ED for evaluation. Pt. Reports she would rather go to [facility] because of her insurance. Instructed to be seen today, not to wait. Verbalizes understanding. Reason for Disposition . One or two "black eyes" (bruising, purple color of eyelids)  Answer Assessment - Initial Assessment Questions 1. MECHANISM: "How did the injury happen?" For falls, ask: "What height did you fall from?" and "What surface did you fall against?"      Fell while walking.Both arms broke her fall and hit face forward 2. ONSET: "When did the injury happen?" (Minutes or hours ago)      Yesterday - evening 3. NEUROLOGIC SYMPTOMS: "Was there any loss of consciousness?" "Are there any other neurological symptoms?"      No 4. MENTAL STATUS: "Does the person know who he is, who you are, and where he is?"      Alert and oriented 5. LOCATION: "What part of the head was hit?"      Right side of face - forehead nose and cheek. 6. SCALP APPEARANCE: "What does the scalp look like? Is it bleeding now?" If so, ask: "Is it difficult to stop?"      No laceration - scrapes 7. SIZE: For cuts, bruises, or swelling, ask: "How large is it?" (e.g., inches or centimeters)      Bruising to face - right eye swollen shut 8. PAIN: "Is there any pain?" If so, ask: "How bad is it?"  (e.g., Scale 1-10; or mild, moderate, severe)     6 9. TETANUS: For any breaks in the skin, ask: "When was the last tetanus booster?"     Unsure 10. OTHER SYMPTOMS: "Do you have any other symptoms?" (e.g., neck pain, vomiting)       No 11. PREGNANCY: "Is there any chance you are pregnant?" "When was your last menstrual period?"       No  Protocols used: HEAD INJURY-A-AH

## 2018-06-17 NOTE — Telephone Encounter (Signed)
Per PCP pt should go to the ED for CT scan. Triage was informed.

## 2018-06-18 DIAGNOSIS — M25522 Pain in left elbow: Secondary | ICD-10-CM | POA: Insufficient documentation

## 2018-06-18 DIAGNOSIS — M25521 Pain in right elbow: Secondary | ICD-10-CM | POA: Insufficient documentation

## 2018-06-18 DIAGNOSIS — M25532 Pain in left wrist: Secondary | ICD-10-CM | POA: Insufficient documentation

## 2018-06-18 DIAGNOSIS — S52131A Displaced fracture of neck of right radius, initial encounter for closed fracture: Secondary | ICD-10-CM | POA: Insufficient documentation

## 2018-06-18 DIAGNOSIS — M25531 Pain in right wrist: Secondary | ICD-10-CM | POA: Insufficient documentation

## 2018-06-18 DIAGNOSIS — W101XXA Fall (on)(from) sidewalk curb, initial encounter: Secondary | ICD-10-CM | POA: Insufficient documentation

## 2018-06-30 LAB — HM MAMMOGRAPHY

## 2018-07-09 LAB — HM DIABETES EYE EXAM

## 2018-07-20 ENCOUNTER — Other Ambulatory Visit: Payer: Self-pay | Admitting: Internal Medicine

## 2018-07-23 ENCOUNTER — Ambulatory Visit (INDEPENDENT_AMBULATORY_CARE_PROVIDER_SITE_OTHER): Payer: No Typology Code available for payment source | Admitting: Internal Medicine

## 2018-07-23 DIAGNOSIS — Z20828 Contact with and (suspected) exposure to other viral communicable diseases: Secondary | ICD-10-CM | POA: Diagnosis not present

## 2018-07-23 DIAGNOSIS — R509 Fever, unspecified: Secondary | ICD-10-CM

## 2018-07-23 DIAGNOSIS — H6982 Other specified disorders of Eustachian tube, left ear: Secondary | ICD-10-CM | POA: Diagnosis not present

## 2018-07-23 DIAGNOSIS — E119 Type 2 diabetes mellitus without complications: Secondary | ICD-10-CM | POA: Diagnosis not present

## 2018-07-23 LAB — NOVEL CORONAVIRUS, NAA: SARS-CoV-2, NAA: NOT DETECTED

## 2018-07-23 MED ORDER — AZITHROMYCIN 250 MG PO TABS: ORAL_TABLET | ORAL | 1 refills | Status: DC

## 2018-07-23 MED ORDER — GUAIFENESIN ER 600 MG PO TB12: 1200.0000 mg | ORAL_TABLET | Freq: Two times a day (BID) | ORAL | 1 refills | Status: DC | PRN

## 2018-07-23 NOTE — Patient Instructions (Signed)
Please take all new medication as prescribed - the antibiotic, and mucinex as needed  Please proceed to the CoVID testing as we discussed  You are given the work note  Please continue all other medications as before, and refills have been done if requested.  Please have the pharmacy call with any other refills you may need.  Please keep your appointments with your specialists as you may have planned

## 2018-07-23 NOTE — Progress Notes (Signed)
Patient ID: Traci [name], female   DOB: 1960/06/06, 58 y.o.   MRN: [id number]  Virtual Visit via Video Note  I connected with Traci [name] on 07/23/18 at 11:00 AM EDT by a video enabled telemedicine application and verified that I am speaking with the correct person using two identifiers.  Location: Patient: at home Provider: at office   I discussed the limitations of evaluation and management by telemedicine and the availability of in person appointments. The patient expressed understanding and agreed to proceed.  History of Present Illness:  Here with 2-3 days acute onset fever, left ear pain, pressure, headache, general weakness and malaise, and greenish d/c, with mild ST and cough, but pt denies chest pain, wheezing, increased sob or doe, orthopnea, PND, increased LE swelling, palpitations, dizziness or syncope. Temp yesterday was 101,, then 100.5 this am.  Has friend with URI symptoms and proven Covid +.  Also with left ear hearing muffled with popping and crackling.  Pt denies chest pain, increased sob or doe, wheezing, orthopnea, PND, increased LE swelling, palpitations, dizziness or syncope.   Pt denies polydipsia, polyuria Past Medical History:  Diagnosis Date  . ALLERGIC RHINITIS 07/07/2008  . CONTACT DERMATITIS 11/30/2009  . DIABETES MELLITUS, TYPE II 11/29/2006  . Dizziness and giddiness 04/26/2007  . HYPERLIPIDEMIA 01/07/2007  . HYPERTENSION 07/08/2007  . PLANTAR FASCIITIS, LEFT 08/05/2007  . Preventative health care 07/30/2010   Past Surgical History:  Procedure Laterality Date  . MOUTH SURGERY     teeth extraction  . TUBAL LIGATION      reports that she has never smoked. She has never used smokeless tobacco. She reports current alcohol use. She reports that she does not use drugs. family history includes Bipolar disorder in her maternal aunt; Cancer in her father, maternal aunt, and mother; Depression in her mother; Diabetes in her father, maternal aunt, maternal uncle,  paternal aunt, and paternal uncle; Fibroids in her maternal aunt and paternal aunt; Glaucoma in her mother; Heart disease in her father and mother; Hyperlipidemia in her father, mother, and paternal aunt; Hypertension in her father, maternal uncle, mother, and paternal aunt; Raynaud syndrome in her mother; Stroke in her father, mother, and paternal uncle; Thrombophilia in her father and paternal uncle; Thyroid disease in her paternal aunt; Varicose Veins in her mother. Allergies  Allergen Reactions  . Penicillins     REACTION: Rash   Current Outpatient Medications on File Prior to Visit  Medication Sig Dispense Refill  . ALPRAZolam (XANAX) 0.5 MG tablet 1-2 tab by mouth as needed with travel 8 tablet 0  . aspirin 81 MG tablet Take 81 mg by mouth daily.      . citalopram (CELEXA) 20 MG tablet Take 1 tablet (20 mg total) by mouth daily. 90 tablet 3  . glipiZIDE (GLUCOTROL XL) 5 MG 24 hr tablet Take 1 tablet by mouth once daily with breakfast 30 tablet 5  . glucose blood test strip Use as directed once daily to check blood sugar.  Diagnosis code 250.00 100 each 11  . Lancets MISC Use as directed once daily to check blood sugar.  Diagnosis code 250.00 100 each 11  . lisinopril (ZESTRIL) 5 MG tablet Take 1 tablet by mouth once daily 30 tablet 0  . meclizine (ANTIVERT) 12.5 MG tablet Take 1 tablet (12.5 mg total) by mouth 3 (three) times daily as needed. 40 tablet 1  . metFORMIN (GLUCOPHAGE) 1000 MG tablet TAKE ONE TABLET BY MOUTH TWICE A DAY WITH MEALS  60 tablet 5  . pioglitazone (ACTOS) 45 MG tablet Take 1 tablet by mouth once daily 90 tablet 0  . pravastatin (PRAVACHOL) 40 MG tablet Take 1 tablet by mouth once daily 90 tablet 0  . scopolamine (TRANSDERM-SCOP, 1.5 MG,) 1 MG/3DAYS Place 1 patch (1.5 mg total) onto the skin every 3 (three) days. 10 patch 12  . TRADJENTA 5 MG TABS tablet TAKE ONE TABLET BY MOUTH DAILY 30 tablet 11  . triamcinolone (NASACORT) 55 MCG/ACT AERO nasal inhaler Place 2 sprays  into the nose daily. 1 Inhaler 12   No current facility-administered medications on file prior to visit.     Observations/Objective: Alert, NAD, appropriate mood and affect, resps normal, cn 2-12 intact, moves all 4s, no visible rash or swelling Lab Results  Component Value Date   WBC 5.7 09/05/2017   HGB 12.9 09/05/2017   HCT 39.0 09/05/2017   PLT 217.0 09/05/2017   GLUCOSE 156 (H) 03/06/2018   CHOL 150 03/06/2018   TRIG 111.0 03/06/2018   HDL 50.60 03/06/2018   LDLCALC 77 03/06/2018   ALT 18 03/06/2018   AST 17 03/06/2018   NA 140 03/06/2018   K 4.4 03/06/2018   CL 105 03/06/2018   CREATININE 0.76 03/06/2018   BUN 15 03/06/2018   CO2 26 03/06/2018   TSH 1.93 09/05/2017   HGBA1C 6.7 (H) 03/06/2018   MICROALBUR <0.7 09/05/2017   Assessment and Plan: See notes  Follow Up Instructions: seenotes   I discussed the assessment and treatment plan with the patient. The patient was provided an opportunity to ask questions and all were answered. The patient agreed with the plan and demonstrated an understanding of the instructions.   The patient was advised to call back or seek an in-person evaluation if the symptoms worsen or if the condition fails to improve as anticipated.   Cathlean Cower, MD

## 2018-07-27 ENCOUNTER — Encounter: Payer: Self-pay | Admitting: Internal Medicine

## 2018-07-27 DIAGNOSIS — R509 Fever, unspecified: Secondary | ICD-10-CM | POA: Insufficient documentation

## 2018-07-27 NOTE — Assessment & Plan Note (Signed)
C/w possible left otitis media, for zpack, also refer COVID testing,  to f/u any worsening symptoms or concerns

## 2018-07-27 NOTE — Assessment & Plan Note (Signed)
For mucinex otc prn,  to f/u any worsening symptoms or concerns  

## 2018-07-27 NOTE — Assessment & Plan Note (Signed)
stable overall by history and exam, recent data reviewed with pt, and pt to continue medical treatment as before,  to f/u any worsening symptoms or concerns  

## 2018-07-28 ENCOUNTER — Telehealth: Payer: Self-pay

## 2018-07-28 NOTE — Telephone Encounter (Signed)
Ok to extend the work note  I do not know how "post" on mychart for pt to access online

## 2018-07-28 NOTE — Telephone Encounter (Signed)
Copied from [place] [id number]. Topic: General - Other >> Jul 28, 2018  3:56 PM Nils Flack, Marland Kitchen wrote: Reason for CRM: pt has not gotten her covid test results back yet, she needs her work note extended.  Please post a new work note to [employer] so  she can give to employer.   Cb is [phone]

## 2018-07-29 NOTE — Telephone Encounter (Signed)
Pt has been informed that we must had covid results in order to extend the note. She expressed understanding.

## 2018-07-29 NOTE — Telephone Encounter (Signed)
I will mail the note once it is given to me.

## 2018-07-29 NOTE — Telephone Encounter (Signed)
I see no covid test result so cannot know a date to extend the note to

## 2018-08-01 NOTE — Telephone Encounter (Signed)
Patient called stating that she was tested at [facility].  Test results are not showing.  Patient states when she went for testing they were having computer issues.  Please advise pt what to do next.

## 2018-08-04 ENCOUNTER — Encounter: Payer: Self-pay | Admitting: Internal Medicine

## 2018-08-04 NOTE — Telephone Encounter (Signed)
Patient calling again about results and work note. She is requesting a call back.

## 2018-08-04 NOTE — Telephone Encounter (Signed)
Checking with testing site and [facility].

## 2018-08-05 ENCOUNTER — Encounter: Payer: Self-pay | Admitting: Internal Medicine

## 2018-08-06 NOTE — Telephone Encounter (Signed)
Results for 7/15 test located by [facility], given to [place] on 08/04/2018 for work note.

## 2018-08-21 ENCOUNTER — Other Ambulatory Visit: Payer: Self-pay

## 2018-08-21 ENCOUNTER — Encounter: Payer: Self-pay | Admitting: Internal Medicine

## 2018-08-21 ENCOUNTER — Ambulatory Visit (INDEPENDENT_AMBULATORY_CARE_PROVIDER_SITE_OTHER): Payer: No Typology Code available for payment source | Admitting: Internal Medicine

## 2018-08-21 VITALS — BP 128/82 | HR 84 | Temp 98.2°F | Ht 62.0 in | Wt 201.6 lb

## 2018-08-21 DIAGNOSIS — R400 Somnolence: Secondary | ICD-10-CM

## 2018-08-21 DIAGNOSIS — G4733 Obstructive sleep apnea (adult) (pediatric): Secondary | ICD-10-CM

## 2018-08-21 NOTE — Patient Instructions (Signed)
Order- please schedule unattended home sleep test or Split protocol sleep study   Dx OSA  Please call me a bout 2 weeks after your sleep test to see if results and recommendations are ready yet. If appropriate, we maybe able to start treatment before we wee you next.

## 2018-08-21 NOTE — Progress Notes (Signed)
08/21/2018- 58 yoF never smoker Referred by Dr. Jenny Reichmann (PCP) for daytime somnolence. pt reports occasionally waking up in the middle of the night Medical problem list includes HBP, Allergic Rhinitis, DM, Hyperlipidemia,  Covid swab Neg 07/23/18  Body weight today 201 lbs Epworth score 6        Works as Surveyor, minerals. Med list includes alprazolam 0.5 mg, Celexa, Meclizine, TransDerm Scop Husband tells her of loud snoring, witnessed apneas. Occ wakes gasping. Parents snored. Unrested, sleepy around 11 AM. No sleep meds. 1-2 cups coffee. Alprazolam only for airplane travel. Occasional naps with grandchildren. No parasomnias- rare sleep talking.  Denies ENT surgery, lung/ heart or CNS problem.  Prior to Admission medications   Medication Sig Start Date End Date Taking? Authorizing Provider  ALPRAZolam Duanne Moron) 0.5 MG tablet 1-2 tab by mouth as needed with travel 03/13/18  Yes Biagio Borg, MD  aspirin 81 MG tablet Take 81 mg by mouth daily.     Yes [provider]  citalopram (CELEXA) 20 MG tablet Take 1 tablet (20 mg total) by mouth daily. 09/10/17  Yes Biagio Borg, MD  glipiZIDE (GLUCOTROL XL) 5 MG 24 hr tablet Take 1 tablet by mouth once daily with breakfast 03/14/18  Yes Biagio Borg, MD  glucose blood test strip Use as directed once daily to check blood sugar.  Diagnosis code 250.00 06/19/13  Yes Biagio Borg, MD  Lancets MISC Use as directed once daily to check blood sugar.  Diagnosis code 250.00 06/19/13  Yes Biagio Borg, MD  lisinopril (ZESTRIL) 5 MG tablet Take 1 tablet by mouth once daily 07/21/18  Yes Biagio Borg, MD  meclizine (ANTIVERT) 12.5 MG tablet Take 1 tablet (12.5 mg total) by mouth 3 (three) times daily as needed. 08/12/14  Yes Biagio Borg, MD  metFORMIN (GLUCOPHAGE) 1000 MG tablet TAKE ONE TABLET BY MOUTH TWICE A DAY WITH MEALS 03/31/18  Yes Biagio Borg, MD  pioglitazone (ACTOS) 45 MG tablet Take 1 tablet by mouth once daily 07/21/18  Yes Biagio Borg, MD  pravastatin  (PRAVACHOL) 40 MG tablet Take 1 tablet by mouth once daily 07/21/18  Yes Biagio Borg, MD  scopolamine (TRANSDERM-SCOP, 1.5 MG,) 1 MG/3DAYS Place 1 patch (1.5 mg total) onto the skin every 3 (three) days. 03/13/18  Yes Biagio Borg, MD  TRADJENTA 5 MG TABS tablet TAKE ONE TABLET BY MOUTH DAILY 09/30/17  Yes Biagio Borg, MD  triamcinolone (NASACORT) 55 MCG/ACT AERO nasal inhaler Place 2 sprays into the nose daily. 04/11/18  Yes Biagio Borg, MD  guaiFENesin (MUCINEX) 600 MG 12 hr tablet Take 2 tablets (1,200 mg total) by mouth 2 (two) times daily as needed. Patient not taking: Reported on 08/21/2018 07/23/18   Biagio Borg, MD   Past Medical History:  Diagnosis Date  . ALLERGIC RHINITIS 07/07/2008  . CONTACT DERMATITIS 11/30/2009  . DIABETES MELLITUS, TYPE II 11/29/2006  . Dizziness and giddiness 04/26/2007  . HYPERLIPIDEMIA 01/07/2007  . HYPERTENSION 07/08/2007  . PLANTAR FASCIITIS, LEFT 08/05/2007  . Preventative health care 07/30/2010   Past Surgical History:  Procedure Laterality Date  . MOUTH SURGERY     teeth extraction  . TUBAL LIGATION     Family History  Problem Relation Age of Onset  . Stroke Mother   . Cancer Mother        lung and breast cancer  . Depression Mother   . Heart disease Mother  Attack, Aneurysm  . Hypertension Mother   . Varicose Veins Mother   . Raynaud syndrome Mother   . Hyperlipidemia Mother   . Glaucoma Mother   . Cancer Father        prostate cancer  . Diabetes Father   . Stroke Father        x3  . Hyperlipidemia Father   . Hypertension Father   . Heart disease Father        bypass surgery  . Thrombophilia Father   . Bipolar disorder Maternal Aunt   . Cancer Maternal Aunt        Ovarian  . Fibroids Maternal Aunt   . Diabetes Maternal Aunt   . Hypertension Maternal Uncle   . Diabetes Maternal Uncle   . Diabetes Paternal Aunt   . Hyperlipidemia Paternal Aunt   . Hypertension Paternal Aunt   . Fibroids Paternal Aunt   . Thyroid  disease Paternal Aunt   . Diabetes Paternal Uncle   . Stroke Paternal Uncle   . Thrombophilia Paternal Uncle   . Colon cancer Neg Hx   . Colon polyps Neg Hx    Social History   Socioeconomic History  . Marital status: Married    Spouse name: Not on file  . Number of children: 2  . Years of education: Not on file  . Highest education level: Not on file  Occupational History  . 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 Financial resource strain: Not on file  . Food insecurity    Worry: Not on file    Inability: Not on file  . Transportation needs    Medical: Not on file    Non-medical: Not on file  Tobacco Use  . Smoking status: Never Smoker  . Smokeless tobacco: Never Used  Substance and Sexual Activity  . Alcohol use: Yes    Alcohol/week: 0.0 standard drinks    Comment: 1-2 every 2-3 weeks  . Drug use: No  . Sexual activity: Yes    Comment: 1st intercourse 83 yo-5 partners  Lifestyle  . Physical activity    Days per week: Not on file    Minutes per session: Not on file  . Stress: Not on file  Relationships  . Social Herbalist on phone: Not on file    Gets together: Not on file    Attends religious service: Not on file    Active member of club or organization: Not on file    Attends meetings of clubs or organizations: Not on file    Relationship status: Not on file  . Intimate partner violence    Fear of current or ex partner: Not on file    Emotionally abused: Not on file    Physically abused: Not on file    Forced sexual activity: Not on file  Other Topics Concern  . Not on file  Social History Narrative  . Not on file   ROS-see HPI   + = positive Constitutional:    weight loss, night sweats, fevers, chills, fatigue, lassitude. HEENT:    headaches, difficulty swallowing, tooth/dental problems, sore throat,       sneezing, itching, ear ache, nasal congestion, post nasal drip, snoring CV:    chest pain, orthopnea,  PND, +swelling in lower extremities, anasarca,  dizziness, palpitations Resp:   shortness of breath with exertion or at rest.                productive cough,   non-productive cough, coughing up of blood.              change in color of mucus.  wheezing.   Skin:    rash or lesions. GI:  No-   heartburn, indigestion, abdominal pain, nausea, vomiting, diarrhea,                 change in bowel habits, loss of appetite GU: dysuria, change in color of urine, no urgency or frequency.   flank pain. MS:   joint pain, stiffness, decreased range of motion, back pain. Neuro-     nothing unusual Psych:  change in mood or affect.  depression or anxiety.   memory loss.  OBJ- Physical Exam General- Alert, Oriented, Affect-appropriate, Distress- none acute, + overweight Skin- rash-none, lesions- none, excoriation- none Lymphadenopathy- none Head- atraumatic            Eyes- Gross vision intact, PERRLA, conjunctivae and secretions clear            Ears- Hearing, canals-normal            Nose- Clear, no-Septal dev, mucus, polyps, erosion, perforation             Throat- Mallampati IV , mucosa clear , drainage- none, tonsils-, few missing teeth Neck- flexible , trachea midline, no stridor , thyroid nl, carotid no bruit Chest - symmetrical excursion , unlabored           Heart/CV- RRR , no murmur , no gallop  , no rub, nl s1 s2                           - JVD- none , edema- none, stasis changes- none, varices- none           Lung- clear to P&A, wheeze- none, cough- none , dullness-none, rub- none           Chest wall-  Abd-  Br/ Gen/ Rectal- Not done, not indicated Extrem- cyanosis- none, clubbing, none, atrophy- none, strength- nl Neuro- grossly intact to observation

## 2018-08-22 ENCOUNTER — Other Ambulatory Visit: Payer: Self-pay | Admitting: Internal Medicine

## 2018-08-22 NOTE — Assessment & Plan Note (Signed)
High probability she has OSA based on history and exam. Appropriate discussion of sleep hygiene, driving responsibility, OSA basics, testing and treatment options. Plan- sleep study

## 2018-09-11 ENCOUNTER — Ambulatory Visit: Payer: No Typology Code available for payment source

## 2018-09-11 ENCOUNTER — Other Ambulatory Visit: Payer: Self-pay

## 2018-09-11 ENCOUNTER — Other Ambulatory Visit (INDEPENDENT_AMBULATORY_CARE_PROVIDER_SITE_OTHER): Payer: No Typology Code available for payment source

## 2018-09-11 DIAGNOSIS — G4733 Obstructive sleep apnea (adult) (pediatric): Secondary | ICD-10-CM

## 2018-09-11 DIAGNOSIS — E119 Type 2 diabetes mellitus without complications: Secondary | ICD-10-CM | POA: Diagnosis not present

## 2018-09-11 LAB — HEPATIC FUNCTION PANEL
ALT: 16 U/L (ref 0–35)
AST: 16 U/L (ref 0–37)
Albumin: 4.1 g/dL (ref 3.5–5.2)
Alkaline Phosphatase: 52 U/L (ref 39–117)
Bilirubin, Direct: 0 mg/dL (ref 0.0–0.3)
Total Bilirubin: 0.4 mg/dL (ref 0.2–1.2)
Total Protein: 7.1 g/dL (ref 6.0–8.3)

## 2018-09-11 LAB — LIPID PANEL
Cholesterol: 142 mg/dL (ref 0–200)
HDL: 47.2 mg/dL (ref >39.00)
LDL Cholesterol: 65 mg/dL (ref 0–99)
NonHDL: 94.4
Total CHOL/HDL Ratio: 3
Triglycerides: 145 mg/dL (ref 0.0–149.0)
VLDL: 29 mg/dL (ref 0.0–40.0)

## 2018-09-11 LAB — CBC WITH DIFFERENTIAL/PLATELET
Basophils Absolute: 0 10*3/uL (ref 0.0–0.1)
Basophils Relative: 0.5 % (ref 0.0–3.0)
Eosinophils Absolute: 0.2 10*3/uL (ref 0.0–0.7)
Eosinophils Relative: 2.8 % (ref 0.0–5.0)
HCT: 40.4 % (ref 36.0–46.0)
Hemoglobin: 13.4 g/dL (ref 12.0–15.0)
Lymphocytes Relative: 25 % (ref 12.0–46.0)
Lymphs Abs: 1.6 10*3/uL (ref 0.7–4.0)
MCHC: 33.1 g/dL (ref 30.0–36.0)
MCV: 89.9 fl (ref 78.0–100.0)
Monocytes Absolute: 0.4 10*3/uL (ref 0.1–1.0)
Monocytes Relative: 5.9 % (ref 3.0–12.0)
Neutro Abs: 4.2 10*3/uL (ref 1.4–7.7)
Neutrophils Relative %: 65.8 % (ref 43.0–77.0)
Platelets: 221 10*3/uL (ref 150.0–400.0)
RBC: 4.5 Mil/uL (ref 3.87–5.11)
RDW: 14.6 % (ref 11.5–15.5)
WBC: 6.4 10*3/uL (ref 4.0–10.5)

## 2018-09-11 LAB — URINALYSIS, ROUTINE W REFLEX MICROSCOPIC
Hgb urine dipstick: NEGATIVE
Ketones, ur: NEGATIVE
Leukocytes,Ua: NEGATIVE
Nitrite: NEGATIVE
RBC / HPF: NONE SEEN (ref 0–?)
Specific Gravity, Urine: >=1.03 — AB (ref 1.000–1.030)
Total Protein, Urine: NEGATIVE
Urine Glucose: NEGATIVE
Urobilinogen, UA: 0.2 (ref 0.0–1.0)
pH: 5 (ref 5.0–8.0)

## 2018-09-11 LAB — BASIC METABOLIC PANEL
BUN: 14 mg/dL (ref 6–23)
CO2: 25 mEq/L (ref 19–32)
Calcium: 9.3 mg/dL (ref 8.4–10.5)
Chloride: 104 mEq/L (ref 96–112)
Creatinine, Ser: 0.72 mg/dL (ref 0.40–1.20)
GFR: 83.37 mL/min (ref >60.00)
Glucose, Bld: 147 mg/dL — ABNORMAL HIGH (ref 70–99)
Potassium: 4.4 mEq/L (ref 3.5–5.1)
Sodium: 139 mEq/L (ref 135–145)

## 2018-09-11 LAB — HEMOGLOBIN A1C: Hgb A1c MFr Bld: 6.1 % (ref 4.6–6.5)

## 2018-09-11 LAB — TSH: TSH: 2.22 u[IU]/mL (ref 0.35–4.50)

## 2018-09-11 LAB — MICROALBUMIN / CREATININE URINE RATIO
Creatinine,U: 282.5 mg/dL
Microalb Creat Ratio: 0.8 mg/g (ref 0.0–30.0)
Microalb, Ur: 2.2 mg/dL — ABNORMAL HIGH (ref 0.0–1.9)

## 2018-09-18 ENCOUNTER — Ambulatory Visit (INDEPENDENT_AMBULATORY_CARE_PROVIDER_SITE_OTHER): Payer: No Typology Code available for payment source | Admitting: Internal Medicine

## 2018-09-18 ENCOUNTER — Encounter: Payer: Self-pay | Admitting: Internal Medicine

## 2018-09-18 ENCOUNTER — Other Ambulatory Visit: Payer: Self-pay

## 2018-09-18 VITALS — BP 126/84 | HR 83 | Temp 98.5°F | Ht 62.0 in | Wt 205.0 lb

## 2018-09-18 DIAGNOSIS — Z0000 Encounter for general adult medical examination without abnormal findings: Secondary | ICD-10-CM

## 2018-09-18 DIAGNOSIS — G4733 Obstructive sleep apnea (adult) (pediatric): Secondary | ICD-10-CM

## 2018-09-18 DIAGNOSIS — E538 Deficiency of other specified B group vitamins: Secondary | ICD-10-CM

## 2018-09-18 DIAGNOSIS — E119 Type 2 diabetes mellitus without complications: Secondary | ICD-10-CM

## 2018-09-18 DIAGNOSIS — Z23 Encounter for immunization: Secondary | ICD-10-CM

## 2018-09-18 DIAGNOSIS — E611 Iron deficiency: Secondary | ICD-10-CM | POA: Diagnosis not present

## 2018-09-18 DIAGNOSIS — E559 Vitamin D deficiency, unspecified: Secondary | ICD-10-CM

## 2018-09-18 NOTE — Progress Notes (Signed)
Subjective:    Patient ID: Traci [name], female    DOB: [birth year], 58 y.o.   MRN: [id number]  HPI  Here for wellness and f/u;  Overall doing ok;  Pt denies Chest pain, worsening SOB, DOE, wheezing, orthopnea, PND, worsening LE edema, palpitations, dizziness or syncope.  Pt denies neurological change such as new headache, facial or extremity weakness.  Pt denies polydipsia, polyuria, or low sugar symptoms. Pt states overall good compliance with treatment and medications, good tolerability, and has been trying to follow appropriate diet.  Pt denies worsening depressive symptoms, suicidal ideation or panic. No fever, night sweats, wt loss, loss of appetite, or other constitutional symptoms.  Pt states good ability with ADL's, has low fall risk, home safety reviewed and adequate, no other significant changes in hearing or vision, and only occasionally active with exercise.  Due for Tdap and flu shots, and overdue for f/u colnoscopy Past Medical History:  Diagnosis Date  . ALLERGIC RHINITIS 07/07/2008  . CONTACT DERMATITIS 11/30/2009  . DIABETES MELLITUS, TYPE II 11/29/2006  . Dizziness and giddiness 04/26/2007  . HYPERLIPIDEMIA 01/07/2007  . HYPERTENSION 07/08/2007  . PLANTAR FASCIITIS, LEFT 08/05/2007  . Preventative health care 07/30/2010   Past Surgical History:  Procedure Laterality Date  . MOUTH SURGERY     teeth extraction  . TUBAL LIGATION      reports that she has never smoked. She has never used smokeless tobacco. She reports current alcohol use. She reports that she does not use drugs. family history includes Bipolar disorder in her maternal aunt; Cancer in her father, maternal aunt, and mother; Depression in her mother; Diabetes in her father, maternal aunt, maternal uncle, paternal aunt, and paternal uncle; Fibroids in her maternal aunt and paternal aunt; Glaucoma in her mother; Heart disease in her father and mother; Hyperlipidemia in her father, mother, and paternal aunt;  Hypertension in her father, maternal uncle, mother, and paternal aunt; Raynaud syndrome in her mother; Stroke in her father, mother, and paternal uncle; Thrombophilia in her father and paternal uncle; Thyroid disease in her paternal aunt; Varicose Veins in her mother. Allergies  Allergen Reactions  . Penicillins     REACTION: Rash   Current Outpatient Medications on File Prior to Visit  Medication Sig Dispense Refill  . ALPRAZolam (XANAX) 0.5 MG tablet 1-2 tab by mouth as needed with travel 8 tablet 0  . aspirin 81 MG tablet Take 81 mg by mouth daily.      . citalopram (CELEXA) 20 MG tablet Take 1 tablet by mouth once daily 30 tablet 0  . glipiZIDE (GLUCOTROL XL) 5 MG 24 hr tablet Take 1 tablet by mouth once daily with breakfast 30 tablet 0  . glucose blood test strip Use as directed once daily to check blood sugar.  Diagnosis code 250.00 100 each 11  . guaiFENesin (MUCINEX) 600 MG 12 hr tablet Take 2 tablets (1,200 mg total) by mouth 2 (two) times daily as needed. 60 tablet 1  . Lancets MISC Use as directed once daily to check blood sugar.  Diagnosis code 250.00 100 each 11  . lisinopril (ZESTRIL) 5 MG tablet Take 1 tablet by mouth once daily 30 tablet 0  . meclizine (ANTIVERT) 12.5 MG tablet Take 1 tablet (12.5 mg total) by mouth 3 (three) times daily as needed. 40 tablet 1  . metFORMIN (GLUCOPHAGE) 1000 MG tablet TAKE ONE TABLET BY MOUTH TWICE A DAY WITH MEALS 60 tablet 5  . pioglitazone (ACTOS) 45 MG tablet  Take 1 tablet by mouth once daily 90 tablet 0  . pravastatin (PRAVACHOL) 40 MG tablet Take 1 tablet by mouth once daily 90 tablet 0  . scopolamine (TRANSDERM-SCOP, 1.5 MG,) 1 MG/3DAYS Place 1 patch (1.5 mg total) onto the skin every 3 (three) days. 10 patch 12  . TRADJENTA 5 MG TABS tablet TAKE ONE TABLET BY MOUTH DAILY 30 tablet 11  . triamcinolone (NASACORT) 55 MCG/ACT AERO nasal inhaler Place 2 sprays into the nose daily. 1 Inhaler 12   No current facility-administered medications  on file prior to visit.    Review of Systems Constitutional: Negative for other unusual diaphoresis, sweats, appetite or weight changes HENT: Negative for other worsening hearing loss, ear pain, facial swelling, mouth sores or neck stiffness.   Eyes: Negative for other worsening pain, redness or other visual disturbance.  Respiratory: Negative for other stridor or swelling Cardiovascular: Negative for other palpitations or other chest pain  Gastrointestinal: Negative for worsening diarrhea or loose stools, blood in stool, distention or other pain Genitourinary: Negative for hematuria, flank pain or other change in urine volume.  Musculoskeletal: Negative for myalgias or other joint swelling.  Skin: Negative for other color change, or other wound or worsening drainage.  Neurological: Negative for other syncope or numbness. Hematological: Negative for other adenopathy or swelling Psychiatric/Behavioral: Negative for hallucinations, other worsening agitation, SI, self-injury, or new decreased concentration All other system neg per pt    Objective:   Physical Exam BP 126/84   Pulse 83   Temp 98.5 F (36.9 C) (Oral)   Ht 5\' 2"  (1.575 m)   Wt 205 lb (93 kg)   SpO2 98%   BMI 37.49 kg/m  VS noted,  Constitutional: Pt is oriented to person, place, and time. Appears well-developed and well-nourished, in no significant distress and comfortable Head: Normocephalic and atraumatic  Eyes: Conjunctivae and EOM are normal. Pupils are equal, round, and reactive to light Right Ear: External ear normal without discharge Left Ear: External ear normal without discharge Nose: Nose without discharge or deformity Mouth/Throat: Oropharynx is without other ulcerations and moist  Neck: Normal [name] of motion. Neck supple. No JVD present. No tracheal deviation present or significant neck LA or mass Cardiovascular: Normal rate, regular rhythm, normal heart sounds and intact distal pulses.   Pulmonary/Chest:  WOB normal and breath sounds without rales or wheezing  Abdominal: Soft. Bowel sounds are normal. NT. No HSM  Musculoskeletal: Normal [name] of motion. Exhibits no edema Lymphadenopathy: Has no other cervical adenopathy.  Neurological: Pt is alert and oriented to person, place, and time. Pt has normal reflexes. No cranial nerve deficit. Motor grossly intact, Gait intact Skin: Skin is warm and dry. No rash noted or new ulcerations Psychiatric:  Has normal mood and affect. Behavior is normal without agitation No other exam findings Lab Results  Component Value Date   WBC 6.4 09/11/2018   HGB 13.4 09/11/2018   HCT 40.4 09/11/2018   PLT 221.0 09/11/2018   GLUCOSE 147 (H) 09/11/2018   CHOL 142 09/11/2018   TRIG 145.0 09/11/2018   HDL 47.20 09/11/2018   LDLCALC 65 09/11/2018   ALT 16 09/11/2018   AST 16 09/11/2018   NA 139 09/11/2018   K 4.4 09/11/2018   CL 104 09/11/2018   CREATININE 0.72 09/11/2018   BUN 14 09/11/2018   CO2 25 09/11/2018   TSH 2.22 09/11/2018   HGBA1C 6.1 09/11/2018   MICROALBUR 2.2 (H) 09/11/2018  Assessment & Plan:

## 2018-09-18 NOTE — Assessment & Plan Note (Signed)
stable overall by history and exam, recent data reviewed with pt, and pt to continue medical treatment as before,  to f/u any worsening symptoms or concerns  

## 2018-09-18 NOTE — Patient Instructions (Addendum)
You had the flu shot today. And the Tdap tetanus shot  Please continue all other medications as before, and refills have been done if requested.  Please have the pharmacy call with any other refills you may need.  Please continue your efforts at being more active, low cholesterol diet, and weight control.  You are otherwise up to date with prevention measures today.  Please keep your appointments with your specialists as you may have planned  You will be contacted regarding the referral for: colonoscopy  Please return in 6 months, or sooner if needed, with Lab testing done 3-5 days before

## 2018-09-21 ENCOUNTER — Other Ambulatory Visit: Payer: Self-pay | Admitting: Internal Medicine

## 2018-10-03 ENCOUNTER — Telehealth: Payer: Self-pay | Admitting: Internal Medicine

## 2018-10-03 DIAGNOSIS — G4733 Obstructive sleep apnea (adult) (pediatric): Secondary | ICD-10-CM

## 2018-10-03 NOTE — Telephone Encounter (Signed)
Sleep study showed obstructive sleep apnea, averaging 9.8 apneas/ hour with drops in blood oxygen level. CPAP is the treatment most likely to work well. The alternative would be a fitted mouth piece.  Suggest we order new DME new CPAP auto 5-15, mask of choice humidifier, supplies, AirView/ card With return ov in 31-90 days.  If she prefers, we can refer her to Dr Ron Parker to learn about oral appliance therapy as an alternative to CPAP.

## 2018-10-03 NOTE — Telephone Encounter (Signed)
ATC pt, line went to voicemail. I have left a detailed message w/ CY's results and recommendations per pt's DPR. Will leave in my box to f/u on once pt calls back.

## 2018-10-03 NOTE — Telephone Encounter (Signed)
Call returned to patient, requesting results of HST.   CY please advise. HST scanned into epic. Thanks.

## 2018-10-06 NOTE — Telephone Encounter (Signed)
Call made to patient, confirmed DOB. Made aware of HST results per CY. Voiced understanding. Okay with placing order for cpap machine (order placed). Nothing further needed at this time.

## 2018-10-07 ENCOUNTER — Encounter: Payer: Self-pay | Admitting: Gynecology

## 2018-10-09 ENCOUNTER — Telehealth: Payer: Self-pay | Admitting: *Deleted

## 2018-10-09 NOTE — Telephone Encounter (Signed)
Patient reported she could not quarantine. Will attempt to schedule patient at a later date.

## 2018-10-09 NOTE — Telephone Encounter (Signed)
Left message to call and schedule hospital colon with Dr. Loletha Carrow on 10/22.   Awaiting return call.

## 2018-10-18 ENCOUNTER — Other Ambulatory Visit: Payer: Self-pay | Admitting: Internal Medicine

## 2018-10-20 ENCOUNTER — Other Ambulatory Visit: Payer: Self-pay | Admitting: Internal Medicine

## 2018-10-28 ENCOUNTER — Other Ambulatory Visit: Payer: Self-pay | Admitting: Internal Medicine

## 2018-11-06 ENCOUNTER — Telehealth: Payer: Self-pay | Admitting: *Deleted

## 2018-11-06 ENCOUNTER — Encounter: Payer: Self-pay | Admitting: *Deleted

## 2018-11-06 NOTE — Telephone Encounter (Signed)
Attempted to call the patient a second time to schedule the patient for her hospital recall colonoscopy. Unable to leave message.

## 2018-11-06 NOTE — Telephone Encounter (Signed)
Attempted to call the patient to schedule the patient for her recall colonoscopy. The patients mailbox is full. Unable to LM. Sent the patient a message via [place].

## 2018-11-07 NOTE — Telephone Encounter (Signed)
Called the patient and told the patient the triage nurses are not privy to pricing information and to call her insurance company to determine if it would be economically better if she scheduled her procedure in December or the early part of 2021.

## 2018-12-01 ENCOUNTER — Encounter: Payer: Self-pay | Admitting: Internal Medicine

## 2018-12-01 ENCOUNTER — Other Ambulatory Visit: Payer: Self-pay

## 2018-12-01 ENCOUNTER — Ambulatory Visit (INDEPENDENT_AMBULATORY_CARE_PROVIDER_SITE_OTHER): Payer: No Typology Code available for payment source | Admitting: Internal Medicine

## 2018-12-01 DIAGNOSIS — G4733 Obstructive sleep apnea (adult) (pediatric): Secondary | ICD-10-CM | POA: Diagnosis not present

## 2018-12-01 DIAGNOSIS — Z9989 Dependence on other enabling machines and devices: Secondary | ICD-10-CM | POA: Diagnosis not present

## 2018-12-01 NOTE — Patient Instructions (Signed)
Order- DME [employer]- continue CPAP auto 5-15, mask of choice, humidifier, supplies, AirView/ card  Please call if we can help

## 2018-12-01 NOTE — Progress Notes (Signed)
08/21/2018- 78 yoF never smoker Referred by Dr. Jenny [name] (PCP) for daytime somnolence. pt reports occasionally waking up in the middle of the night Medical problem list includes HBP, Allergic Rhinitis, DM, Hyperlipidemia,  Covid swab Neg 07/23/18  Body weight today 201 lbs Epworth score 6        Works as Surveyor, [employer]. Med list includes alprazolam 0.5 mg, Celexa, Meclizine, TransDerm Scop Husband tells her of loud snoring, witnessed apneas. Occ wakes gasping. Parents snored. Unrested, sleepy around 11 AM. No sleep meds. 1-2 cups coffee. Alprazolam only for airplane travel. Occasional naps with grandchildren. No parasomnias- rare sleep talking.  Denies ENT surgery, lung/ heart or CNS problem.  12/01/2018 Virtual Visit via Telephone Note  I connected with Traci [name] on 12/01/18 at  3:30 PM EST by telephone and verified that I am speaking with the correct person using two identifiers.  Location: Patient: H Provider: O   I discussed the limitations, risks, security and privacy concerns of performing an evaluation and management service by telephone and the availability of in person appointments. I also discussed with the patient that there may be a patient responsible charge related to this service. The patient expressed understanding and agreed to proceed.   History of Present Illness: 77 yoF never smoker followed for OSA, complicated by HBP, Allergic Rhinitis, DM, Hyperlipidemia, Family tells her she is not snoring with CPAP and she sleeps better. Still getting used to CPAP mask and headgear- discussed.  CPAP auto 5-15/ [employer]  Observations/Objective: HST 09/11/2018- AHI 9.8/ hr, desaturation to 82%, body weight 201 lbs Download compliance 93%, AHI  11.5/ hr   Assessment and Plan: OSA- benefits. May need mask fitting/ desensitization. Continue 5-15 Rhinitis- not interfering with CPAP at this time of year.   Follow Up Instructions: 4 months   I discussed the assessment and treatment  plan with the patient. The patient was provided an opportunity to ask questions and all were answered. The patient agreed with the plan and demonstrated an understanding of the instructions.   The patient was advised to call back or seek an in-person evaluation if the symptoms worsen or if the condition fails to improve as anticipated.  I provided 18 minutes of non-face-to-face time during this encounter.   Baird Lyons, MD     ROS-see HPI   + = positive Constitutional:    weight loss, night sweats, fevers, chills, fatigue, lassitude. HEENT:    headaches, difficulty swallowing, tooth/dental problems, sore throat,       sneezing, itching, ear ache, nasal congestion, post nasal drip, snoring CV:    chest pain, orthopnea, PND, +swelling in lower extremities, anasarca,                                  dizziness, palpitations Resp:   shortness of breath with exertion or at rest.                productive cough,   non-productive cough, coughing up of blood.              change in color of mucus.  wheezing.   Skin:    rash or lesions. GI:  No-   heartburn, indigestion, abdominal pain, nausea, vomiting, diarrhea,                 change in bowel habits, loss of appetite GU: dysuria, change in color of urine, no urgency or frequency.  flank pain. MS:   joint pain, stiffness, decreased [name] of motion, back pain. Neuro-     nothing unusual Psych:  change in mood or affect.  depression or anxiety.   memory loss.  OBJ- Physical Exam General- Alert, Oriented, Affect-appropriate, Distress- none acute, + overweight Skin- rash-none, lesions- none, excoriation- none Lymphadenopathy- none Head- atraumatic            Eyes- Gross vision intact, PERRLA, conjunctivae and secretions clear            Ears- Hearing, canals-normal            Nose- Clear, no-Septal dev, mucus, polyps, erosion, perforation             Throat- Mallampati IV , mucosa clear , drainage- none, tonsils-, few missing teeth Neck-  flexible , trachea midline, no stridor , thyroid nl, carotid no bruit Chest - symmetrical excursion , unlabored           Heart/CV- RRR , no murmur , no gallop  , no rub, nl s1 s2                           - JVD- none , edema- none, stasis changes- none, varices- none           Lung- clear to P&A, wheeze- none, cough- none , dullness-none, rub- none           Chest wall-  Abd-  Br/ Gen/ Rectal- Not done, not indicated Extrem- cyanosis- none, clubbing, none, atrophy- none, strength- nl Neuro- grossly intact to observation

## 2018-12-10 ENCOUNTER — Encounter: Payer: Self-pay | Admitting: Internal Medicine

## 2018-12-17 ENCOUNTER — Telehealth: Payer: Self-pay | Admitting: *Deleted

## 2018-12-17 ENCOUNTER — Other Ambulatory Visit: Payer: Self-pay | Admitting: *Deleted

## 2018-12-17 ENCOUNTER — Encounter: Payer: Self-pay | Admitting: *Deleted

## 2018-12-17 DIAGNOSIS — D509 Iron deficiency anemia, unspecified: Secondary | ICD-10-CM

## 2018-12-17 NOTE — Telephone Encounter (Signed)
Patient scheduled for the following:    01/23/2019 at 4:00 pm pre-op nurse visit  01/29/2019 at 3:00 pm COVID screening [facility]  02/02/2019 at 9:30 am colon at [facility] (Case ID: [id number]  Patient sent MyChart message. Letter also printed and mailed.

## 2018-12-17 NOTE — Telephone Encounter (Signed)
Called patient, left detailed message asking if she wanted to be scheduled for her pre op visit with the nurse, COVID-19 screening and colon at [facility] on 02/02/2019 with Dr. Loletha Carrow. Awaiting return call.

## 2019-01-09 HISTORY — PX: COLONOSCOPY: SHX174

## 2019-01-16 ENCOUNTER — Telehealth: Payer: Self-pay | Admitting: Gastroenterology

## 2019-01-16 NOTE — Telephone Encounter (Signed)
Dr. Gessner please see note below and advise. 

## 2019-01-16 NOTE — Telephone Encounter (Signed)
I understood from Dr. Carlean Purl (med director of hospital endoscopy lab) that these restrictions take effect on Jan 25th, to include procedures scheduled for that day.  Please clarify this with Dr. Carlean Purl and [facility] nurse manager.  If the restrictions start after the 25th, then please keep this and all my scopes on schedule for the 25th

## 2019-01-16 NOTE — Telephone Encounter (Signed)
Dr. Loletha Carrow per Barbera Setters in our meeting this am we are no longer able to schedule screening procedures after the 25th. Just wanted to clarify with you regarding cancelling this procedure.

## 2019-01-16 NOTE — Telephone Encounter (Signed)
This patient is currently on my schedule for a surveillance colonoscopy on Monday, January 25.  New restrictions have just been placed on elective hospital outpatient endoscopic and surgical procedures due to Covid surge.  We do not know how long these restrictions will last, but most likely a few months.  I originally recommended she have her procedure in the hospital endoscopy lab because I was the recommendation of Dr. Deatra Ina after her last colonoscopy in 2016.  He thought that perhaps more complex polypectomy might be required at that time.  However, that will not necessarily be the case, and I do not wish her procedure to be delayed any further since she is significantly overdue for this surveillance exam.  If she is agreeable to it, I would like her to have her procedure in the [place] anytime soon.

## 2019-01-16 NOTE — Telephone Encounter (Signed)
The changes are effective 1/25 so last day to do a case like this would be 1/23

## 2019-01-19 ENCOUNTER — Telehealth: Payer: Self-pay | Admitting: *Deleted

## 2019-01-19 NOTE — Telephone Encounter (Signed)
Spoke to the patient whose case has been moved to the [facility] from [employer]. She has been scheduled for the following:   01/23/19 previsit with nurse at 4:00 pm  02/03/19 COVID screening at 8:20 am  02/05/19 colonoscopy at 9:00 am   Patient aware of all changes and new appt times.

## 2019-01-19 NOTE — Telephone Encounter (Signed)
Patient has been rescheduled in the [place].

## 2019-01-19 NOTE — Telephone Encounter (Signed)
See notes below, looks like the pt will have to be cancelled at the hospital.

## 2019-01-19 NOTE — Telephone Encounter (Signed)
-----   Message from Doran Stabler, MD sent at 01/19/2019  1:38 PM EST ----- The January 25 case needs to be canceled.  I would like it to be rescheduled in the [place].  Thanks. ----- Message ----- From: Dalene Seltzer, RN Sent: 01/19/2019   1:34 PM EST To: Doran Stabler, MD  I scheduled the pre-visit a while ago when I scheduled her for the hospital procedure. I schedule all of the patient's for a pre-visits when/if they have [place] or [facility] procedures. She is currently not scheduled in the [place]. She is still scheduled on 1/25 at the hospital. Did you want me to cancel her hospital procedure and find a date to move her to in the [place]? ----- Message ----- From: Doran Stabler, MD Sent: 01/19/2019   1:26 PM EST To: Dalene Seltzer, RN  It may already be in the works, since she has a Media planner with [place] on 1/15 ----- Message ----- From: Dalene Seltzer, RN Sent: 01/19/2019   9:42 AM EST To: Doran Stabler, MD  Dr. Loletha Carrow,   I back today and wanted to follow up about this patient. Did you want me to move her to the [facility] for you? I read the long thread between you, Vaughan Basta and Dr. Carlean Purl but it looks like that request of yours didn't happen. Is that what you wanted?  Traci [name]

## 2019-01-23 ENCOUNTER — Other Ambulatory Visit: Payer: Self-pay

## 2019-01-23 ENCOUNTER — Ambulatory Visit (AMBULATORY_SURGERY_CENTER): Payer: Self-pay | Admitting: *Deleted

## 2019-01-23 VITALS — Temp 97.5°F | Ht 61.0 in | Wt 203.2 lb

## 2019-01-23 DIAGNOSIS — Z86010 Personal history of colonic polyps: Secondary | ICD-10-CM

## 2019-01-23 DIAGNOSIS — Z01818 Encounter for other preprocedural examination: Secondary | ICD-10-CM

## 2019-01-23 MED ORDER — SUPREP BOWEL PREP KIT 17.5-3.13-1.6 GM/177ML PO SOLN: 1.0000 | Freq: Once | ORAL | 0 refills | Status: AC

## 2019-01-23 NOTE — Progress Notes (Signed)
Patient denies any allergies to egg or soy products. Patient denies complications with anesthesia/sedation. OSA - uses CPAP nightly. Patient denies oxygen use at home and denies diet medications. Emmi instructions for colonoscopy/endoscopy explained and given to patient.  Suprep coupon was used.

## 2019-01-29 ENCOUNTER — Other Ambulatory Visit (HOSPITAL_COMMUNITY): Payer: No Typology Code available for payment source

## 2019-01-30 ENCOUNTER — Encounter: Payer: Self-pay | Admitting: Gastroenterology

## 2019-02-02 ENCOUNTER — Ambulatory Visit (HOSPITAL_COMMUNITY): Admit: 2019-02-02 | Payer: No Typology Code available for payment source | Admitting: Gastroenterology

## 2019-02-02 ENCOUNTER — Other Ambulatory Visit: Payer: Self-pay | Admitting: Gastroenterology

## 2019-02-02 ENCOUNTER — Ambulatory Visit (INDEPENDENT_AMBULATORY_CARE_PROVIDER_SITE_OTHER): Payer: No Typology Code available for payment source

## 2019-02-02 ENCOUNTER — Encounter (HOSPITAL_COMMUNITY): Payer: Self-pay

## 2019-02-02 DIAGNOSIS — Z1159 Encounter for screening for other viral diseases: Secondary | ICD-10-CM

## 2019-02-02 SURGERY — COLONOSCOPY WITH PROPOFOL
Anesthesia: Monitor Anesthesia Care

## 2019-02-03 ENCOUNTER — Encounter: Payer: Self-pay | Admitting: Internal Medicine

## 2019-02-03 LAB — SARS CORONAVIRUS 2 (TAT 6-24 HRS): SARS Coronavirus 2: NEGATIVE

## 2019-02-04 ENCOUNTER — Telehealth: Payer: Self-pay | Admitting: *Deleted

## 2019-02-04 NOTE — Telephone Encounter (Signed)
Called patient, left message to reschedule [facility] procedure. Will await return call.

## 2019-02-04 NOTE — Telephone Encounter (Signed)
Error. Patient already scheduled in [place].

## 2019-02-05 ENCOUNTER — Ambulatory Visit (AMBULATORY_SURGERY_CENTER): Payer: No Typology Code available for payment source | Admitting: Gastroenterology

## 2019-02-05 ENCOUNTER — Encounter: Payer: Self-pay | Admitting: Gastroenterology

## 2019-02-05 ENCOUNTER — Other Ambulatory Visit: Payer: Self-pay

## 2019-02-05 VITALS — BP 128/50 | HR 83 | Temp 97.5°F | Resp 16 | Ht 61.0 in | Wt 203.2 lb

## 2019-02-05 DIAGNOSIS — D120 Benign neoplasm of cecum: Secondary | ICD-10-CM | POA: Diagnosis not present

## 2019-02-05 DIAGNOSIS — D122 Benign neoplasm of ascending colon: Secondary | ICD-10-CM

## 2019-02-05 DIAGNOSIS — Z86010 Personal history of colonic polyps: Secondary | ICD-10-CM

## 2019-02-05 MED ORDER — SODIUM CHLORIDE 0.9 % IV SOLN: 500.0000 mL | INTRAVENOUS | Status: DC

## 2019-02-05 NOTE — Progress Notes (Signed)
VS per Jari Pigg per Hiram Comber

## 2019-02-05 NOTE — Op Note (Signed)
[facility] Patient Name: Traci [name] Procedure Date: 02/05/2019 9:16 AM MRN: [id number] Endoscopist: Mallie Mussel L. Loletha Carrow , MD Age: 59 Referring MD:  Date of Birth: Apr 03, 1960 Gender: Female Account #: [id number] Procedure:                Colonoscopy Indications:              Surveillance: Personal history of adenomatous                            polyps on last colonoscopy > 3 years ago (2cm cecal                            adenoma removed by EMR 08/2014) Medicines:                Monitored Anesthesia Care Procedure:                Pre-Anesthesia Assessment:                           - Prior to the procedure, a History and Physical                            was performed, and patient medications and                            allergies were reviewed. The patient's tolerance of                            previous anesthesia was also reviewed. The risks                            and benefits of the procedure and the sedation                            options and risks were discussed with the patient.                            All questions were answered, and informed consent                            was obtained. Prior Anticoagulants: The patient has                            taken no previous anticoagulant or antiplatelet                            agents except for aspirin. ASA Grade Assessment: II                            - A patient with mild systemic disease. After                            reviewing the risks and benefits, the patient was  deemed in satisfactory condition to undergo the                            procedure.                           After obtaining informed consent, the colonoscope                            was passed under direct vision. Throughout the                            procedure, the patient's blood pressure, pulse, and                            oxygen saturations were monitored continuously. The                 Colonoscope was introduced through the anus and                            advanced to the the cecum, identified by                            appendiceal orifice and ileocecal valve. The                            colonoscopy was performed without difficulty. The                            patient tolerated the procedure well. The quality                            of the bowel preparation was good. The ileocecal                            valve, appendiceal orifice, and rectum were                            photographed. The quality of the bowel preparation                            was evaluated using the BBPS [facility] Bowel                            Preparation Scale) with scores of: Right Colon = 2,                            Transverse Colon = 2 and Left Colon = 2. The total                            BBPS score equals 6. The bowel preparation used was                            SUPREP. Scope  In: 9:19:53 AM Scope Out: 9:52:24 AM Scope Withdrawal Time: 0 hours 31 minutes 7 seconds  Total Procedure Duration: 0 hours 32 minutes 31 seconds  Findings:                 The perianal and digital rectal examinations were                            normal.                           A 12-15 mm polyp was found in the cecum. The polyp                            was sessile. The polyp was removed with a cold                            snare. Resection and retrieval were complete after                            removal of two edge pieces with col biopsy as well.                           A 15-18 mm polyp was found in the ascending colon.                            The polyp was sessile, and draped over a fold. The                            polyp was removed with a piecemeal technique using                            a hot snare. Resection and retrieval were complete                            after removal of several edges pieces with cold                            biopsy  forceps.                           The exam was otherwise without abnormality on                            direct and retroflexion views. Complications:            No immediate complications. Estimated Blood Loss:     Estimated blood loss was minimal. Impression:               - One 12-15 mm polyp in the cecum, removed with a                            cold snare. Resected and retrieved.                           -  One 15-18 mm polyp in the ascending colon,                            removed piecemeal using a hot snare. Resected and                            retrieved.                           - The examination was otherwise normal on direct                            and retroflexion views. Recommendation:           - Patient has a contact number available for                            emergencies. The signs and symptoms of potential                            delayed complications were discussed with the                            patient. Return to normal activities tomorrow.                            Written discharge instructions were provided to the                            patient.                           - Continue present medications.                           - No aspirin, ibuprofen, naproxen, or other                            non-steroidal anti-inflammatory drugs for 5 days                            after polyp removal.                           - Await pathology results.                           - Repeat colonoscopy in 1 year for surveillance. Skylin Kennerson L. Loletha Carrow, MD 02/05/2019 10:00:40 AM This report has been signed electronically.

## 2019-02-05 NOTE — Progress Notes (Signed)
Called to room to assist during endoscopic procedure.  Patient ID and intended procedure confirmed with present staff. Received instructions for my participation in the procedure from the performing physician.  

## 2019-02-05 NOTE — Patient Instructions (Signed)
No aspirin, ibuprofen, naproxen or other antiinflammatories for 5 days after your procedure.  You will need another colonoscopy in 1 year.  Please, read all of the handouts given to you by your recovery room nurse.  YOU HAD AN ENDOSCOPIC PROCEDURE TODAY AT [facility]:   Refer to the procedure report that was given to you for any specific questions about what was found during the examination.  If the procedure report does not answer your questions, please call your gastroenterologist to clarify.  If you requested that your care partner not be given the details of your procedure findings, then the procedure report has been included in a sealed envelope for you to review at your convenience later.  YOU SHOULD EXPECT: Some feelings of bloating in the abdomen. Passage of more gas than usual.  Walking can help get rid of the air that was put into your GI tract during the procedure and reduce the bloating. If you had a lower endoscopy (such as a colonoscopy or flexible sigmoidoscopy) you may notice spotting of blood in your stool or on the toilet paper. If you underwent a bowel prep for your procedure, you may not have a normal bowel movement for a few days.  Please Note:  You might notice some irritation and congestion in your nose or some drainage.  This is from the oxygen used during your procedure.  There is no need for concern and it should clear up in a day or so.  SYMPTOMS TO REPORT IMMEDIATELY:   Following lower endoscopy (colonoscopy or flexible sigmoidoscopy):  Excessive amounts of blood in the stool  Significant tenderness or worsening of abdominal pains  Swelling of the abdomen that is new, acute  Fever of 100F or higher   For urgent or emergent issues, a gastroenterologist can be reached at any hour by calling [phone].   DIET:  We do recommend a small meal at first, but then you may proceed to your regular diet.  Drink plenty of fluids but you should avoid  alcoholic beverages for 24 hours.  ACTIVITY:  You should plan to take it easy for the rest of today and you should NOT DRIVE or use heavy machinery until tomorrow (because of the sedation medicines used during the test).    FOLLOW UP: Our staff will call the number listed on your records 48-72 hours following your procedure to check on you and address any questions or concerns that you may have regarding the information given to you following your procedure. If we do not reach you, we will leave a message.  We will attempt to reach you two times.  During this call, we will ask if you have developed any symptoms of COVID 19. If you develop any symptoms (ie: fever, flu-like symptoms, shortness of breath, cough etc.) before then, please call [phone].  If you test positive for Covid 19 in the 2 weeks post procedure, please call and report this information to [place].    If any biopsies were taken you will be contacted by phone or by letter within the next 1-3 weeks.  Please call us at [phone] if you have not heard about the biopsies in 3 weeks.    SIGNATURES/CONFIDENTIALITY: You and/or your care partner have signed paperwork which will be entered into your electronic medical record.  These signatures attest to the fact that that the information above on your After Visit Summary has been reviewed and is understood.  Full responsibility of the  confidentiality of this discharge information lies with you and/or your care-partner. 

## 2019-02-05 NOTE — Progress Notes (Signed)
Pt's states no medical or surgical changes since previsit or office visit. 

## 2019-02-05 NOTE — Progress Notes (Signed)
Report given to PACU, vss 

## 2019-02-09 ENCOUNTER — Encounter: Payer: Self-pay | Admitting: Gastroenterology

## 2019-02-09 ENCOUNTER — Telehealth: Payer: Self-pay

## 2019-02-09 NOTE — Telephone Encounter (Signed)
  Follow up Call-  Call back number 02/05/2019  Post procedure Call Back phone  # [phone]  Permission to leave phone message Yes  Some recent data might be hidden     Patient questions:  Do you have a fever, pain , or abdominal swelling? No. Pain Score  0 *  Have you tolerated food without any problems? Yes.    Have you been able to return to your normal activities? Yes.    Do you have any questions about your discharge instructions: Diet   No. Medications  No. Follow up visit  No.  Do you have questions or concerns about your Care? No.  Actions: * If pain score is 4 or above: No action needed, pain <4.  1. Have you developed a fever since your procedure? no  2.   Have you had an respiratory symptoms (SOB or cough) since your procedure? no  3.   Have you tested positive for COVID 19 since your procedure no  4.   Have you had any family members/close contacts diagnosed with the COVID 19 since your procedure?  no   If yes to any of these questions please route to Joylene John, RN and Alphonsa Gin, Therapist, sports.

## 2019-03-13 ENCOUNTER — Other Ambulatory Visit (INDEPENDENT_AMBULATORY_CARE_PROVIDER_SITE_OTHER): Payer: No Typology Code available for payment source

## 2019-03-13 DIAGNOSIS — E559 Vitamin D deficiency, unspecified: Secondary | ICD-10-CM

## 2019-03-13 DIAGNOSIS — E611 Iron deficiency: Secondary | ICD-10-CM | POA: Diagnosis not present

## 2019-03-13 DIAGNOSIS — E538 Deficiency of other specified B group vitamins: Secondary | ICD-10-CM

## 2019-03-13 DIAGNOSIS — E119 Type 2 diabetes mellitus without complications: Secondary | ICD-10-CM | POA: Diagnosis not present

## 2019-03-13 LAB — LIPID PANEL
Cholesterol: 109 mg/dL (ref 0–200)
HDL: 46.6 mg/dL (ref >39.00)
LDL Cholesterol: 48 mg/dL (ref 0–99)
NonHDL: 62.21
Total CHOL/HDL Ratio: 2
Triglycerides: 71 mg/dL (ref 0.0–149.0)
VLDL: 14.2 mg/dL (ref 0.0–40.0)

## 2019-03-13 LAB — BASIC METABOLIC PANEL
BUN: 20 mg/dL (ref 6–23)
CO2: 26 mEq/L (ref 19–32)
Calcium: 9.7 mg/dL (ref 8.4–10.5)
Chloride: 103 mEq/L (ref 96–112)
Creatinine, Ser: 0.88 mg/dL (ref 0.40–1.20)
GFR: 66.02 mL/min (ref >60.00)
Glucose, Bld: 93 mg/dL (ref 70–99)
Potassium: 4.5 mEq/L (ref 3.5–5.1)
Sodium: 140 mEq/L (ref 135–145)

## 2019-03-13 LAB — HEPATIC FUNCTION PANEL
ALT: 18 U/L (ref 0–35)
AST: 19 U/L (ref 0–37)
Albumin: 4 g/dL (ref 3.5–5.2)
Alkaline Phosphatase: 52 U/L (ref 39–117)
Bilirubin, Direct: 0.1 mg/dL (ref 0.0–0.3)
Total Bilirubin: 0.4 mg/dL (ref 0.2–1.2)
Total Protein: 7.2 g/dL (ref 6.0–8.3)

## 2019-03-13 LAB — HEMOGLOBIN A1C: Hgb A1c MFr Bld: 5.3 % (ref 4.6–6.5)

## 2019-03-13 LAB — IBC PANEL
Iron: 24 ug/dL — ABNORMAL LOW (ref 42–145)
Saturation Ratios: 6.5 % — ABNORMAL LOW (ref 20.0–50.0)
Transferrin: 263 mg/dL (ref 212.0–360.0)

## 2019-03-13 LAB — VITAMIN D 25 HYDROXY (VIT D DEFICIENCY, FRACTURES): VITD: 35.86 ng/mL (ref 30.00–100.00)

## 2019-03-13 LAB — VITAMIN B12: Vitamin B-12: 398 pg/mL (ref 211–911)

## 2019-03-15 ENCOUNTER — Ambulatory Visit: Payer: No Typology Code available for payment source | Attending: Internal Medicine

## 2019-03-15 ENCOUNTER — Ambulatory Visit: Payer: No Typology Code available for payment source

## 2019-03-15 DIAGNOSIS — Z23 Encounter for immunization: Secondary | ICD-10-CM | POA: Insufficient documentation

## 2019-03-15 NOTE — Progress Notes (Signed)
   Covid-19 Vaccination Clinic  Name:  Traci [name]    MRN: [id number] DOB: [birth year]  03/15/2019  Traci [name] was observed post Covid-19 immunization for 15 minutes without incident. She was provided with Vaccine Information Sheet and instruction to access the V-Safe system.   Traci [name] was instructed to call 911 with any severe reactions post vaccine: Marland Kitchen Difficulty breathing  . Swelling of face and throat  . A fast heartbeat  . A bad rash all over body  . Dizziness and weakness   Immunizations Administered    Name Date Dose VIS Date Route   Pfizer COVID-19 Vaccine 03/15/2019  9:00 AM 0.3 mL 12/19/2018 Intramuscular   Manufacturer: [place]   Lot: [id number]   [place]: [id number]

## 2019-03-19 ENCOUNTER — Encounter: Payer: Self-pay | Admitting: Internal Medicine

## 2019-03-19 ENCOUNTER — Other Ambulatory Visit: Payer: Self-pay

## 2019-03-19 ENCOUNTER — Ambulatory Visit: Payer: No Typology Code available for payment source | Admitting: Internal Medicine

## 2019-03-19 VITALS — BP 110/64 | HR 74 | Temp 97.9°F | Ht 61.0 in | Wt 188.4 lb

## 2019-03-19 DIAGNOSIS — E119 Type 2 diabetes mellitus without complications: Secondary | ICD-10-CM

## 2019-03-19 DIAGNOSIS — E785 Hyperlipidemia, unspecified: Secondary | ICD-10-CM

## 2019-03-19 DIAGNOSIS — Z0000 Encounter for general adult medical examination without abnormal findings: Secondary | ICD-10-CM

## 2019-03-19 DIAGNOSIS — E611 Iron deficiency: Secondary | ICD-10-CM

## 2019-03-19 MED ORDER — TRULICITY 0.75 MG/0.5ML ~~LOC~~ SOAJ: 0.7500 mg | SUBCUTANEOUS | 3 refills | Status: DC

## 2019-03-19 MED ORDER — POLYSACCHARIDE IRON COMPLEX 150 MG PO CAPS: 150.0000 mg | ORAL_CAPSULE | Freq: Every day | ORAL | 1 refills | Status: DC

## 2019-03-19 NOTE — Patient Instructions (Addendum)
Ok to stop the glipizide and the tradjenta  Please take all new medication as prescribed - the trulicity, and the iron pill 1 per day  Please call in 2 wks for the higher dose of trulicity if you have no stomach upset with it  Please continue all other medications as before, and refills have been done if requested.  Please have the pharmacy call with any other refills you may need.  Please continue your efforts at being more active, low cholesterol diet, and weight control.  Please keep your appointments with your specialists as you may have planned  You will be contacted regarding the referral for: Gastroenterology  You will be contacted regarding the referral for: Cardiac CT score  Please make an Appointment to return in 6 months, or sooner if needed, with labs prior at the [facility]

## 2019-03-19 NOTE — Progress Notes (Signed)
Subjective:    Patient ID: Traci [name], female    DOB: 06-13-1960, 59 y.o.   MRN: [id number]  HPI  Here to f/u; overall doing ok,  Pt denies chest pain, increasing sob or doe, wheezing, orthopnea, PND, increased LE swelling, palpitations, dizziness or syncope.  Pt denies new neurological symptoms such as new headache, or facial or extremity weakness or numbness.  Pt denies polydipsia, polyuria, or low sugar episode.  Pt states overall good compliance with meds, mostly trying to follow appropriate diet, with wt overall stable,  but little exercise however. Wt Readings from Last 3 Encounters:  03/19/19 188 lb 6.4 oz (85.5 kg)  02/05/19 203 lb 3.2 oz (92.2 kg)  01/23/19 203 lb 3.2 oz (92.2 kg)   Past Medical History:  Diagnosis Date  . ALLERGIC RHINITIS 07/07/2008  . Allergy   . Anxiety   . CONTACT DERMATITIS 11/30/2009   resolved  . Depression   . DIABETES MELLITUS, TYPE II 11/29/2006  . Dizziness and giddiness 04/26/2007  . HYPERLIPIDEMIA 01/07/2007  . HYPERTENSION 07/08/2007  . PLANTAR FASCIITIS, LEFT 08/05/2007   resolved  . Preventative health care 07/30/2010  . Sleep apnea    CPAP nightly   Past Surgical History:  Procedure Laterality Date  . COLONOSCOPY  2016   kalplan - polyps  . MOUTH SURGERY     teeth extraction  . TUBAL LIGATION    . WISDOM TOOTH EXTRACTION      reports that she has never smoked. She has never used smokeless tobacco. She reports previous alcohol use. She reports that she does not use drugs. family history includes Bipolar disorder in her maternal aunt; Cancer in her father, maternal aunt, and mother; Depression in her mother; Diabetes in her father, maternal aunt, maternal uncle, paternal aunt, and paternal uncle; Fibroids in her maternal aunt and paternal aunt; Glaucoma in her mother; Heart disease in her father and mother; Hyperlipidemia in her father, mother, and paternal aunt; Hypertension in her father, maternal uncle, mother, and paternal aunt;  Raynaud syndrome in her mother; Stroke in her father, mother, and paternal uncle; Thrombophilia in her father and paternal uncle; Thyroid disease in her paternal aunt; Varicose Veins in her mother. Allergies  Allergen Reactions  . Penicillins     REACTION: Rash   Current Outpatient Medications on File Prior to Visit  Medication Sig Dispense Refill  . ALPRAZolam (XANAX) 0.5 MG tablet 1-2 tab by mouth as needed with travel 8 tablet 0  . aspirin 81 MG tablet Take 81 mg by mouth daily.      . citalopram (CELEXA) 20 MG tablet Take 1 tablet by mouth once daily 30 tablet 5  . glucose blood test strip Use as directed once daily to check blood sugar.  Diagnosis code 250.00 100 each 11  . Lancets MISC Use as directed once daily to check blood sugar.  Diagnosis code 250.00 100 each 11  . lisinopril (ZESTRIL) 5 MG tablet Take 1 tablet by mouth once daily 30 tablet 5  . meclizine (ANTIVERT) 12.5 MG tablet Take 1 tablet (12.5 mg total) by mouth 3 (three) times daily as needed. 40 tablet 1  . metFORMIN (GLUCOPHAGE) 1000 MG tablet TAKE ONE TABLET BY MOUTH TWICE A DAY WITH MEALS 60 tablet 4  . pioglitazone (ACTOS) 45 MG tablet Take 1 tablet by mouth once daily 90 tablet 1  . pravastatin (PRAVACHOL) 40 MG tablet Take 1 tablet by mouth once daily 90 tablet 1  . scopolamine (TRANSDERM-SCOP,  1.5 MG,) 1 MG/3DAYS Place 1 patch (1.5 mg total) onto the skin every 3 (three) days. 10 patch 12  . triamcinolone (NASACORT) 55 MCG/ACT AERO nasal inhaler Place 2 sprays into the nose daily. 1 Inhaler 12   No current facility-administered medications on file prior to visit.   Review of Systems All otherwise neg per pt     Objective:   Physical Exam BP 110/64   Pulse 74   Temp 97.9 F (36.6 C)   Ht 5\' 1"  (1.549 m)   Wt 188 lb 6.4 oz (85.5 kg)   LMP  (LMP Unknown)   SpO2 99%   BMI 35.60 kg/m  VS noted,  Constitutional: Pt appears in NAD HENT: Head: NCAT.  Right Ear: External ear normal.  Left Ear: External  ear normal.  Eyes: . Pupils are equal, round, and reactive to light. Conjunctivae and EOM are normal Nose: without d/c or deformity Neck: Neck supple. Gross normal ROM Cardiovascular: Normal rate and regular rhythm.   Pulmonary/Chest: Effort normal and breath sounds without rales or wheezing.  Abd:  Soft, NT, ND, + BS, no organomegaly Neurological: Pt is alert. At baseline orientation, motor grossly intact Skin: Skin is warm. No rashes, other new lesions, no LE edema Psychiatric: Pt behavior is normal without agitation  All otherwise neg per pt  Lab Results  Component Value Date   WBC 6.4 09/11/2018   HGB 13.4 09/11/2018   HCT 40.4 09/11/2018   PLT 221.0 09/11/2018   GLUCOSE 93 03/13/2019   CHOL 109 03/13/2019   TRIG 71.0 03/13/2019   HDL 46.60 03/13/2019   LDLCALC 48 03/13/2019   ALT 18 03/13/2019   AST 19 03/13/2019   NA 140 03/13/2019   K 4.5 03/13/2019   CL 103 03/13/2019   CREATININE 0.88 03/13/2019   BUN 20 03/13/2019   CO2 26 03/13/2019   TSH 2.22 09/11/2018   HGBA1C 5.3 03/13/2019   MICROALBUR 2.2 (H) 09/11/2018        Assessment & Plan:

## 2019-03-19 NOTE — Assessment & Plan Note (Signed)
Etiology unclear, post menopausal, for iron 325 qd, refer GI as may need EGD

## 2019-03-19 NOTE — Assessment & Plan Note (Signed)
For card ct score, to f/u any worsening symptoms or concerns

## 2019-03-19 NOTE — Assessment & Plan Note (Addendum)
Excellent control, d/c glipizide, and change tradjenta to trulicity for control and further wt loss  I spent 31 minutes in preparing to see the patient by review of recent labs, imaging and procedures, obtaining and reviewing separately obtained history, communicating with the patient and family or caregiver, ordering medications, tests or procedures, and documenting clinical information in the EHR including the differential Dx, treatment, and any further evaluation and other management of DM, HLD, iron deficiency

## 2019-03-23 ENCOUNTER — Other Ambulatory Visit: Payer: Self-pay | Admitting: Internal Medicine

## 2019-03-23 NOTE — Telephone Encounter (Signed)
Please refill as per office routine med refill policy (all routine meds refilled for 3 mo or monthly per pt preference up to one year from last visit, then month to month grace period for 3 mo, then further med refills will have to be denied)  

## 2019-04-01 ENCOUNTER — Other Ambulatory Visit: Payer: No Typology Code available for payment source

## 2019-04-02 ENCOUNTER — Other Ambulatory Visit: Payer: Self-pay

## 2019-04-02 ENCOUNTER — Ambulatory Visit: Payer: No Typology Code available for payment source | Admitting: Internal Medicine

## 2019-04-02 ENCOUNTER — Encounter: Payer: Self-pay | Admitting: Internal Medicine

## 2019-04-02 DIAGNOSIS — J301 Allergic rhinitis due to pollen: Secondary | ICD-10-CM | POA: Diagnosis not present

## 2019-04-02 DIAGNOSIS — G4733 Obstructive sleep apnea (adult) (pediatric): Secondary | ICD-10-CM

## 2019-04-02 NOTE — Progress Notes (Signed)
HPI F never smoker followed for OSA, complicated by HBP, Allergic Rhinitis, DM, Hyperlipidemia HST 09/11/2018- AHI 9.8/ hr, desaturation to 82%, body weight 201 lbs  ---------------------------------------------------------------------------  08/21/2018- 58 yoF never smoker Referred by Dr. Jenny Reichmann (PCP) for daytime somnolence. pt reports occasionally waking up in the middle of the night Medical problem list includes HBP, Allergic Rhinitis, DM, Hyperlipidemia,  Covid swab Neg 07/23/18  Body weight today 201 lbs Epworth score 6        Works as Surveyor, minerals. Med list includes alprazolam 0.5 mg, Celexa, Meclizine, TransDerm Scop Husband tells her of loud snoring, witnessed apneas. Occ wakes gasping. Parents snored. Unrested, sleepy around 11 AM. No sleep meds. 1-2 cups coffee. Alprazolam only for airplane travel. Occasional naps with grandchildren. No parasomnias- rare sleep talking.  Denies ENT surgery, lung/ heart or CNS problem.  12/01/2018 Virtual Visit via Telephone Note History of Present Illness: 56 yoF never smoker followed for OSA, complicated by HBP, Allergic Rhinitis, DM, Hyperlipidemia, Family tells her she is not snoring with CPAP and she sleeps better. Still getting used to CPAP mask and headgear- discussed.  CPAP auto 5-15/ [employer]  Observations/Objective: HST 09/11/2018- AHI 9.8/ hr, desaturation to 82%, body weight 201 lbs Download compliance 93%, AHI  11.5/ hr  Assessment and Plan: OSA- benefits. May need mask fitting/ desensitization. Continue 5-15 Rhinitis- not interfering with CPAP at this time of year.   Follow Up Instructions: 4 months    04/02/19- 58 yoF never smoker followed for OSA, complicated by HBP, Allergic Rhinitis, DM, Hyperlipidemia, CPAP auto 5-15/ [employer] Download compliance 90%, AHI 2.2/ hr Body weight today 188 lbs -----f/u OSA. Patient's breathing is at her baseline.  Had fallen and broken both wrists. Now recovered.  Using vaseline in nose for dryness.  Discussed CPAP humidifier.   ROS-see HPI   + = positive Constitutional:    weight loss, night sweats, fevers, chills, fatigue, lassitude. HEENT:    headaches, difficulty swallowing, tooth/dental problems, sore throat,       sneezing, itching, ear ache, nasal congestion, post nasal drip, snoring CV:    chest pain, orthopnea, PND, +swelling in lower extremities, anasarca,                                  dizziness, palpitations Resp:   shortness of breath with exertion or at rest.                productive cough,   non-productive cough, coughing up of blood.              change in color of mucus.  wheezing.   Skin:    rash or lesions. GI:  No-   heartburn, indigestion, abdominal pain, nausea, vomiting, diarrhea,                 change in bowel habits, loss of appetite GU: dysuria, change in color of urine, no urgency or frequency.   flank pain. MS:   joint pain, stiffness, decreased range of motion, back pain. Neuro-     nothing unusual Psych:  change in mood or affect.  depression or anxiety.   memory loss.  OBJ- Physical Exam General- Alert, Oriented, Affect-appropriate, Distress- none acute, + overweight Skin- rash-none, lesions- none, excoriation- none Lymphadenopathy- none Head- atraumatic            Eyes- Gross vision intact, PERRLA, conjunctivae and secretions clear  Ears- Hearing, canals-normal            Nose- Clear, no-Septal dev, mucus, polyps, erosion, perforation             Throat- Mallampati IV , mucosa clear , drainage- none, tonsils-, few missing teeth Neck- flexible , trachea midline, no stridor , thyroid nl, carotid no bruit Chest - symmetrical excursion , unlabored           Heart/CV- RRR , no murmur , no gallop  , no rub, nl s1 s2                           - JVD- none , edema- none, stasis changes- none, varices- none           Lung- clear to P&A, wheeze- none, cough- none , dullness-none, rub- none           Chest wall-  Abd-  Br/ Gen/ Rectal- Not done,  not indicated Extrem- cyanosis- none, clubbing, none, atrophy- none, strength- nl Neuro- grossly intact to observation

## 2019-04-02 NOTE — Patient Instructions (Signed)
We can continue CPAP auto 5-15/ mask of choice, humidifier, supplies, AirView/ card  Consider trying otc nasal saline gel in your nose as needed, instead of vaseline

## 2019-04-03 ENCOUNTER — Ambulatory Visit (INDEPENDENT_AMBULATORY_CARE_PROVIDER_SITE_OTHER): Payer: No Typology Code available for payment source | Admitting: Physician Assistant

## 2019-04-03 ENCOUNTER — Other Ambulatory Visit (INDEPENDENT_AMBULATORY_CARE_PROVIDER_SITE_OTHER): Payer: No Typology Code available for payment source

## 2019-04-03 ENCOUNTER — Encounter: Payer: Self-pay | Admitting: Physician Assistant

## 2019-04-03 ENCOUNTER — Ambulatory Visit (INDEPENDENT_AMBULATORY_CARE_PROVIDER_SITE_OTHER)
Admission: RE | Admit: 2019-04-03 | Discharge: 2019-04-03 | Disposition: A | Discharge status: Home / Self Care | Payer: Self-pay | Source: Ambulatory Visit | Attending: Internal Medicine | Admitting: Internal Medicine

## 2019-04-03 VITALS — BP 96/58 | HR 80 | Temp 98.1°F | Ht 62.0 in | Wt 187.5 lb

## 2019-04-03 DIAGNOSIS — D509 Iron deficiency anemia, unspecified: Secondary | ICD-10-CM | POA: Diagnosis not present

## 2019-04-03 DIAGNOSIS — G4733 Obstructive sleep apnea (adult) (pediatric): Secondary | ICD-10-CM | POA: Insufficient documentation

## 2019-04-03 DIAGNOSIS — E785 Hyperlipidemia, unspecified: Secondary | ICD-10-CM

## 2019-04-03 LAB — CBC WITH DIFFERENTIAL/PLATELET
Basophils Absolute: 0 10*3/uL (ref 0.0–0.1)
Basophils Relative: 0.5 % (ref 0.0–3.0)
Eosinophils Absolute: 0.1 10*3/uL (ref 0.0–0.7)
Eosinophils Relative: 1.1 % (ref 0.0–5.0)
HCT: 39 % (ref 36.0–46.0)
Hemoglobin: 12.9 g/dL (ref 12.0–15.0)
Lymphocytes Relative: 23.9 % (ref 12.0–46.0)
Lymphs Abs: 2 10*3/uL (ref 0.7–4.0)
MCHC: 33 g/dL (ref 30.0–36.0)
MCV: 89.4 fl (ref 78.0–100.0)
Monocytes Absolute: 0.5 10*3/uL (ref 0.1–1.0)
Monocytes Relative: 5.9 % (ref 3.0–12.0)
Neutro Abs: 5.6 10*3/uL (ref 1.4–7.7)
Neutrophils Relative %: 68.6 % (ref 43.0–77.0)
Platelets: 236 10*3/uL (ref 150.0–400.0)
RBC: 4.36 Mil/uL (ref 3.87–5.11)
RDW: 14.7 % (ref 11.5–15.5)
WBC: 8.2 10*3/uL (ref 4.0–10.5)

## 2019-04-03 NOTE — Assessment & Plan Note (Signed)
Mild OSA. She sleeps better with less snoring using CPAP Plan- continue CPAP 5-15, mask of choice, humidifier, supplies, AirView/ card

## 2019-04-03 NOTE — Progress Notes (Signed)
Chief Complaint: Anemia  HPI:    Traci [name] is a 59 year old female with a past medical history as listed below, known to Dr. Rachael Darby from recent colonoscopy, who was referred to me by Biagio Borg, MD for a complaint of anemia.      02/05/2019 colonoscopy done for personal history of adenomatous colon polyps on last colonoscopy more than 3 years ago.  Findings of one 12 to 15 mm polyp in the cecum, one 15-18 mm polyp in the ascending colon and otherwise normal.  Pathology showed tubular adenoma.  Repeat recommended in 1 year for surveillance.    03/13/2019 iron panel with an iron decreased at 24 and percent saturation low at 6.5%.  B12 normal.    Today, patient tells me that she was on a weight loss diet of shakes and bars since January.  She did lose a total of over 25 pounds.  Tells me that her energy level has not decreased at all but she did notice that she was having easy bruising.  Since starting the iron though this has decreased.    Denies fever, chills, heartburn, reflux, nausea, change in bowel habits, blood in her stools, vomiting or abdominal pain.  Past Medical History:  Diagnosis Date  . ALLERGIC RHINITIS 07/07/2008  . Allergy   . Anxiety   . CONTACT DERMATITIS 11/30/2009   resolved  . Depression   . DIABETES MELLITUS, TYPE II 11/29/2006  . Dizziness and giddiness 04/26/2007  . HYPERLIPIDEMIA 01/07/2007  . HYPERTENSION 07/08/2007  . PLANTAR FASCIITIS, LEFT 08/05/2007   resolved  . Preventative health care 07/30/2010  . Sleep apnea    CPAP nightly    Past Surgical History:  Procedure Laterality Date  . COLONOSCOPY  2016   [facility]plan - polyps  . MOUTH SURGERY     teeth extraction  . TUBAL LIGATION    . WISDOM TOOTH EXTRACTION      Current Outpatient Medications  Medication Sig Dispense Refill  . ALPRAZolam (XANAX) 0.5 MG tablet 1-2 tab by mouth as needed with travel 8 tablet 0  . aspirin 81 MG tablet Take 81 mg by mouth daily.      . citalopram (CELEXA) 20 MG tablet  Take 1 tablet by mouth once daily 30 tablet 5  . glucose blood test strip Use as directed once daily to check blood sugar.  Diagnosis code 250.00 100 each 11  . iron polysaccharides (NU-IRON) 150 MG capsule Take 1 capsule (150 mg total) by mouth daily. 90 capsule 1  . Lancets MISC Use as directed once daily to check blood sugar.  Diagnosis code 250.00 100 each 11  . lisinopril (ZESTRIL) 5 MG tablet Take 1 tablet by mouth once daily 30 tablet 5  . meclizine (ANTIVERT) 12.5 MG tablet Take 1 tablet (12.5 mg total) by mouth 3 (three) times daily as needed. 40 tablet 1  . metFORMIN (GLUCOPHAGE) 1000 MG tablet TAKE ONE TABLET BY MOUTH TWICE A DAY WITH MEALS 60 tablet 5  . pioglitazone (ACTOS) 45 MG tablet Take 1 tablet by mouth once daily 90 tablet 1  . pravastatin (PRAVACHOL) 40 MG tablet Take 1 tablet by mouth once daily 90 tablet 1  . scopolamine (TRANSDERM-SCOP, 1.5 MG,) 1 MG/3DAYS Place 1 patch (1.5 mg total) onto the skin every 3 (three) days. (Patient taking differently: Place 1 patch onto the skin every 3 (three) days. ONLY WHEN TRAVELING) 10 patch 12  . TRADJENTA 5 MG TABS tablet Take 5 mg by mouth  daily.    . triamcinolone (NASACORT) 55 MCG/ACT AERO nasal inhaler Place 2 sprays into the nose daily. (Patient taking differently: Place 2 sprays into the nose as needed. ) 1 Inhaler 12  . Dulaglutide (TRULICITY) [id number] 0000000 SOPN Inject 0.75 mg into the skin once a week. (Patient not taking: Reported on 04/02/2019) 6 mL 3   No current facility-administered medications for this visit.    Allergies as of 04/03/2019 - Review Complete 04/03/2019  Allergen Reaction Noted  . Penicillins      Family History  Problem Relation Age of Onset  . Stroke Mother   . Cancer Mother        lung and breast cancer  . Depression Mother   . Heart disease Mother        Attack, Aneurysm  . Hypertension Mother   . Varicose Veins Mother   . Raynaud syndrome Mother   . Hyperlipidemia Mother   . Glaucoma Mother    . Cancer Father        prostate cancer  . Diabetes Father   . Stroke Father        x3  . Hyperlipidemia Father   . Hypertension Father   . Heart disease Father        bypass surgery  . Thrombophilia Father   . Bipolar disorder Maternal Aunt   . Cancer Maternal Aunt        Ovarian  . Fibroids Maternal Aunt   . Diabetes Maternal Aunt   . Hypertension Maternal Uncle   . Diabetes Maternal Uncle   . Diabetes Paternal Aunt   . Hyperlipidemia Paternal Aunt   . Hypertension Paternal Aunt   . Fibroids Paternal Aunt   . Thyroid disease Paternal Aunt   . Diabetes Paternal Uncle   . Stroke Paternal Uncle   . Thrombophilia Paternal Uncle   . Colon cancer Neg Hx   . Colon polyps Neg Hx   . Rectal cancer Neg Hx   . Stomach cancer Neg Hx   . Esophageal cancer Neg Hx     Social History   Socioeconomic History  . Marital status: Married    Spouse name: Not on file  . Number of children: 2  . Years of education: Not on file  . Highest education level: Not on file  Occupational History  . Occupation: Therapist, sports: [employer]  Tobacco Use  . Smoking status: Never Smoker  . Smokeless tobacco: Never Used  Substance and Sexual Activity  . Alcohol use: Not Currently    Alcohol/week: 0.0 standard drinks    Comment: 1-2 drinks every 2-3 weeks  . Drug use: No  . Sexual activity: Yes    Birth control/protection: Post-menopausal    Comment: 1st intercourse 25 yo-5 partners  Other Topics Concern  . Not on file  Social History Narrative  . Not on file   Social Determinants of Health   Financial Resource Strain:   . Difficulty of Paying Living Expenses:   Food Insecurity:   . Worried About Charity fundraiser in the Last Year:   . Arboriculturist in the Last Year:   Transportation Needs:   . Film/video editor (Medical):   Marland Kitchen Lack of Transportation (Non-Medical):   Physical Activity:   . Days of Exercise per Week:   . Minutes of Exercise per Session:     Stress:   . Feeling of Stress :   Social Connections:   .  Frequency of Communication with Friends and Family:   . Frequency of Social Gatherings with Friends and Family:   . Attends Religious Services:   . Active Member of Clubs or Organizations:   . Attends Archivist Meetings:   Marland Kitchen Marital Status:   Intimate Partner Violence:   . Fear of Current or Ex-Partner:   . Emotionally Abused:   Marland Kitchen Physically Abused:   . Sexually Abused:     Review of Systems:    Constitutional: No weight loss, fever or chills Cardiovascular: No chest pain  Respiratory: No SOB  Gastrointestinal: See HPI and otherwise negative   Physical Exam:  Vital signs: BP (!) 96/58   Pulse 80   Temp 98.1 F (36.7 C)   Ht 5\' 2"  (1.575 m)   Wt 187 lb 8 oz (85 kg)   LMP  (LMP Unknown)   BMI 34.29 kg/m   Constitutional:   Pleasant overweight Caucasian female appears to be in NAD, Well developed, Well nourished, alert and cooperative Respiratory: Respirations even and unlabored. Lungs clear to auscultation bilaterally.   No wheezes, crackles, or rhonchi.  Cardiovascular: Normal S1, S2. No MRG. Regular rate and rhythm. No peripheral edema, cyanosis or pallor.  Gastrointestinal:  Soft, nondistended, nontender. No rebound or guarding. Normal bowel sounds. No appreciable masses or hepatomegaly. Rectal:  Not performed.  Psychiatric:Demonstrates good judgement and reason without abnormal affect or behaviors.  RELEVANT LABS AND IMAGING: CBC    Component Value Date/Time   WBC 6.4 09/11/2018 0729   RBC 4.50 09/11/2018 0729   HGB 13.4 09/11/2018 0729   HCT 40.4 09/11/2018 0729   PLT 221.0 09/11/2018 0729   MCV 89.9 09/11/2018 0729   MCHC 33.1 09/11/2018 0729   RDW 14.6 09/11/2018 0729   LYMPHSABS 1.6 09/11/2018 0729   MONOABS 0.4 09/11/2018 0729   EOSABS 0.2 09/11/2018 0729   BASOSABS 0.0 09/11/2018 0729    CMP     Component Value Date/Time   NA 140 03/13/2019 0745   K 4.5 03/13/2019 0745   CL  103 03/13/2019 0745   CO2 26 03/13/2019 0745   GLUCOSE 93 03/13/2019 0745   BUN 20 03/13/2019 0745   CREATININE 0.88 03/13/2019 0745   CALCIUM 9.7 03/13/2019 0745   PROT 7.2 03/13/2019 0745   ALBUMIN 4.0 03/13/2019 0745   AST 19 03/13/2019 0745   ALT 18 03/13/2019 0745   ALKPHOS 52 03/13/2019 0745   BILITOT 0.4 03/13/2019 0745   GFRNONAA 107.10 07/04/2009 0741   GFRAA 138 12/30/2007 0735    Assessment: 1.  IDA: With iron studies showing deficiency at the beginning of March, patient was on a shake diet for 3 months, suspect this had something to do with it, recent normal colonoscopy in January; consider upper GI source of blood loss or decreased iron absorption versus recent diet versus other  Plan: 1.  Discussed with the patient I have high suspicion that this iron deficiency is related to her recent shake dieting. 2.  Patient just had a recent colonoscopy, but will go ahead with work-up with an EGD.  Scheduled patient for an EGD in the [place] with Dr. Loletha Carrow.  Did discuss risks, benefits, limitations and alternatives and the patient agrees to proceed. 3.  Patient will continue her iron supplementation in the meantime. 4. Ordered CBC today, do not see recent one in chart. 5.  Patient to follow in clinic per recommendations from Dr. Loletha Carrow after time of procedure.  Ellouise Newer, PA-C [facility] 04/03/2019,  2:48 PM  Cc: Biagio Borg, MD

## 2019-04-03 NOTE — Assessment & Plan Note (Signed)
CPAP seems to be aggravating rhinitis with dryness at night.  Discussed humidifier. Plan- add saline nasal gel.

## 2019-04-03 NOTE — Progress Notes (Signed)
____________________________________________________________  Attending physician addendum:  Thank you for sending this case to me. I have reviewed the entire note, and the outlined plan seems appropriate.  Difficult to know the significance of asymptomatic iron deficiency without anemia.  May be insufficient dietary iron intake, as you suggested.  EGD reasonable to rule out UGI source of blood loss.  Wilfrid Lund, MD  ____________________________________________________________

## 2019-04-03 NOTE — Patient Instructions (Signed)
If you are age 58 or older, your body mass index should be between 23-30. Your Body mass index is 34.29 kg/m. If this is out of the aforementioned range listed, please consider follow up with your Primary Care Provider.  If you are age 52 or younger, your body mass index should be between 19-25. Your Body mass index is 34.29 kg/m. If this is out of the aformentioned range listed, please consider follow up with your Primary Care Provider.   You have been scheduled for an endoscopy. Please follow written instructions given to you at your visit today. If you use inhalers (even only as needed), please bring them with you on the day of your procedure.  Your provider has requested that you go to the basement level for lab work before leaving today. Press "B" on the elevator. The lab is located at the first door on the left as you exit the elevator.  Follow up pending the results of procedure.

## 2019-04-06 ENCOUNTER — Telehealth: Payer: Self-pay

## 2019-04-06 NOTE — Telephone Encounter (Signed)
Key: [id number]

## 2019-04-07 ENCOUNTER — Telehealth: Payer: Self-pay

## 2019-04-07 NOTE — Telephone Encounter (Signed)
New message     The patient is asking for the MD to call to discuss the CT report.

## 2019-04-08 NOTE — Telephone Encounter (Signed)
Pt contacted and is concerned about the results from the CT Cardiac Scoring that is in mychart.   Pt is requesting provider to interpret as soon as possible.   Informed pt that he is working on it but will let PCP know that pt is very concerned.

## 2019-04-08 NOTE — Telephone Encounter (Signed)
Unfortunately the result is still pending, and is only given to [place] after a cardiologist adds his interpretation to the results that she sees from the radiologist, which is neg for any concerning new problem

## 2019-04-14 ENCOUNTER — Telehealth: Payer: Self-pay

## 2019-04-14 NOTE — Telephone Encounter (Signed)
New message   The patient calling asking for Dr. Jenny Reichmann to call regarding my cardiac CT.   Would she need a referral to sports medicine?

## 2019-04-14 NOTE — Telephone Encounter (Signed)
The CT cardiac score was zero (the best you can have) so no problem, and the nodules noted on the CT chest overread are not significant.  No further evaluation or treatment needed.  She would not need to see sports med based on this. thanks

## 2019-04-14 NOTE — Telephone Encounter (Deleted)
error 

## 2019-04-15 ENCOUNTER — Ambulatory Visit: Payer: No Typology Code available for payment source | Attending: Internal Medicine

## 2019-04-15 DIAGNOSIS — Z23 Encounter for immunization: Secondary | ICD-10-CM

## 2019-04-15 NOTE — Telephone Encounter (Signed)
Called pt no answer LMOM w/MD response../lmb 

## 2019-04-15 NOTE — Telephone Encounter (Signed)
F/u  The patient returning call back to the [facility]

## 2019-04-15 NOTE — Progress Notes (Signed)
   Covid-19 Vaccination Clinic  Name:  [name]    MRN: [id number] DOB: 11/17/1960  04/15/2019  [name] was observed post Covid-19 immunization for 15 minutes without incident. She was provided with Vaccine Information Sheet and instruction to access the V-Safe system.   [name] was instructed to call 911 with any severe reactions post vaccine: Marland Kitchen Difficulty breathing  . Swelling of face and throat  . A fast heartbeat  . A bad rash all over body  . Dizziness and weakness   Immunizations Administered    Name Date Dose VIS Date Route   Pfizer COVID-19 Vaccine 04/15/2019  8:17 AM 0.3 mL 12/19/2018 Intramuscular   Manufacturer: [employer], [employer]   Lot: [id number]   [place]: [id number]

## 2019-04-20 ENCOUNTER — Encounter: Payer: Self-pay | Admitting: Family Medicine

## 2019-04-20 ENCOUNTER — Other Ambulatory Visit: Payer: Self-pay

## 2019-04-20 ENCOUNTER — Ambulatory Visit: Payer: Self-pay

## 2019-04-20 ENCOUNTER — Ambulatory Visit: Payer: No Typology Code available for payment source | Admitting: Family Medicine

## 2019-04-20 VITALS — BP 110/68 | HR 74 | Ht 62.0 in | Wt 188.4 lb

## 2019-04-20 DIAGNOSIS — M79671 Pain in right foot: Secondary | ICD-10-CM

## 2019-04-20 DIAGNOSIS — M79672 Pain in left foot: Secondary | ICD-10-CM | POA: Diagnosis not present

## 2019-04-20 NOTE — Progress Notes (Signed)
    Subjective:    CC:  B heel pain  I, Traci [name], LAT, ATC, am serving as scribe for Dr. [name].  HPI: Pt is a 59 y/o female presenting w/ c/o B heel pain x couple months.  Her L heel pain is plantar heel pain and her R heel pain is located to her post calcaneus and into her R Achille's.  She rates her pain as moderate and describes her pain as aching.  Her pain is worse first thing in the morning.  Patient notes that should be helping her daughter move in [place] in about 3 weeks.  She will be doing a lot of walking around the city then.  Radiating pain: Yes into her R calf Numbness/tingling: Tingling in her L heel Aggravating factors: Walking; prolonged standing; stairs Treatments tried: Ice, elevation, Advil; ankle sleeve  Pertinent review of Systems: No fevers or chills  Relevant historical information: History of plantar fasciitis remote past.   Objective:    Vitals:   04/20/19 1507  BP: 110/68  Pulse: 74  SpO2: 96%   General: Well Developed, well nourished, and in no acute distress.   MSK:  Right heel: Small prominence posterior calcaneus.  Normal-appearing foot and heel otherwise. Normal foot and ankle motion. Mildly tender posterior calcaneus otherwise nontender. Normal foot and ankle motion.  Stable ligaments exam intact strength.  Left foot normal-appearing Tender palpation plantar calcaneus otherwise nontender. Normal foot and ankle motion.  Stable limits exam. Pulses cap refill and sensation intact distally.  Lab and Radiology Results  Diagnostic Limited MSK Ultrasound of: Right heel Achilles tendon normal-appearing until insertion.  Insertion bony prominence and osteophyte present.  This is the area of prominence seen on physical exam. No increased Doppler activity in this region. No significant hypoechoic change within tendon. No significant retrocalcaneal bursitis.  Impression: Insertional Achilles tendinitis with  osteophyte  Diagnostic Limited MSK Ultrasound of: Left heel Normal Achilles tendon. Plantar flexion with slightly thickened at insertion onto the calcaneus.  Area of maximum tenderness. Impression: Plantar fasciitis     Impression and Recommendations:    Assessment and Plan: 59 y.o. female with right heel pain.  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  Patient is already started some pragmatic treatment is likely to help.  She has excellent after marked insoles.  We will add eccentric exercises as the fundamental treatment for her plantar fasciitis and Achilles tendinitis.  Additionally add Voltaren gel.  Discussed nitroglycerin patches but will hold off on those and use if not improved.  Recheck back as needed.  Precautions reviewed.  Happy to prescribe nitroglycerin patches with a phone call if needed.   PDMP not reviewed this encounter. Orders Placed This Encounter  Procedures  . [place] LIMITED JOINT SPACE STRUCTURES LOW BILAT(NO LINKED CHARGES)    Order Specific Question:   Reason for Exam (SYMPTOM  OR DIAGNOSIS REQUIRED)    Answer:   B heel pain    Order Specific Question:   Preferred imaging location?    Answer:   [facility]   No orders of the defined types were placed in this encounter.   Discussed warning signs or symptoms. Please see discharge instructions. Patient expresses understanding.   The above documentation has been reviewed and is accurate and complete [name]

## 2019-04-20 NOTE — Patient Instructions (Signed)
Thank you for coming in today. Use the heel exercises.  Go from up to down slowly  Do about 10-30 reps 2-3x daily.  Try using voltaren gel.  Let me know if you want to do the nitro patches.    Nitroglycerin Protocol   Apply 1/4 nitroglycerin patch to affected area daily.  Change position of patch within the affected area every 24 hours.  You may experience a headache during the first 1-2 weeks of using the patch, these should subside.  If you experience headaches after beginning nitroglycerin patch treatment, you may take your preferred over the counter pain reliever.  Another side effect of the nitroglycerin patch is skin irritation or rash related to patch adhesive.  Please notify our office if you develop more severe headaches or rash, and stop the patch.  Tendon healing with nitroglycerin patch may require 12 to 24 weeks depending on the extent of injury.  Men should not use if taking Viagra, Cialis, or Levitra.   Do not use if you have migraines or rosacea.

## 2019-04-26 ENCOUNTER — Other Ambulatory Visit: Payer: Self-pay | Admitting: Internal Medicine

## 2019-04-30 ENCOUNTER — Telehealth: Payer: Self-pay

## 2019-04-30 ENCOUNTER — Ambulatory Visit (AMBULATORY_SURGERY_CENTER): Payer: No Typology Code available for payment source | Admitting: Gastroenterology

## 2019-04-30 ENCOUNTER — Other Ambulatory Visit: Payer: Self-pay

## 2019-04-30 ENCOUNTER — Encounter: Payer: Self-pay | Admitting: Gastroenterology

## 2019-04-30 VITALS — BP 100/56 | HR 69 | Temp 97.1°F | Resp 16 | Ht 62.0 in | Wt 187.0 lb

## 2019-04-30 DIAGNOSIS — D509 Iron deficiency anemia, unspecified: Secondary | ICD-10-CM

## 2019-04-30 DIAGNOSIS — K3189 Other diseases of stomach and duodenum: Secondary | ICD-10-CM

## 2019-04-30 DIAGNOSIS — R109 Unspecified abdominal pain: Secondary | ICD-10-CM

## 2019-04-30 MED ORDER — SODIUM CHLORIDE 0.9 % IV SOLN: 500.0000 mL | Freq: Once | INTRAVENOUS | Status: DC

## 2019-04-30 NOTE — Telephone Encounter (Signed)
Patient returned your call, please call patient one more time.   

## 2019-04-30 NOTE — Op Note (Signed)
[place] [facility] Patient Name: Traci [name] Procedure Date: 04/30/2019 9:32 AM MRN: [id number] Endoscopist: Mallie Mussel L. Loletha Carrow , MD Age: 59 Referring MD:  Date of Birth: 08-20-1960 Gender: Female Account #: [id number] Procedure:                Upper GI endoscopy Indications:              Iron deficiency without anemia (iron sat 6.5 %,                            normal hemoglobin - routine colonoscopy several                            months ago) Medicines:                Monitored Anesthesia Care Procedure:                Pre-Anesthesia Assessment:                           - Prior to the procedure, a History and Physical                            was performed, and patient medications and                            allergies were reviewed. The patient's tolerance of                            previous anesthesia was also reviewed. The risks                            and benefits of the procedure and the sedation                            options and risks were discussed with the patient.                            All questions were answered, and informed consent                            was obtained. Prior Anticoagulants: The patient has                            taken no previous anticoagulant or antiplatelet                            agents. ASA Grade Assessment: II - A patient with                            mild systemic disease. After reviewing the risks                            and benefits, the patient was deemed in  satisfactory condition to undergo the procedure.                           After obtaining informed consent, the endoscope was                            passed under direct vision. Throughout the                            procedure, the patient's blood pressure, pulse, and                            oxygen saturations were monitored continuously. The                            Endoscope was introduced through the mouth, and                             advanced to the second part of duodenum. The upper                            GI endoscopy was accomplished without difficulty.                            The patient tolerated the procedure well. Scope In: Scope Out: Findings:                 The esophagus was normal.                           A large amount of food (residue) was found in the                            entire examined stomach. This significantly limited                            visualization.                           The examined duodenum was normal. Complications:            No immediate complications. Estimated Blood Loss:     Estimated blood loss: none. Impression:               - Normal esophagus.                           - A large amount of food (residue) in the stomach.                           - Normal examined duodenum.                           - No specimens collected.                           This patient's decreased iron saturation  without                            anemia is not suspected to be from chronic GI blood                            loss. Recommendation:           - Patient has a contact number available for                            emergencies. The signs and symptoms of potential                            delayed complications were discussed with the                            patient. Return to normal activities tomorrow.                            Written discharge instructions were provided to the                            patient.                           - Gastroparesis diet.                           - Continue present medications.                           - If NPO after MN status confirmed with patient,                            schedule gastric emptying study. Traci [name] L. Loletha Carrow, MD 04/30/2019 9:59:46 AM This report has been signed electronically.

## 2019-04-30 NOTE — Telephone Encounter (Signed)
Pt scheduled at [facility] 05/20/19 for GES at 7:30am, pt to arrive at 7am and be NPO and hold all stomach meds after midnight.  Left message for pt to call back.  Spoke with pt and she is aware of appt and instructions.

## 2019-04-30 NOTE — Progress Notes (Signed)
PT taken to PACU. Monitors in place. VSS. Report given to RN. 

## 2019-04-30 NOTE — Patient Instructions (Signed)
Handout given for gastroparesis diet.  YOU HAD AN ENDOSCOPIC PROCEDURE TODAY AT [facility]:   Refer to the procedure report that was given to you for any specific questions about what was found during the examination.  If the procedure report does not answer your questions, please call your gastroenterologist to clarify.  If you requested that your care partner not be given the details of your procedure findings, then the procedure report has been included in a sealed envelope for you to review at your convenience later.  YOU SHOULD EXPECT: Some feelings of bloating in the abdomen. Passage of more gas than usual.  Walking can help get rid of the air that was put into your GI tract during the procedure and reduce the bloating. If you had a lower endoscopy (such as a colonoscopy or flexible sigmoidoscopy) you may notice spotting of blood in your stool or on the toilet paper. If you underwent a bowel prep for your procedure, you may not have a normal bowel movement for a few days.  Please Note:  You might notice some irritation and congestion in your nose or some drainage.  This is from the oxygen used during your procedure.  There is no need for concern and it should clear up in a day or so.  SYMPTOMS TO REPORT IMMEDIATELY:   Following upper endoscopy (EGD)  Vomiting of blood or coffee ground material  New chest pain or pain under the shoulder blades  Painful or persistently difficult swallowing  New shortness of breath  Fever of 100F or higher  Black, tarry-looking stools  For urgent or emergent issues, a gastroenterologist can be reached at any hour by calling [phone]. Do not use MyChart messaging for urgent concerns.    DIET:  We do recommend a small meal at first, but then you may proceed to your GASTROPARESIS  diet.  Drink plenty of fluids but you should avoid alcoholic beverages for 24 hours.  ACTIVITY:  You should plan to take it easy for the rest of today and  you should NOT DRIVE or use heavy machinery until tomorrow (because of the sedation medicines used during the test).    FOLLOW UP: Our staff will call the number listed on your records 48-72 hours following your procedure to check on you and address any questions or concerns that you may have regarding the information given to you following your procedure. If we do not reach you, we will leave a message.  We will attempt to reach you two times.  During this call, we will ask if you have developed any symptoms of COVID 19. If you develop any symptoms (ie: fever, flu-like symptoms, shortness of breath, cough etc.) before then, please call [phone].  If you test positive for Covid 19 in the 2 weeks post procedure, please call and report this information to [place].    If any biopsies were taken you will be contacted by phone or by letter within the next 1-3 weeks.  Please call us at [phone] if you have not heard about the biopsies in 3 weeks.    SIGNATURES/CONFIDENTIALITY: You and/or your care partner have signed paperwork which will be entered into your electronic medical record.  These signatures attest to the fact that that the information above on your After Visit Summary has been reviewed and is understood.  Full responsibility of the confidentiality of this discharge information lies with you and/or your care-partner.

## 2019-04-30 NOTE — Telephone Encounter (Signed)
-----   Message from Doran Stabler, MD sent at 04/30/2019 12:05 PM EDT ----- Vaughan Basta,    Patient had EGD today.  Please schedule gastric emptying study.  Retained food on EGD, diabetic.  Thank you.  - HD

## 2019-05-04 ENCOUNTER — Telehealth: Payer: Self-pay | Admitting: *Deleted

## 2019-05-04 ENCOUNTER — Telehealth: Payer: Self-pay

## 2019-05-04 NOTE — Telephone Encounter (Signed)
  Follow up Call-  Call back number 04/30/2019 02/05/2019  Post procedure Call Back phone  # [phone]  Permission to leave phone message Yes Yes  Some recent data might be hidden     No answer at # given.  LM on VM.

## 2019-05-04 NOTE — Telephone Encounter (Signed)
2nd follow up call made.  NALM 

## 2019-05-20 ENCOUNTER — Ambulatory Visit (HOSPITAL_COMMUNITY)
Admission: RE | Admit: 2019-05-20 | Discharge: 2019-05-20 | Disposition: A | Discharge status: Home / Self Care | Payer: No Typology Code available for payment source | Source: Ambulatory Visit | Attending: Gastroenterology | Admitting: Gastroenterology

## 2019-05-20 ENCOUNTER — Other Ambulatory Visit: Payer: Self-pay

## 2019-05-20 DIAGNOSIS — R109 Unspecified abdominal pain: Secondary | ICD-10-CM | POA: Insufficient documentation

## 2019-05-20 DIAGNOSIS — K3189 Other diseases of stomach and duodenum: Secondary | ICD-10-CM | POA: Insufficient documentation

## 2019-05-20 MED ORDER — TECHNETIUM TC 99M SULFUR COLLOID
1.9000 | Freq: Once | INTRAVENOUS | Status: AC | PRN
  Administered 2019-05-20: 1.9 via INTRAVENOUS

## 2019-05-29 ENCOUNTER — Telehealth: Payer: Self-pay | Admitting: Gastroenterology

## 2019-05-29 NOTE — Telephone Encounter (Signed)
See result note.  

## 2019-06-03 ENCOUNTER — Telehealth: Payer: Self-pay | Admitting: Family Medicine

## 2019-06-03 MED ORDER — NITROGLYCERIN 0.2 MG/HR TD PT24: MEDICATED_PATCH | TRANSDERMAL | 1 refills | Status: DC

## 2019-06-03 NOTE — Telephone Encounter (Signed)
Called and LM for pt regarding fact that Dr. Georgina Snell sent in rx for nitro patches.  Advised pt on proper use of pt on VM.

## 2019-06-03 NOTE — Telephone Encounter (Signed)
Nitroglycerin patch as ordered

## 2019-06-03 NOTE — Telephone Encounter (Signed)
Pt called, has been trying our suggestions for a month and is still having trouble with her heel/foot pain. She states she was advised to call if not better and we would try some patches ( nitroglycerin ?).  [place] [phone]

## 2019-07-07 ENCOUNTER — Ambulatory Visit (INDEPENDENT_AMBULATORY_CARE_PROVIDER_SITE_OTHER): Payer: No Typology Code available for payment source | Admitting: Gastroenterology

## 2019-07-07 ENCOUNTER — Encounter: Payer: Self-pay | Admitting: Gastroenterology

## 2019-07-07 VITALS — BP 114/60 | HR 76 | Ht 62.0 in | Wt 192.0 lb

## 2019-07-07 DIAGNOSIS — K3184 Gastroparesis: Secondary | ICD-10-CM | POA: Diagnosis not present

## 2019-07-07 NOTE — Progress Notes (Signed)
[facility] Progress Note  Chief Complaint: Gastroparesis  Subjective  History: Routine surveillance colonoscopy with me January this year. Clinic visit March 26 after referral by primary care for low iron level without anemia. EGD 04/30/2019 visualization limited by large amount of retained food in the stomach.  Low iron level was not felt likely to be due to chronic GI blood loss.  Patient confirmed after procedure that she had been n.p.o. after midnight as instructed, so gastric emptying study was ordered.  The study showed delayed gastric emptying, so my recommendation was to focus on the dietary interventions before considering medicine such as metoclopramide.  I also recommended a baseline twelve-lead EKG with primary care in case metoclopramide therapy needed (communicated this request to primary care, but it does not appear that an EKG was done).  Traci [name] was here to discuss EGD and gastric emptying study findings. She does not have early satiety, abdominal pain, nausea or vomiting.  She read the gastroparesis dietary paperwork we gave her, but felt it was inconsistent with what she needs to maintain good diabetic control, such as white bread rather than whole grains, and decreased consumption of vegetables.  ROS: Cardiovascular:  no chest pain Respiratory: no dyspnea  The patient's Past Medical, Family and Social History were reviewed and are on file in the EMR.  Objective:  Med list reviewed  Current Outpatient Medications:  .  ALPRAZolam (XANAX) 0.5 MG tablet, 1-2 tab by mouth as needed with travel, Disp: 8 tablet, Rfl: 0 .  aspirin 81 MG tablet, Take 81 mg by mouth daily.  , Disp: , Rfl:  .  citalopram (CELEXA) 20 MG tablet, Take 1 tablet (20 mg total) by mouth daily. Annual appt due in Sept must see provider for future refills, Disp: 90 tablet, Rfl: 1 .  Dulaglutide (TRULICITY) 1.09 NA/3.5TD SOPN, Inject 0.75 mg into the skin once a week., Disp: 6 mL, Rfl: 3 .   glucose blood test strip, Use as directed once daily to check blood sugar.  Diagnosis code 250.00, Disp: 100 each, Rfl: 11 .  iron polysaccharides (NU-IRON) 150 MG capsule, Take 1 capsule (150 mg total) by mouth daily., Disp: 90 capsule, Rfl: 1 .  Lancets MISC, Use as directed once daily to check blood sugar.  Diagnosis code 250.00, Disp: 100 each, Rfl: 11 .  lisinopril (ZESTRIL) 5 MG tablet, Take 1 tablet (5 mg total) by mouth daily. Annual appt due in Sept must see provider for future refills, Disp: 90 tablet, Rfl: 1 .  meclizine (ANTIVERT) 12.5 MG tablet, Take 1 tablet (12.5 mg total) by mouth 3 (three) times daily as needed., Disp: 40 tablet, Rfl: 1 .  metFORMIN (GLUCOPHAGE) 1000 MG tablet, TAKE ONE TABLET BY MOUTH TWICE A DAY WITH MEALS, Disp: 60 tablet, Rfl: 5 .  nitroGLYCERIN (NITRODUR - DOSED IN MG/24 HR) 0.2 mg/hr patch, Apply 1/4 patch daily to tendon for tendonitis., Disp: 30 patch, Rfl: 1 .  pioglitazone (ACTOS) 45 MG tablet, Take 1 tablet (45 mg total) by mouth daily. Annual appt due in Sept must see provider for future refills, Disp: 90 tablet, Rfl: 1 .  pravastatin (PRAVACHOL) 40 MG tablet, Take 1 tablet (40 mg total) by mouth daily. Annual appt due in Sept must see provider for future refills, Disp: 90 tablet, Rfl: 1 .  scopolamine (TRANSDERM-SCOP, 1.5 MG,) 1 MG/3DAYS, Place 1 patch (1.5 mg total) onto the skin every 3 (three) days. (Patient taking differently: Place 1 patch onto the skin  every 3 (three) days. ONLY WHEN TRAVELING), Disp: 10 patch, Rfl: 12 .  triamcinolone (NASACORT) 55 MCG/ACT AERO nasal inhaler, Place 2 sprays into the nose daily. (Patient taking differently: Place 2 sprays into the nose as needed. ), Disp: 1 Inhaler, Rfl: 12   Vital signs in last 24 hrs: Vitals:   07/07/19 1605  BP: 114/60  Pulse: 76   No exam, 20 minutes spent in discussion Labs:  Most recent hemoglobin A1c of 5.3 on 03/13/2019.  No values higher than 7.4 going back to 2015. (9.8 in  2014) ___________________________________________ Radiologic studies: CLINICAL DATA:  Bloating and gastric retention.  Diabetes.   EXAM: NUCLEAR MEDICINE GASTRIC EMPTYING SCAN   TECHNIQUE: After oral ingestion of radiolabeled meal, sequential abdominal images were obtained for 4 hours. Percentage of activity emptying the stomach was calculated at 1 hour, 2 hour, 3 hour, and 4 hours.   RADIOPHARMACEUTICALS:  1.9 mCi Tc-29m sulfur colloid in standardized meal   COMPARISON:  None.   FINDINGS: Expected location of the stomach in the left upper quadrant. Ingested meal empties the stomach gradually over the course of the study.   15% emptied at 1 hr ( normal >= 10%)   27% emptied at 2 hr ( normal >= 40%)   47% emptied at 3 hr ( normal >= 70%)   56% emptied at 4 hr ( normal >= 90%)   IMPRESSION: Delayed gastric emptying study.     Electronically Signed   By: Marlaine Hind M.D.   On: 05/21/2019 08:23   ____________________________________________ Other:   _____________________________________________ Assessment & Plan  Assessment: Encounter Diagnosis  Name Primary?  . Gastroparesis Yes   It is curious that this occurred in the setting of well-controlled diabetes.  She is also seemingly asymptomatic, so I cannot see the benefit of metoclopramide at this point.  We briefly discussed the medicine and its potential risks, and she is agreeable to avoiding it if possible.  I think modest food portions and consumption of vegetables is a healthy choice for her.  Perhaps the most important intervention would be not eating within several hours of bedtime so as to decrease the chance of nocturnal reflux since she has delayed gastric emptying.  Perhaps retiming largest meal of the day to lunch rather than supper would be healthier.   Plan:  If she starts developing symptoms such as early satiety, and more so nausea vomiting, then I would want to hear from her and perhaps  medication should be reconsidered.  22 minutes were spent on this encounter (including chart review, history/exam, counseling/coordination of care, and documentation)  Nelida Meuse III

## 2019-07-07 NOTE — Patient Instructions (Signed)
If you are age 58 or older, your body mass index should be between 23-30. Your Body mass index is 35.12 kg/m. If this is out of the aforementioned range listed, please consider follow up with your Primary Care Provider.  If you are age 25 or younger, your body mass index should be between 19-25. Your Body mass index is 35.12 kg/m. If this is out of the aformentioned range listed, please consider follow up with your Primary Care Provider.   It was a pleasure to see you today!  Dr. Loletha Carrow

## 2019-08-13 DIAGNOSIS — M6528 Calcific tendinitis, other site: Secondary | ICD-10-CM | POA: Insufficient documentation

## 2019-09-17 ENCOUNTER — Other Ambulatory Visit (INDEPENDENT_AMBULATORY_CARE_PROVIDER_SITE_OTHER): Payer: No Typology Code available for payment source

## 2019-09-17 DIAGNOSIS — Z0000 Encounter for general adult medical examination without abnormal findings: Secondary | ICD-10-CM

## 2019-09-17 DIAGNOSIS — E119 Type 2 diabetes mellitus without complications: Secondary | ICD-10-CM | POA: Diagnosis not present

## 2019-09-17 DIAGNOSIS — E611 Iron deficiency: Secondary | ICD-10-CM

## 2019-09-17 LAB — MICROALBUMIN / CREATININE URINE RATIO
Creatinine,U: 79.1 mg/dL
Microalb Creat Ratio: 0.9 mg/g (ref 0.0–30.0)
Microalb, Ur: <0.7 mg/dL (ref 0.0–1.9)

## 2019-09-17 LAB — LIPID PANEL
Cholesterol: 129 mg/dL (ref 0–200)
HDL: 45 mg/dL (ref >39.00)
LDL Cholesterol: 59 mg/dL (ref 0–99)
NonHDL: 84.17
Total CHOL/HDL Ratio: 3
Triglycerides: 124 mg/dL (ref 0.0–149.0)
VLDL: 24.8 mg/dL (ref 0.0–40.0)

## 2019-09-17 LAB — HEPATIC FUNCTION PANEL
ALT: 14 U/L (ref 0–35)
AST: 14 U/L (ref 0–37)
Albumin: 4.3 g/dL (ref 3.5–5.2)
Alkaline Phosphatase: 55 U/L (ref 39–117)
Bilirubin, Direct: 0 mg/dL (ref 0.0–0.3)
Total Bilirubin: 0.3 mg/dL (ref 0.2–1.2)
Total Protein: 6.9 g/dL (ref 6.0–8.3)

## 2019-09-17 LAB — URINALYSIS, ROUTINE W REFLEX MICROSCOPIC
Bilirubin Urine: NEGATIVE
Hgb urine dipstick: NEGATIVE
Ketones, ur: NEGATIVE
Leukocytes,Ua: NEGATIVE
Nitrite: NEGATIVE
RBC / HPF: NONE SEEN (ref 0–?)
Specific Gravity, Urine: 1.02 (ref 1.000–1.030)
Total Protein, Urine: NEGATIVE
Urine Glucose: NEGATIVE
Urobilinogen, UA: 0.2 (ref 0.0–1.0)
WBC, UA: NONE SEEN (ref 0–?)
pH: 5.5 (ref 5.0–8.0)

## 2019-09-17 LAB — IBC PANEL
Iron: 71 ug/dL (ref 42–145)
Saturation Ratios: 15.7 % — ABNORMAL LOW (ref 20.0–50.0)
Transferrin: 324 mg/dL (ref 212.0–360.0)

## 2019-09-17 LAB — CBC WITH DIFFERENTIAL/PLATELET
Basophils Absolute: 0 10*3/uL (ref 0.0–0.1)
Basophils Relative: 0.5 % (ref 0.0–3.0)
Eosinophils Absolute: 0.2 10*3/uL (ref 0.0–0.7)
Eosinophils Relative: 3.2 % (ref 0.0–5.0)
HCT: 41.8 % (ref 36.0–46.0)
Hemoglobin: 13.8 g/dL (ref 12.0–15.0)
Lymphocytes Relative: 32.2 % (ref 12.0–46.0)
Lymphs Abs: 1.9 10*3/uL (ref 0.7–4.0)
MCHC: 32.9 g/dL (ref 30.0–36.0)
MCV: 91.8 fl (ref 78.0–100.0)
Monocytes Absolute: 0.4 10*3/uL (ref 0.1–1.0)
Monocytes Relative: 6.5 % (ref 3.0–12.0)
Neutro Abs: 3.3 10*3/uL (ref 1.4–7.7)
Neutrophils Relative %: 57.6 % (ref 43.0–77.0)
Platelets: 223 10*3/uL (ref 150.0–400.0)
RBC: 4.55 Mil/uL (ref 3.87–5.11)
RDW: 13.9 % (ref 11.5–15.5)
WBC: 5.8 10*3/uL (ref 4.0–10.5)

## 2019-09-17 LAB — TSH: TSH: 2.13 u[IU]/mL (ref 0.35–4.50)

## 2019-09-17 LAB — HEMOGLOBIN A1C: Hgb A1c MFr Bld: 5.6 % (ref 4.6–6.5)

## 2019-09-17 LAB — BASIC METABOLIC PANEL
BUN: 17 mg/dL (ref 6–23)
CO2: 24 mEq/L (ref 19–32)
Calcium: 9.2 mg/dL (ref 8.4–10.5)
Chloride: 106 mEq/L (ref 96–112)
Creatinine, Ser: 0.77 mg/dL (ref 0.40–1.20)
GFR: 76.88 mL/min (ref >60.00)
Glucose, Bld: 112 mg/dL — ABNORMAL HIGH (ref 70–99)
Potassium: 4.2 mEq/L (ref 3.5–5.1)
Sodium: 138 mEq/L (ref 135–145)

## 2019-09-21 ENCOUNTER — Ambulatory Visit: Payer: No Typology Code available for payment source | Admitting: Internal Medicine

## 2019-09-22 ENCOUNTER — Ambulatory Visit: Payer: No Typology Code available for payment source | Admitting: Internal Medicine

## 2019-09-22 ENCOUNTER — Other Ambulatory Visit: Payer: Self-pay

## 2019-09-22 ENCOUNTER — Encounter: Payer: Self-pay | Admitting: Internal Medicine

## 2019-09-22 VITALS — BP 130/70 | HR 75 | Temp 98.7°F | Ht 62.0 in | Wt 193.0 lb

## 2019-09-22 DIAGNOSIS — Z0000 Encounter for general adult medical examination without abnormal findings: Secondary | ICD-10-CM

## 2019-09-22 DIAGNOSIS — E119 Type 2 diabetes mellitus without complications: Secondary | ICD-10-CM | POA: Diagnosis not present

## 2019-09-22 DIAGNOSIS — E611 Iron deficiency: Secondary | ICD-10-CM | POA: Diagnosis not present

## 2019-09-22 DIAGNOSIS — R918 Other nonspecific abnormal finding of lung field: Secondary | ICD-10-CM

## 2019-09-22 NOTE — Assessment & Plan Note (Signed)
Stable, ok to d/c the iron for now, f/u lab at next visit

## 2019-09-22 NOTE — Progress Notes (Addendum)
Subjective:    Patient ID: Traci [name], female    DOB: [birth year], 59 y.o.   MRN: [id number]  HPI   Here for wellness and f/u;  Overall doing ok;  Pt denies Chest pain, worsening SOB, DOE, wheezing, orthopnea, PND, worsening LE edema, palpitations, dizziness or syncope.  Pt denies neurological change such as new headache, facial or extremity weakness.  Pt denies polydipsia, polyuria, or low sugar symptoms. Pt states overall good compliance with treatment and medications, good tolerability, and has been trying to follow appropriate diet.  Pt denies worsening depressive symptoms, suicidal ideation or panic. No fever, night sweats, wt loss, loss of appetite, or other constitutional symptoms.  Pt states good ability with ADL's, has low fall risk, home safety reviewed and adequate, no other significant changes in hearing or vision, and only occasionally active with exercise. Wt Readings from Last 3 Encounters:  09/22/19 193 lb (87.5 kg)  07/07/19 192 lb (87.1 kg)  04/30/19 187 lb (84.8 kg)  To see derm in October.; also seeing podiatry for plantar fasciitis (no neuropathy), found to have delayed gastric empyting per GI but little to no current symptoms, and was found to have severe < 8 mm pulm nodules on recent CT cardiac scoring with recommendation to consider f/u at 12 mo.  Past Medical History:  Diagnosis Date  . ALLERGIC RHINITIS 07/07/2008  . Allergy   . Anxiety   . CONTACT DERMATITIS 11/30/2009   resolved  . Depression   . DIABETES MELLITUS, TYPE II 11/29/2006  . Dizziness and giddiness 04/26/2007  . HYPERLIPIDEMIA 01/07/2007  . HYPERTENSION 07/08/2007  . PLANTAR FASCIITIS, LEFT 08/05/2007   resolved  . Preventative health care 07/30/2010  . Sleep apnea    CPAP nightly   Past Surgical History:  Procedure Laterality Date  . COLONOSCOPY  2016   kalplan - polyps  . COLONOSCOPY  01/2019  . MOUTH SURGERY     teeth extraction  . TUBAL LIGATION    . WISDOM TOOTH EXTRACTION       reports that she has never smoked. She has never used smokeless tobacco. She reports previous alcohol use. She reports that she does not use drugs. family history includes Bipolar disorder in her maternal aunt; Cancer in her father, maternal aunt, and mother; Depression in her mother; Diabetes in her father, maternal aunt, maternal uncle, paternal aunt, and paternal uncle; Fibroids in her maternal aunt and paternal aunt; Glaucoma in her mother; Heart disease in her father and mother; Hyperlipidemia in her father, mother, and paternal aunt; Hypertension in her father, maternal uncle, mother, and paternal aunt; Raynaud syndrome in her mother; Stroke in her father, mother, and paternal uncle; Thrombophilia in her father and paternal uncle; Thyroid disease in her paternal aunt; Varicose Veins in her mother. Allergies  Allergen Reactions  . Penicillins     REACTION: Rash   Current Outpatient Medications on File Prior to Visit  Medication Sig Dispense Refill  . ALPRAZolam (XANAX) 0.5 MG tablet 1-2 tab by mouth as needed with travel 8 tablet 0  . aspirin 81 MG tablet Take 81 mg by mouth daily.      . citalopram (CELEXA) 20 MG tablet Take 1 tablet (20 mg total) by mouth daily. Annual appt due in Sept must see provider for future refills 90 tablet 1  . Dulaglutide (TRULICITY) 5.36 UY/4.0HK SOPN Inject 0.75 mg into the skin once a week. 6 mL 3  . glucose blood test strip Use as directed once daily  to check blood sugar.  Diagnosis code 250.00 100 each 11  . iron polysaccharides (NU-IRON) 150 MG capsule Take 1 capsule (150 mg total) by mouth daily. 90 capsule 1  . Lancets MISC Use as directed once daily to check blood sugar.  Diagnosis code 250.00 100 each 11  . lisinopril (ZESTRIL) 5 MG tablet Take 1 tablet (5 mg total) by mouth daily. Annual appt due in Sept must see provider for future refills 90 tablet 1  . meclizine (ANTIVERT) 12.5 MG tablet Take 1 tablet (12.5 mg total) by mouth 3 (three) times daily as  needed. 40 tablet 1  . metFORMIN (GLUCOPHAGE) 1000 MG tablet TAKE ONE TABLET BY MOUTH TWICE A DAY WITH MEALS 60 tablet 5  . nitroGLYCERIN (NITRODUR - DOSED IN MG/24 HR) 0.2 mg/hr patch Apply 1/4 patch daily to tendon for tendonitis. 30 patch 1  . pioglitazone (ACTOS) 45 MG tablet Take 1 tablet (45 mg total) by mouth daily. Annual appt due in Sept must see provider for future refills 90 tablet 1  . pravastatin (PRAVACHOL) 40 MG tablet Take 1 tablet (40 mg total) by mouth daily. Annual appt due in Sept must see provider for future refills 90 tablet 1  . scopolamine (TRANSDERM-SCOP, 1.5 MG,) 1 MG/3DAYS Place 1 patch (1.5 mg total) onto the skin every 3 (three) days. (Patient taking differently: Place 1 patch onto the skin every 3 (three) days. ONLY WHEN TRAVELING) 10 patch 12  . triamcinolone (NASACORT) 55 MCG/ACT AERO nasal inhaler Place 2 sprays into the nose daily. (Patient taking differently: Place 2 sprays into the nose as needed. ) 1 Inhaler 12  . meloxicam (MOBIC) 15 MG tablet Take 15 mg by mouth daily.     No current facility-administered medications on file prior to visit.   Review of Systems All otherwise neg per pt    Objective:   Physical Exam BP 130/70 (BP Location: Left Arm, Patient Position: Sitting, Cuff Size: Large)   Pulse 75   Temp 98.7 F (37.1 C) (Oral)   Ht 5\' 2"  (1.575 m)   Wt 193 lb (87.5 kg)   LMP  (LMP Unknown)   SpO2 98%   BMI 35.30 kg/m  VS noted,  Constitutional: Pt appears in NAD HENT: Head: NCAT.  Right Ear: External ear normal.  Left Ear: External ear normal.  Eyes: . Pupils are equal, round, and reactive to light. Conjunctivae and EOM are normal Nose: without d/c or deformity Neck: Neck supple. Gross normal ROM Cardiovascular: Normal rate and regular rhythm.   Pulmonary/Chest: Effort normal and breath sounds without rales or wheezing.  Abd:  Soft, NT, ND, + BS, no organomegaly Neurological: Pt is alert. At baseline orientation, motor grossly  intact Skin: Skin is warm. No rashes, other new lesions, no LE edema Psychiatric: Pt behavior is normal without agitation  All otherwise neg per pt Lab Results  Component Value Date   WBC 5.8 09/17/2019   HGB 13.8 09/17/2019   HCT 41.8 09/17/2019   PLT 223.0 09/17/2019   GLUCOSE 112 (H) 09/17/2019   CHOL 129 09/17/2019   TRIG 124.0 09/17/2019   HDL 45.00 09/17/2019   LDLCALC 59 09/17/2019   ALT 14 09/17/2019   AST 14 09/17/2019   NA 138 09/17/2019   K 4.2 09/17/2019   CL 106 09/17/2019   CREATININE 0.77 09/17/2019   BUN 17 09/17/2019   CO2 24 09/17/2019   TSH 2.13 09/17/2019   HGBA1C 5.6 09/17/2019   MICROALBUR <0.7 09/17/2019  Assessment & Plan:

## 2019-09-22 NOTE — Patient Instructions (Signed)
Ok to stop the iron  Please continue all other medications as before, and refills have been done if requested.  Please have the pharmacy call with any other refills you may need.  Please continue your efforts at being more active, low cholesterol diet, and weight control.  You are otherwise up to date with prevention measures today.  Please keep your appointments with your specialists as you may have planned  Please make an Appointment to return in 6 months, or sooner if needed, also with Lab Appointment for testing done 3-5 days before at the [facility] (so this is for TWO appointments - please see the scheduling desk as you leave)

## 2019-09-22 NOTE — Assessment & Plan Note (Signed)

## 2019-09-22 NOTE — Assessment & Plan Note (Signed)
Very small all < 8 mm, pt has been non smoker, but strong FH lung cancer and pt seeking f/u cT at next visit

## 2019-09-22 NOTE — Assessment & Plan Note (Signed)
stable overall by history and exam, recent data reviewed with pt, and pt to continue medical treatment as before,  to f/u any worsening symptoms or concerns  

## 2019-09-25 ENCOUNTER — Telehealth: Payer: Self-pay | Admitting: Internal Medicine

## 2019-09-25 NOTE — Telephone Encounter (Signed)
    Patient requesting refill for metFORMIN (GLUCOPHAGE) 1000 MG tablet go to [facility]  Requesting lisinopril (ZESTRIL) 5 MG tablet, pravastatin (PRAVACHOL) 40 MG tablet and pioglitazone (ACTOS) 45 MG tablet go to [facility] on file

## 2019-09-28 ENCOUNTER — Other Ambulatory Visit: Payer: Self-pay

## 2019-09-28 MED ORDER — PIOGLITAZONE HCL 45 MG PO TABS
45.0000 mg | ORAL_TABLET | Freq: Every day | ORAL | 1 refills | Status: DC
Start: 2019-09-28 — End: 2020-03-27

## 2019-09-28 MED ORDER — LISINOPRIL 5 MG PO TABS
5.0000 mg | ORAL_TABLET | Freq: Every day | ORAL | 1 refills | Status: DC
Start: 2019-09-28 — End: 2020-03-27

## 2019-09-28 MED ORDER — METFORMIN HCL 1000 MG PO TABS
1000.0000 mg | ORAL_TABLET | Freq: Two times a day (BID) | ORAL | 5 refills | Status: DC
Start: 2019-09-28 — End: 2019-09-30

## 2019-09-28 MED ORDER — PRAVASTATIN SODIUM 40 MG PO TABS
40.0000 mg | ORAL_TABLET | Freq: Every day | ORAL | 1 refills | Status: DC
Start: 2019-09-28 — End: 2020-03-27

## 2019-09-28 NOTE — Telephone Encounter (Signed)
Meds have been sent to [facility] on file.

## 2019-09-30 ENCOUNTER — Other Ambulatory Visit: Payer: Self-pay | Admitting: Internal Medicine

## 2019-12-05 ENCOUNTER — Other Ambulatory Visit: Payer: Self-pay | Admitting: Internal Medicine

## 2020-02-08 ENCOUNTER — Other Ambulatory Visit: Payer: No Typology Code available for payment source

## 2020-02-08 DIAGNOSIS — Z20822 Contact with and (suspected) exposure to covid-19: Secondary | ICD-10-CM

## 2020-02-10 LAB — SARS-COV-2, NAA 2 DAY TAT

## 2020-02-10 LAB — NOVEL CORONAVIRUS, NAA: SARS-CoV-2, NAA: DETECTED — AB

## 2020-02-11 ENCOUNTER — Telehealth: Payer: Self-pay | Admitting: Infectious Diseases

## 2020-02-11 ENCOUNTER — Telehealth (HOSPITAL_COMMUNITY): Payer: Self-pay | Admitting: Family

## 2020-02-11 NOTE — Telephone Encounter (Signed)
Called to discuss with patient about COVID-19 symptoms and the use of one of the available treatments for those with mild to moderate Covid symptoms and at a high risk of hospitalization.  Pt appears to qualify for outpatient treatment due to co-morbid conditions and/or a member of an at-risk group in accordance with the FDA Emergency Use Authorization.    Symptom onset: unknown Vaccinated: yes  Booster? Undocumented  Immunocompromised? no Qualifiers: DM, BMI> 25  Unable to reach pt - LVM and sent Mychart to assess for treatment candidacy.    Janene Madeira

## 2020-02-11 NOTE — Telephone Encounter (Signed)
Called to discuss with Traci [name] about Covid symptoms and the use of casirivimab/imdevimab, a combination monoclonal antibody infusion for those with mild to moderate Covid symptoms and at a high risk of hospitalization.     Pt is qualified for this infusion at the infusion center due to co-morbid conditions and/or a member of an at-risk group, however declines infusion at this time as she states her symptom at this time is coughing. She states she cannot afford what insurance does not cover. Offered to schedule her and allow her to cancel after she speaks to insurance but she declines. Offered oral medication which would need to be picked up today as today is day 5, but she states she cannot go to the pharmacy to get it.  Symptoms tier reviewed.  Symptoms reviewed that would warrant ED/Hospital evaluation. Preventative practices reviewed. Patient verbalized understanding. Patient advised to go to Urgent care or ED with severe symptoms.criteria for ending isolation. Preventative practices reviewed. Patient verbalized understanding    Patient Active Problem List   Diagnosis Date Noted  . Pulmonary nodules 09/22/2019  . Obstructive sleep apnea 04/03/2019  . Iron deficiency 03/19/2019  . Febrile illness 07/27/2018  . Daytime somnolence 03/13/2018  . Bilateral knee pain 05/27/2017  . Peroneal tendinitis 05/27/2017  . Right shoulder injury 05/27/2017  . Anxiety 07/29/2016  . Travel advice encounter 07/29/2016  . Eustachian tube dysfunction, left 05/01/2016  . Dizziness 11/19/2014  . Acute sinus infection 06/19/2012  . Abnormal liver function tests 07/25/2011  . Bunion, right 07/25/2011  . Vertigo 03/26/2011  . Vomiting 03/26/2011  . Preventative health care 07/30/2010  . CONTACT DERMATITIS 11/30/2009  . Allergic rhinitis 07/07/2008  . PLANTAR FASCIITIS, LEFT 08/05/2007  . Essential hypertension 07/08/2007  . Dizziness and giddiness 04/26/2007  . Hyperlipidemia 01/07/2007  . Diabetes  ([place]) 11/29/2006    Anaih Brander,NP

## 2020-02-11 NOTE — Telephone Encounter (Signed)
Called to discuss with patient about COVID-19 symptoms and the use of one of the available treatments for those with mild to moderate Covid symptoms and at a high risk of hospitalization.  Pt appears to qualify for outpatient treatment due to co-morbid conditions and/or a member of an at-risk group in accordance with the FDA Emergency Use Authorization.    Unable to reach pt - VM left. Will reattempt as she had correspondence with another team member previously.   [employer]

## 2020-03-18 ENCOUNTER — Other Ambulatory Visit (INDEPENDENT_AMBULATORY_CARE_PROVIDER_SITE_OTHER): Payer: No Typology Code available for payment source

## 2020-03-18 DIAGNOSIS — E611 Iron deficiency: Secondary | ICD-10-CM

## 2020-03-18 DIAGNOSIS — E119 Type 2 diabetes mellitus without complications: Secondary | ICD-10-CM | POA: Diagnosis not present

## 2020-03-18 LAB — COMPREHENSIVE METABOLIC PANEL
ALT: 18 U/L (ref 0–35)
AST: 18 U/L (ref 0–37)
Albumin: 4 g/dL (ref 3.5–5.2)
Alkaline Phosphatase: 51 U/L (ref 39–117)
BUN: 11 mg/dL (ref 6–23)
CO2: 26 mEq/L (ref 19–32)
Calcium: 8.9 mg/dL (ref 8.4–10.5)
Chloride: 104 mEq/L (ref 96–112)
Creatinine, Ser: 0.76 mg/dL (ref 0.40–1.20)
GFR: 86.08 mL/min (ref >60.00)
Glucose, Bld: 126 mg/dL — ABNORMAL HIGH (ref 70–99)
Potassium: 3.9 mEq/L (ref 3.5–5.1)
Sodium: 139 mEq/L (ref 135–145)
Total Bilirubin: 0.4 mg/dL (ref 0.2–1.2)
Total Protein: 6.8 g/dL (ref 6.0–8.3)

## 2020-03-18 LAB — LIPID PANEL
Cholesterol: 132 mg/dL (ref 0–200)
HDL: 50.8 mg/dL (ref >39.00)
LDL Cholesterol: 61 mg/dL (ref 0–99)
NonHDL: 81.64
Total CHOL/HDL Ratio: 3
Triglycerides: 102 mg/dL (ref 0.0–149.0)
VLDL: 20.4 mg/dL (ref 0.0–40.0)

## 2020-03-18 LAB — HEMOGLOBIN A1C: Hgb A1c MFr Bld: 5.9 % (ref 4.6–6.5)

## 2020-03-18 LAB — IRON, TOTAL/TOTAL IRON BINDING CAP
%SAT: 16 % (calc) (ref 16–45)
Iron: 58 ug/dL (ref 45–160)
TIBC: 352 mcg/dL (calc) (ref 250–450)

## 2020-03-18 LAB — TRANSFERRIN: Transferrin: 293 mg/dL (ref 212.0–360.0)

## 2020-03-18 LAB — IRON: Iron: 48 ug/dL (ref 42–145)

## 2020-03-18 LAB — FERRITIN: Ferritin: 51.3 ng/mL (ref 10.0–291.0)

## 2020-03-18 NOTE — Addendum Note (Signed)
Addended by: Trenda Moots on: 4/49/7530 07:44 AM   Modules accepted: Orders

## 2020-03-20 ENCOUNTER — Encounter: Payer: Self-pay | Admitting: Internal Medicine

## 2020-03-24 ENCOUNTER — Other Ambulatory Visit: Payer: Self-pay

## 2020-03-24 ENCOUNTER — Ambulatory Visit (INDEPENDENT_AMBULATORY_CARE_PROVIDER_SITE_OTHER): Payer: No Typology Code available for payment source | Admitting: Internal Medicine

## 2020-03-24 ENCOUNTER — Encounter: Payer: Self-pay | Admitting: Internal Medicine

## 2020-03-24 VITALS — BP 120/70 | HR 75 | Temp 98.2°F | Ht 62.0 in | Wt 204.0 lb

## 2020-03-24 DIAGNOSIS — E559 Vitamin D deficiency, unspecified: Secondary | ICD-10-CM | POA: Diagnosis not present

## 2020-03-24 DIAGNOSIS — Z0001 Encounter for general adult medical examination with abnormal findings: Secondary | ICD-10-CM

## 2020-03-24 DIAGNOSIS — I10 Essential (primary) hypertension: Secondary | ICD-10-CM

## 2020-03-24 DIAGNOSIS — R918 Other nonspecific abnormal finding of lung field: Secondary | ICD-10-CM

## 2020-03-24 DIAGNOSIS — E7800 Pure hypercholesterolemia, unspecified: Secondary | ICD-10-CM

## 2020-03-24 DIAGNOSIS — E1165 Type 2 diabetes mellitus with hyperglycemia: Secondary | ICD-10-CM

## 2020-03-24 NOTE — Progress Notes (Signed)
Patient ID: [name], female   DOB: 28-Nov-1960, 60 y.o.   MRN: [id number]         Chief Complaint:: wellness exam and low vit d, obesity, and pulmonary nodules       HPI:  Traci [name] is a 60 y.o. female here for wellness exam; has eye doctor appt mar 29.; o/w up to date with preventive referral and immunizations                           Also taking vit d well.  Pt [facility].  Has hx of pulmonary nodules by imaging.  Pt denies chest pain, increased sob or doe, wheezing, orthopnea, PND, increased LE swelling, palpitations, dizziness or syncope.   Pt denies polydipsia, polyuria  Denies new worsening neuro s/s,   Pt denies polydipsia, polyuria,   Pt denies fever, wt loss, night sweats, loss of appetite, or other constitutional symptoms  In fact, regained overe 10 lbs after stopped her programmed diet.   No other new complaints Wt Readings from Last 3 Encounters:  03/24/20 204 lb (92.5 kg)  09/22/19 193 lb (87.5 kg)  07/07/19 192 lb (87.1 kg)   BP Readings from Last 3 Encounters:  03/24/20 120/70  09/22/19 130/70  07/07/19 114/60   Immunization History  Administered Date(s) Administered  . Hepatitis B 03/06/2017  . Influenza Whole 01/07/2007, 01/11/2009, 02/07/2010  . Influenza,inj,Quad PF,6+ Mos 12/19/2012, 03/07/2017, 09/10/2017, 09/18/2018  . PFIZER(Purple Top)SARS-COV-2 Vaccination 03/15/2019, 04/15/2019  . Pneumococcal Conjugate-13 08/16/2015  . Pneumococcal Polysaccharide-23 01/06/2008  . Td 09/26/2007  . Tdap 09/18/2018   There are no preventive care reminders to display for this patient.    Past Medical History:  Diagnosis Date  . ALLERGIC RHINITIS 07/07/2008  . Allergy   . Anxiety   . CONTACT DERMATITIS 11/30/2009   resolved  . Depression   . DIABETES MELLITUS, TYPE II 11/29/2006  . Dizziness and giddiness 04/26/2007  . HYPERLIPIDEMIA 01/07/2007  . HYPERTENSION 07/08/2007  . PLANTAR FASCIITIS, LEFT 08/05/2007    resolved  . Preventative health care 07/30/2010  . Sleep apnea    CPAP nightly   Past Surgical History:  Procedure Laterality Date  . COLONOSCOPY  2016   kalplan - polyps  . COLONOSCOPY  01/2019  . MOUTH SURGERY     teeth extraction  . TUBAL LIGATION    . WISDOM TOOTH EXTRACTION      reports that she has never smoked. She has never used smokeless tobacco. She reports previous alcohol use. She reports that she does not use drugs. family history includes Bipolar disorder in her maternal aunt; Cancer in her father, maternal aunt, and mother; Depression in her mother; Diabetes in her father, maternal aunt, maternal uncle, paternal aunt, and paternal uncle; Fibroids in her maternal aunt and paternal aunt; Glaucoma in her mother; Heart disease in her father and mother; Hyperlipidemia in her father, mother, and paternal aunt; Hypertension in her father, maternal uncle, mother, and paternal aunt; Raynaud syndrome in her mother; Stroke in her father, mother, and paternal uncle; Thrombophilia in her father and paternal uncle; Thyroid disease in her paternal aunt; Varicose Veins in her mother. Allergies  Allergen Reactions  . Penicillins     REACTION: Rash   Current Outpatient Medications on File Prior to Visit  Medication Sig Dispense Refill  . ALPRAZolam (XANAX) 0.5 MG tablet 1-2 tab by mouth as needed with  travel 8 tablet 0  . aspirin 81 MG tablet Take 81 mg by mouth daily.    . citalopram (CELEXA) 20 MG tablet TAKE 1 TABLET BY MOUTH ONCE DAILY . APPOINTMENT REQUIRED FOR FUTURE REFILLS 90 tablet 2  . Dulaglutide (TRULICITY) 4.81 EH/6.3JS SOPN Inject 0.75 mg into the skin once a week. 6 mL 3  . glucose blood test strip Use as directed once daily to check blood sugar.  Diagnosis code 250.00 100 each 11  . Lancets MISC Use as directed once daily to check blood sugar.  Diagnosis code 250.00 100 each 11  . lisinopril (ZESTRIL) 5 MG tablet Take 1 tablet (5 mg total) by mouth daily. Annual appt due in  Sept must see provider for future refills 90 tablet 1  . metFORMIN (GLUCOPHAGE) 1000 MG tablet TAKE ONE TABLET BY MOUTH TWICE A DAY WITH A MEAL 60 tablet 5  . pioglitazone (ACTOS) 45 MG tablet Take 1 tablet (45 mg total) by mouth daily. Annual appt due in Sept must see provider for future refills 90 tablet 1  . pravastatin (PRAVACHOL) 40 MG tablet Take 1 tablet (40 mg total) by mouth daily. Annual appt due in Sept must see provider for future refills 90 tablet 1  . scopolamine (TRANSDERM-SCOP, 1.5 MG,) 1 MG/3DAYS Place 1 patch (1.5 mg total) onto the skin every 3 (three) days. (Patient taking differently: Place 1 patch onto the skin every 3 (three) days. ONLY WHEN TRAVELING) 10 patch 12   No current facility-administered medications on file prior to visit.        ROS:  All others reviewed and negative.  Objective        PE:  BP 120/70   Pulse 75   Temp 98.2 F (36.8 C) (Oral)   Ht 5\' 2"  (1.575 m)   Wt 204 lb (92.5 kg)   LMP  (LMP Unknown)   SpO2 98%   BMI 37.31 kg/m                 Constitutional: Pt appears in NAD               HENT: Head: NCAT.                Right Ear: External ear normal.                 Left Ear: External ear normal.                Eyes: . Pupils are equal, round, and reactive to light. Conjunctivae and EOM are normal               Nose: without d/c or deformity               Neck: Neck supple. Gross normal ROM               Cardiovascular: Normal rate and regular rhythm.                 Pulmonary/Chest: Effort normal and breath sounds without rales or wheezing.                Abd:  Soft, NT, ND, + BS, no organomegaly               Neurological: Pt is alert. At baseline orientation, motor grossly intact               Skin: Skin is warm. No rashes, no other new lesions, LE  edema - none               Psychiatric: Pt behavior is normal without agitation   Micro: none  Cardiac tracings I have personally interpreted today:  none  Pertinent Radiological  findings (summarize): CT cardiac scoring - Apr 03 2019 IMPRESSION: 1. No acute abnormality. 2. Several small pulmonary nodules, largest measuring 5 mm. These small pulmonary nodules are indeterminate. No follow-up needed if patient is low-risk (and has no known or suspected primary neoplasm). Non-contrast chest CT can be considered in 12 months if patient is high-risk. This recommendation follows the consensus statement: Guidelines for Management of Incidental Pulmonary Nodules Detected on CT Images: From the Fleischner Society 2017; Radiology 2017; 284:228-243.   Lab Results  Component Value Date   WBC 5.8 09/17/2019   HGB 13.8 09/17/2019   HCT 41.8 09/17/2019   PLT 223.0 09/17/2019   GLUCOSE 126 (H) 03/18/2020   CHOL 132 03/18/2020   TRIG 102.0 03/18/2020   HDL 50.80 03/18/2020   LDLCALC 61 03/18/2020   ALT 18 03/18/2020   AST 18 03/18/2020   NA 139 03/18/2020   K 3.9 03/18/2020   CL 104 03/18/2020   CREATININE 0.76 03/18/2020   BUN 11 03/18/2020   CO2 26 03/18/2020   TSH 2.13 09/17/2019   HGBA1C 5.9 03/18/2020   MICROALBUR <0.7 09/17/2019   Assessment/Plan:  Traci [name] is a 60 y.o. White or Caucasian [1] female with  has a past medical history of ALLERGIC RHINITIS (07/07/2008), Allergy, Anxiety, CONTACT DERMATITIS (11/30/2009), Depression, DIABETES MELLITUS, TYPE II (11/29/2006), Dizziness and giddiness (04/26/2007), HYPERLIPIDEMIA (01/07/2007), HYPERTENSION (07/08/2007), PLANTAR FASCIITIS, LEFT (08/05/2007), Preventative health care (07/30/2010), and Sleep apnea.  Encounter for well adult exam with abnormal findings Age and sex appropriate education and counseling updated with regular exercise and diet Referrals for preventative services - pt to f/u eye appt mar 29 Immunizations addressed - none needed Smoking counseling  - none needed Evidence for depression or other mood disorder - none significant Most recent labs reviewed. I have personally reviewed and have  noted: 1) the patient's medical and social history 2) The patient's current medications and supplements 3) The patient's height, weight, and BMI have been recorded in the chart   Diabetes Lab Results  Component Value Date   HGBA1C 5.9 03/18/2020   Stable, pt to continue current medical treatment trulicity, metformin, actos,  Pulmonary nodules D/w pt, declines f/u CT chest for now  Vitamin D deficiency Last vitamin D Lab Results  Component Value Date   VD25OH 35.86 03/13/2019   Stable, cont oral replacement  Essential hypertension BP Readings from Last 3 Encounters:  03/24/20 120/70  09/22/19 130/70  07/07/19 114/60   Stable, pt to continue medical treatment lisinopril 5 mg   Hyperlipidemia Lab Results  Component Value Date   LDLCALC 61 03/18/2020   Stable, pt to continue current statin pravachol 40   Followup: Return in about 6 months (around 09/24/2020).  Cathlean Cower, MD 03/27/2020 5:08 AM [facility]

## 2020-03-24 NOTE — Patient Instructions (Signed)
Please continue all other medications as before, and refills have been done if requested.  Please have the pharmacy call with any other refills you may need.  Please continue your efforts at being more active, low cholesterol diet, and weight control.  You are otherwise up to date with prevention measures today.  Please keep your appointments with your specialists as you may have planned  Please call if you would want the CT Chest (no contrast) to follow up the pulmonary nodules  Please make an Appointment to return in 6 months, or sooner if needed, also with Lab Appointment for testing done 3-5 days before at the [facility] (so this is for TWO appointments - please see the scheduling desk as you leave)  Due to the ongoing Covid 19 pandemic, our lab now requires an appointment for any labs done at our office.  If you need labs done and do not have an appointment, please call our office ahead of time to schedule before presenting to the lab for your testing.  Please make an Appointment to return in 6 months, or sooner if needed, also with Lab Appointment for testing done 3-5 days before at the [facility] (so this is for TWO appointments - please see the scheduling desk as you leave)  Due to the ongoing Covid 19 pandemic, our lab now requires an appointment for any labs done at our office.  If you need labs done and do not have an appointment, please call our office ahead of time to schedule before presenting to the lab for your testing.

## 2020-03-27 ENCOUNTER — Encounter: Payer: Self-pay | Admitting: Internal Medicine

## 2020-03-27 DIAGNOSIS — E559 Vitamin D deficiency, unspecified: Secondary | ICD-10-CM | POA: Insufficient documentation

## 2020-03-27 MED ORDER — TRULICITY 0.75 MG/0.5ML ~~LOC~~ SOAJ: 0.7500 mg | SUBCUTANEOUS | 3 refills | Status: DC

## 2020-03-27 MED ORDER — PRAVASTATIN SODIUM 40 MG PO TABS: 40.0000 mg | ORAL_TABLET | Freq: Every day | ORAL | 3 refills | Status: DC

## 2020-03-27 MED ORDER — LISINOPRIL 5 MG PO TABS: 5.0000 mg | ORAL_TABLET | Freq: Every day | ORAL | 3 refills | Status: DC

## 2020-03-27 MED ORDER — METFORMIN HCL 1000 MG PO TABS: ORAL_TABLET | ORAL | 3 refills | Status: DC

## 2020-03-27 MED ORDER — CITALOPRAM HYDROBROMIDE 20 MG PO TABS: ORAL_TABLET | ORAL | 3 refills | Status: DC

## 2020-03-27 MED ORDER — PIOGLITAZONE HCL 45 MG PO TABS
45.0000 mg | ORAL_TABLET | Freq: Every day | ORAL | 3 refills | Status: DC
Start: 2020-03-27 — End: 2021-01-27

## 2020-03-27 NOTE — Assessment & Plan Note (Signed)
Lab Results  Component Value Date   HGBA1C 5.9 03/18/2020   Stable, pt to continue current medical treatment trulicity, metformin, actos,

## 2020-03-27 NOTE — Assessment & Plan Note (Signed)
Lab Results  Component Value Date   LDLCALC 61 03/18/2020   Stable, pt to continue current statin pravachol 40

## 2020-03-27 NOTE — Assessment & Plan Note (Signed)
BP Readings from Last 3 Encounters:  03/24/20 120/70  09/22/19 130/70  07/07/19 114/60   Stable, pt to continue medical treatment lisinopril 5 mg

## 2020-03-27 NOTE — Assessment & Plan Note (Signed)
Age and sex appropriate education and counseling updated with regular exercise and diet Referrals for preventative services - pt to f/u eye appt mar 29 Immunizations addressed - none needed Smoking counseling  - none needed Evidence for depression or other mood disorder - none significant Most recent labs reviewed. I have personally reviewed and have noted: 1) the patient's medical and social history 2) The patient's current medications and supplements 3) The patient's height, weight, and BMI have been recorded in the chart

## 2020-03-27 NOTE — Assessment & Plan Note (Signed)
Last vitamin D Lab Results  Component Value Date   VD25OH 35.86 03/13/2019   Stable, cont oral replacement

## 2020-03-27 NOTE — Assessment & Plan Note (Signed)
D/w pt, declines f/u CT chest for now

## 2020-04-01 NOTE — Progress Notes (Deleted)
HPI F never smoker followed for OSA, complicated by HBP, Allergic Rhinitis, DM, Hyperlipidemia HST 09/11/1998- AHI 9.8/ hr, desaturation to 82%, body weight 201 lbs  =============================================================  04/02/19- 58 yoF never smoker followed for OSA, complicated by HBP, Allergic Rhinitis, DM, Hyperlipidemia, CPAP auto 5-15/ [employer] Download compliance 90%, AHI 2.2/ hr Body weight today 188 lbs -----f/u OSA. Patient's breathing is at her baseline.  Had fallen and broken both wrists. Now recovered.  Using vaseline in nose for dryness. Discussed CPAP humidifier.  04/04/20- 59 yoF never smoker followed for OSA, complicated by HBP, Allergic Rhinitis, DM, Hyperlipidemia, CPAP auto 5-15/ [employer] Download- Body weight today- Covid vax- Flu vax-    ROS-see HPI   + = positive Constitutional:    weight loss, night sweats, fevers, chills, fatigue, lassitude. HEENT:    headaches, difficulty swallowing, tooth/dental problems, sore throat,       sneezing, itching, ear ache, nasal congestion, post nasal drip, snoring CV:    chest pain, orthopnea, PND, +swelling in lower extremities, anasarca,                                   dizziness, palpitations Resp:   shortness of breath with exertion or at rest.                productive cough,   non-productive cough, coughing up of blood.              change in color of mucus.  wheezing.   Skin:    rash or lesions. GI:  No-   heartburn, indigestion, abdominal pain, nausea, vomiting, diarrhea,                 change in bowel habits, loss of appetite GU: dysuria, change in color of urine, no urgency or frequency.   flank pain. MS:   joint pain, stiffness, decreased range of motion, back pain. Neuro-     nothing unusual Psych:  change in mood or affect.  depression or anxiety.   memory loss.  OBJ- Physical Exam General- Alert, Oriented, Affect-appropriate, Distress- none acute, + overweight Skin- rash-none, lesions- none,  excoriation- none Lymphadenopathy- none Head- atraumatic            Eyes- Gross vision intact, PERRLA, conjunctivae and secretions clear            Ears- Hearing, canals-normal            Nose- Clear, no-Septal dev, mucus, polyps, erosion, perforation             Throat- Mallampati IV , mucosa clear , drainage- none, tonsils-, few missing teeth Neck- flexible , trachea midline, no stridor , thyroid nl, carotid no bruit Chest - symmetrical excursion , unlabored           Heart/CV- RRR , no murmur , no gallop  , no rub, nl s1 s2                           - JVD- none , edema- none, stasis changes- none, varices- none           Lung- clear to P&A, wheeze- none, cough- none , dullness-none, rub- none           Chest wall-  Abd-  Br/ Gen/ Rectal- Not done, not indicated Extrem- cyanosis- none, clubbing, none, atrophy- none, strength- nl Neuro- grossly  intact to observation

## 2020-04-04 ENCOUNTER — Ambulatory Visit: Payer: No Typology Code available for payment source | Admitting: Internal Medicine

## 2020-04-11 LAB — HM DIABETES EYE EXAM

## 2020-05-09 ENCOUNTER — Encounter: Payer: Self-pay | Admitting: Internal Medicine

## 2020-06-16 ENCOUNTER — Other Ambulatory Visit: Payer: Self-pay

## 2020-06-17 NOTE — Progress Notes (Signed)
HPI F never smoker followed for OSA, complicated by HBP, Allergic Rhinitis, DM, Hyperlipidemia HST 09/11/2018- AHI 9.8/ hr, desaturation to 82%, body weight 201 lbs  ---------------------------------------------------------------------------  04/02/19- 58 yoF never smoker followed for OSA, complicated by HBP, Allergic Rhinitis, DM, Hyperlipidemia, CPAP auto 5-15/ [employer] Download compliance 90%, AHI 2.2/ hr Body weight today 188 lbs -----f/u OSA. Patient's breathing is at her baseline.  Had fallen and broken both wrists. Now recovered.  Using vaseline in nose for dryness. Discussed CPAP humidifier.  06/20/20- 58 yoF never smoker followed for OSA, complicated by HTN,  Allergic Rhinitis, DM,2 Hyperlipidemia, Small Lung Nodules,  CPAP auto 5-15/ [employer] Download- compliance 87%, AHI 1.6/ hr Body weight today- Covid vax-2 [employer] -----Patient is feeling good overall, no concerns at this time. States she is sleeping good and machine is working good.  Download reviewed and comfort measures discussed.                                                        Hx small nodules> consider CXR next visit CT Cardiac scoring 04/03/19- Several small pulmonary nodules, largest measuring 5 mm. These small pulmonary nodules are indeterminate. No follow-up needed if patient is low-risk    ROS-see HPI   + = positive Constitutional:    weight loss, night sweats, fevers, chills, fatigue, lassitude. HEENT:    headaches, difficulty swallowing, tooth/dental problems, sore throat,       sneezing, itching, ear ache, nasal congestion, post nasal drip, snoring CV:    chest pain, orthopnea, PND, +swelling in lower extremities, anasarca,                                   dizziness, palpitations Resp:   shortness of breath with exertion or at rest.                productive cough,   non-productive cough, coughing up of blood.              change in color of mucus.  wheezing.   Skin:    rash or lesions. GI:  No-   heartburn,  indigestion, abdominal pain, nausea, vomiting, diarrhea,                 change in bowel habits, loss of appetite GU: dysuria, change in color of urine, no urgency or frequency.   flank pain. MS:   joint pain, stiffness, decreased range of motion, back pain. Neuro-     nothing unusual Psych:  change in mood or affect.  depression or anxiety.   memory loss.  OBJ- Physical Exam General- Alert, Oriented, Affect-appropriate, Distress- none acute, + overweight, ?almost Cushingoid-thick neck Skin- rash-none, lesions- none, excoriation- none Lymphadenopathy- none Head- atraumatic            Eyes- Gross vision intact, PERRLA, conjunctivae and secretions clear            Ears- Hearing, canals-normal            Nose- Clear, no-Septal dev, mucus, polyps, erosion, perforation             Throat- Mallampati IV , mucosa clear , drainage- none, tonsils-, few missing teeth Neck- flexible , trachea midline, no stridor , thyroid nl, carotid  no bruit Chest - symmetrical excursion , unlabored           Heart/CV- RRR , no murmur , no gallop  , no rub, nl s1 s2                           - JVD- none , edema- none, stasis changes- none, varices- none           Lung- clear to P&A, wheeze- none, cough- none , dullness-none, rub- none           Chest wall-  Abd-  Br/ Gen/ Rectal- Not done, not indicated Extrem- cyanosis- none, clubbing, none, atrophy- none, strength- nl Neuro- grossly intact to observation

## 2020-06-20 ENCOUNTER — Encounter: Payer: Self-pay | Admitting: Internal Medicine

## 2020-06-20 ENCOUNTER — Other Ambulatory Visit: Payer: Self-pay

## 2020-06-20 ENCOUNTER — Ambulatory Visit: Payer: No Typology Code available for payment source | Admitting: Internal Medicine

## 2020-06-20 DIAGNOSIS — G4733 Obstructive sleep apnea (adult) (pediatric): Secondary | ICD-10-CM | POA: Diagnosis not present

## 2020-06-20 NOTE — Patient Instructions (Signed)
We can continue CPAP auto 5-15 ° °Please call if we can help °

## 2020-07-06 ENCOUNTER — Ambulatory Visit: Payer: No Typology Code available for payment source | Admitting: Internal Medicine

## 2020-07-06 ENCOUNTER — Other Ambulatory Visit: Payer: Self-pay

## 2020-07-06 ENCOUNTER — Encounter: Payer: Self-pay | Admitting: Internal Medicine

## 2020-07-06 VITALS — BP 124/72 | HR 79 | Temp 98.1°F | Ht 62.0 in | Wt 205.2 lb

## 2020-07-06 DIAGNOSIS — R238 Other skin changes: Secondary | ICD-10-CM | POA: Diagnosis not present

## 2020-07-06 DIAGNOSIS — E1165 Type 2 diabetes mellitus with hyperglycemia: Secondary | ICD-10-CM

## 2020-07-06 DIAGNOSIS — R233 Spontaneous ecchymoses: Secondary | ICD-10-CM

## 2020-07-06 DIAGNOSIS — L089 Local infection of the skin and subcutaneous tissue, unspecified: Secondary | ICD-10-CM | POA: Insufficient documentation

## 2020-07-06 DIAGNOSIS — L723 Sebaceous cyst: Secondary | ICD-10-CM

## 2020-07-06 DIAGNOSIS — I10 Essential (primary) hypertension: Secondary | ICD-10-CM | POA: Diagnosis not present

## 2020-07-06 MED ORDER — DOXYCYCLINE HYCLATE 100 MG PO TABS
100.0000 mg | ORAL_TABLET | Freq: Two times a day (BID) | ORAL | 0 refills | Status: DC
Start: 2020-07-06 — End: 2020-09-28

## 2020-07-06 MED ORDER — TRAMADOL HCL 50 MG PO TABS: 50.0000 mg | ORAL_TABLET | Freq: Four times a day (QID) | ORAL | 0 refills | Status: DC | PRN

## 2020-07-06 NOTE — Progress Notes (Signed)
Patient ID: Traci [name], female   DOB: 1960/01/09, 60 y.o.   MRN: [id number]        Chief Complaint: soreness to mid chest x 2 wks       HPI:  Traci [name] is a 60 y.o. female here with c/o 2 wks onset gradually worsening red, tedner, pain and soreness at the site of an initial sebacous cyst now quite large and starting draining a few days ago.  No high fever, chills, and Pt denies other chest pain, increased sob or doe, wheezing, orthopnea, PND, increased LE swelling, palpitations, dizziness or syncope.   Pt denies polydipsia, polyuria, or new focal neuro s/s.  Also menitons a new unusual easy bruising it seems to the arms in the past few months with reaching under furniture etc but no other overt bleeding.       No other new complaints Wt Readings from Last 3 Encounters:  07/06/20 205 lb 3.2 oz (93.1 kg)  06/20/20 213 lb 3.2 oz (96.7 kg)  03/24/20 204 lb (92.5 kg)   BP Readings from Last 3 Encounters:  07/06/20 124/72  06/20/20 124/60  03/24/20 120/70         Past Medical History:  Diagnosis Date   ALLERGIC RHINITIS 07/07/2008   Allergy    Anxiety    CONTACT DERMATITIS 11/30/2009   resolved   Depression    DIABETES MELLITUS, TYPE II 11/29/2006   Dizziness and giddiness 04/26/2007   HYPERLIPIDEMIA 01/07/2007   HYPERTENSION 07/08/2007   PLANTAR FASCIITIS, LEFT 08/05/2007   resolved   Preventative health care 07/30/2010   Sleep apnea    CPAP nightly   Past Surgical History:  Procedure Laterality Date   COLONOSCOPY  2016   kalplan - polyps   COLONOSCOPY  01/2019   MOUTH SURGERY     teeth extraction   TUBAL LIGATION     WISDOM TOOTH EXTRACTION      reports that she has never smoked. She has never used smokeless tobacco. She reports previous alcohol use. She reports that she does not use drugs. family history includes Bipolar disorder in her maternal aunt; Cancer in her father, maternal aunt, and mother; Depression in her mother; Diabetes in her father, maternal aunt,  maternal uncle, paternal aunt, and paternal uncle; Fibroids in her maternal aunt and paternal aunt; Glaucoma in her mother; Heart disease in her father and mother; Hyperlipidemia in her father, mother, and paternal aunt; Hypertension in her father, maternal uncle, mother, and paternal aunt; Raynaud syndrome in her mother; Stroke in her father, mother, and paternal uncle; Thrombophilia in her father and paternal uncle; Thyroid disease in her paternal aunt; Varicose Veins in her mother. Allergies  Allergen Reactions   Penicillins     REACTION: Rash   Current Outpatient Medications on File Prior to Visit  Medication Sig Dispense Refill   ALPRAZolam (XANAX) 0.5 MG tablet 1-2 tab by mouth as needed with travel 8 tablet 0   aspirin 81 MG tablet Take 81 mg by mouth daily.     citalopram (CELEXA) 20 MG tablet TAKE 1 TABLET BY MOUTH ONCE DAILY . APPOINTMENT REQUIRED FOR FUTURE REFILLS 90 tablet 3   Dulaglutide (TRULICITY) 3.15 VV/6.1YW SOPN Inject 0.75 mg into the skin once a week. 6 mL 3   glucose blood test strip Use as directed once daily to check blood sugar.  Diagnosis code 250.00 100 each 11   Lancets MISC Use as directed once daily to check blood sugar.  Diagnosis code  250.00 100 each 11   lisinopril (ZESTRIL) 5 MG tablet Take 1 tablet (5 mg total) by mouth daily. 90 tablet 3   metFORMIN (GLUCOPHAGE) 1000 MG tablet TAKE ONE TABLET BY MOUTH TWICE A DAY WITH A MEAL 180 tablet 3   pioglitazone (ACTOS) 45 MG tablet Take 1 tablet (45 mg total) by mouth daily. Annual appt due in Sept must see provider for future refills 90 tablet 3   pravastatin (PRAVACHOL) 40 MG tablet Take 1 tablet (40 mg total) by mouth daily. Annual appt due in Sept must see provider for future refills 90 tablet 3   scopolamine (TRANSDERM-SCOP, 1.5 MG,) 1 MG/3DAYS Place 1 patch (1.5 mg total) onto the skin every 3 (three) days. (Patient taking differently: Place 1 patch onto the skin every 3 (three) days. ONLY WHEN TRAVELING) 10 patch  12   No current facility-administered medications on file prior to visit.        ROS:  All others reviewed and negative.  Objective        PE:  BP 124/72 (BP Location: Right Arm, Patient Position: Sitting, Cuff Size: Large)   Pulse 79   Temp 98.1 F (36.7 C) (Oral)   Ht 5\' 2"  (1.575 m)   Wt 205 lb 3.2 oz (93.1 kg)   LMP  (LMP Unknown)   SpO2 98%   BMI 37.53 kg/m                 Constitutional: Pt appears in NAD               HENT: Head: NCAT.                Right Ear: External ear normal.                 Left Ear: External ear normal.                Eyes: . Pupils are equal, round, and reactive to light. Conjunctivae and EOM are normal               Nose: without d/c or deformity               Neck: Neck supple. Gross normal ROM               Cardiovascular: Normal rate and regular rhythm.                 Pulmonary/Chest: Effort normal and breath sounds without rales or wheezing.                Abd:  Soft, NT, ND, + BS, no organomegaly               Neurological: Pt is alert. At baseline orientation, motor grossly intact               Skin:  LE edema - none; has at least 2 cm area red tender abscess like mass to the center mid chest mid sternal between breasts with center small pustular drainage; with at least 2-3 cm surrounding underlying subq fluctuance swelling without overlying skin change               Psychiatric: Pt behavior is normal without agitation   Micro: none  Cardiac tracings I have personally interpreted today:  none  Pertinent Radiological findings (summarize): none   Lab Results  Component Value Date   WBC 5.8 09/17/2019   HGB 13.8 09/17/2019   HCT 41.8  09/17/2019   PLT 223.0 09/17/2019   GLUCOSE 126 (H) 03/18/2020   CHOL 132 03/18/2020   TRIG 102.0 03/18/2020   HDL 50.80 03/18/2020   LDLCALC 61 03/18/2020   ALT 18 03/18/2020   AST 18 03/18/2020   NA 139 03/18/2020   K 3.9 03/18/2020   CL 104 03/18/2020   CREATININE 0.76 03/18/2020   BUN 11  03/18/2020   CO2 26 03/18/2020   TSH 2.13 09/17/2019   HGBA1C 5.9 03/18/2020   MICROALBUR <0.7 09/17/2019   Assessment/Plan:  Traci [name] is a 60 y.o. White or Caucasian [1] female with  has a past medical history of ALLERGIC RHINITIS (07/07/2008), Allergy, Anxiety, CONTACT DERMATITIS (11/30/2009), Depression, DIABETES MELLITUS, TYPE II (11/29/2006), Dizziness and giddiness (04/26/2007), HYPERLIPIDEMIA (01/07/2007), HYPERTENSION (07/08/2007), PLANTAR FASCIITIS, LEFT (08/05/2007), Preventative health care (07/30/2010), and Sleep apnea.  Infected sebaceous cyst Quite large center anterior chest, likely infected sebacous cyst; for pain control tramadol, antibx course and refer general surgury  Essential hypertension BP Readings from Last 3 Encounters:  07/06/20 124/72  06/20/20 124/60  03/24/20 120/70   Stable, pt to continue medical treatment lisinopril 5   Diabetes Lab Results  Component Value Date   HGBA1C 5.9 03/18/2020   Stable, pt to continue current medical treatment trulicity, metformin  Followup: Return if symptoms worsen or fail to improve.  Cathlean Cower, MD 07/06/2020 8:03 PM [facility]

## 2020-07-06 NOTE — Assessment & Plan Note (Signed)
Lab Results  Component Value Date   HGBA1C 5.9 03/18/2020   Stable, pt to continue current medical treatment trulicity, metformin

## 2020-07-06 NOTE — Assessment & Plan Note (Signed)
Quite large center anterior chest, likely infected sebacous cyst; for pain control tramadol, antibx course and refer general surgury

## 2020-07-06 NOTE — Assessment & Plan Note (Signed)
BP Readings from Last 3 Encounters:  07/06/20 124/72  06/20/20 124/60  03/24/20 120/70   Stable, pt to continue medical treatment lisinopril 5

## 2020-07-06 NOTE — Patient Instructions (Signed)
Please take all new medication as prescribed - the pain medication, and the antibiotic  You will be contacted regarding the referral for: General Surgury asap  Please continue all other medications as before, and refills have been done if requested.  Please have the pharmacy call with any other refills you may need.  Please keep your appointments with your specialists as you may have planned

## 2020-07-18 ENCOUNTER — Encounter: Payer: Self-pay | Admitting: Internal Medicine

## 2020-07-22 ENCOUNTER — Encounter: Payer: Self-pay | Admitting: Gastroenterology

## 2020-08-11 ENCOUNTER — Other Ambulatory Visit: Payer: Self-pay | Admitting: Internal Medicine

## 2020-08-11 NOTE — Telephone Encounter (Signed)
Please refill as per office routine med refill policy (all routine meds refilled for 3 mo or monthly per pt preference up to one year from last visit, then month to month grace period for 3 mo, then further med refills will have to be denied)  

## 2020-08-12 LAB — HM MAMMOGRAPHY

## 2020-08-15 ENCOUNTER — Ambulatory Visit: Payer: Self-pay | Admitting: Surgery

## 2020-08-25 ENCOUNTER — Other Ambulatory Visit: Payer: Self-pay | Admitting: Internal Medicine

## 2020-08-25 NOTE — Telephone Encounter (Signed)
Please refill as per office routine med refill policy (all routine meds refilled for 3 mo or monthly per pt preference up to one year from last visit, then month to month grace period for 3 mo, then further med refills will have to be denied)  

## 2020-08-30 ENCOUNTER — Other Ambulatory Visit: Payer: Self-pay | Admitting: Internal Medicine

## 2020-08-30 NOTE — Telephone Encounter (Signed)
Please refill as per office routine med refill policy (all routine meds refilled for 3 mo or monthly per pt preference up to one year from last visit, then month to month grace period for 3 mo, then further med refills will have to be denied)  

## 2020-09-01 ENCOUNTER — Encounter: Payer: Self-pay | Admitting: Internal Medicine

## 2020-09-23 ENCOUNTER — Other Ambulatory Visit (INDEPENDENT_AMBULATORY_CARE_PROVIDER_SITE_OTHER): Payer: No Typology Code available for payment source

## 2020-09-23 DIAGNOSIS — E559 Vitamin D deficiency, unspecified: Secondary | ICD-10-CM

## 2020-09-23 DIAGNOSIS — E1165 Type 2 diabetes mellitus with hyperglycemia: Secondary | ICD-10-CM

## 2020-09-23 DIAGNOSIS — R233 Spontaneous ecchymoses: Secondary | ICD-10-CM

## 2020-09-23 DIAGNOSIS — R238 Other skin changes: Secondary | ICD-10-CM

## 2020-09-23 LAB — LIPID PANEL
Cholesterol: 126 mg/dL (ref 0–200)
HDL: 45.3 mg/dL (ref >39.00)
LDL Cholesterol: 59 mg/dL (ref 0–99)
NonHDL: 80.31
Total CHOL/HDL Ratio: 3
Triglycerides: 106 mg/dL (ref 0.0–149.0)
VLDL: 21.2 mg/dL (ref 0.0–40.0)

## 2020-09-23 LAB — HEPATIC FUNCTION PANEL
ALT: 14 U/L (ref 0–35)
AST: 14 U/L (ref 0–37)
Albumin: 3.9 g/dL (ref 3.5–5.2)
Alkaline Phosphatase: 50 U/L (ref 39–117)
Bilirubin, Direct: 0.1 mg/dL (ref 0.0–0.3)
Total Bilirubin: 0.4 mg/dL (ref 0.2–1.2)
Total Protein: 6.6 g/dL (ref 6.0–8.3)

## 2020-09-23 LAB — BASIC METABOLIC PANEL
BUN: 13 mg/dL (ref 6–23)
CO2: 25 mEq/L (ref 19–32)
Calcium: 8.8 mg/dL (ref 8.4–10.5)
Chloride: 105 mEq/L (ref 96–112)
Creatinine, Ser: 0.78 mg/dL (ref 0.40–1.20)
GFR: 83.13 mL/min (ref >60.00)
Glucose, Bld: 106 mg/dL — ABNORMAL HIGH (ref 70–99)
Potassium: 4.3 mEq/L (ref 3.5–5.1)
Sodium: 139 mEq/L (ref 135–145)

## 2020-09-23 LAB — VITAMIN D 25 HYDROXY (VIT D DEFICIENCY, FRACTURES): VITD: 22.45 ng/mL — ABNORMAL LOW (ref 30.00–100.00)

## 2020-09-23 LAB — PROTIME-INR
INR: 1 ratio (ref 0.8–1.0)
Prothrombin Time: 11.1 s (ref 9.6–13.1)

## 2020-09-23 LAB — HEMOGLOBIN A1C: Hgb A1c MFr Bld: 6 % (ref 4.6–6.5)

## 2020-09-27 ENCOUNTER — Ambulatory Visit: Payer: No Typology Code available for payment source | Admitting: Internal Medicine

## 2020-09-28 ENCOUNTER — Ambulatory Visit: Payer: No Typology Code available for payment source | Admitting: Internal Medicine

## 2020-09-28 ENCOUNTER — Encounter: Payer: Self-pay | Admitting: Internal Medicine

## 2020-09-28 ENCOUNTER — Other Ambulatory Visit: Payer: Self-pay

## 2020-09-28 VITALS — BP 120/62 | HR 79 | Temp 98.3°F | Ht 62.0 in | Wt 208.0 lb

## 2020-09-28 DIAGNOSIS — J301 Allergic rhinitis due to pollen: Secondary | ICD-10-CM

## 2020-09-28 DIAGNOSIS — Z23 Encounter for immunization: Secondary | ICD-10-CM

## 2020-09-28 DIAGNOSIS — H6692 Otitis media, unspecified, left ear: Secondary | ICD-10-CM

## 2020-09-28 DIAGNOSIS — N912 Amenorrhea, unspecified: Secondary | ICD-10-CM | POA: Diagnosis not present

## 2020-09-28 DIAGNOSIS — I10 Essential (primary) hypertension: Secondary | ICD-10-CM | POA: Diagnosis not present

## 2020-09-28 DIAGNOSIS — E538 Deficiency of other specified B group vitamins: Secondary | ICD-10-CM

## 2020-09-28 DIAGNOSIS — E559 Vitamin D deficiency, unspecified: Secondary | ICD-10-CM | POA: Diagnosis not present

## 2020-09-28 DIAGNOSIS — E1165 Type 2 diabetes mellitus with hyperglycemia: Secondary | ICD-10-CM

## 2020-09-28 MED ORDER — DOXYCYCLINE HYCLATE 100 MG PO TABS: 100.0000 mg | ORAL_TABLET | Freq: Two times a day (BID) | ORAL | 0 refills | Status: DC

## 2020-09-28 NOTE — Progress Notes (Signed)
Patient ID: Traci [name], female   DOB: 1960-01-10, 60 y.o.   MRN: [id number]        Chief Complaint: follow up left ear pain, low vit d, menopause symptoms and immunizations       HPI:  Traci [name] is a 60 y.o. female here with c/o 3 days onset fever, left ear pain, pressure, muffled hearing but no chills, drainage, swelling, other HA, n/v, vertigo, tinnitus.  Not taking Vit D.  Also having concerns about menopause as increased not flashes recent months, asks for labs for next visit.  Pt denies chest pain, increased sob or doe, wheezing, orthopnea, PND, increased LE swelling, palpitations, dizziness or syncope.   Pt denies polydipsia, polyuria, or new focal neuro s/s.   Pt denies recent wt loss, night sweats, loss of appetite, or other constitutional symptoms   For flu shot, pneumovax today   Wt Readings from Last 3 Encounters:  09/28/20 208 lb (94.3 kg)  07/06/20 205 lb 3.2 oz (93.1 kg)  06/20/20 213 lb 3.2 oz (96.7 kg)   BP Readings from Last 3 Encounters:  09/28/20 120/62  07/06/20 124/72  06/20/20 124/60         Past Medical History:  Diagnosis Date   ALLERGIC RHINITIS 07/07/2008   Allergy    Anxiety    CONTACT DERMATITIS 11/30/2009   resolved   Depression    DIABETES MELLITUS, TYPE II 11/29/2006   Dizziness and giddiness 04/26/2007   HYPERLIPIDEMIA 01/07/2007   HYPERTENSION 07/08/2007   PLANTAR FASCIITIS, LEFT 08/05/2007   resolved   Preventative health care 07/30/2010   Sleep apnea    CPAP nightly   Past Surgical History:  Procedure Laterality Date   COLONOSCOPY  2016   kalplan - polyps   COLONOSCOPY  01/2019   MOUTH SURGERY     teeth extraction   TUBAL LIGATION     WISDOM TOOTH EXTRACTION      reports that she has never smoked. She has never used smokeless tobacco. She reports that she does not currently use alcohol. She reports that she does not use drugs. family history includes Bipolar disorder in her maternal aunt; Cancer in her father, maternal aunt, and  mother; Depression in her mother; Diabetes in her father, maternal aunt, maternal uncle, paternal aunt, and paternal uncle; Fibroids in her maternal aunt and paternal aunt; Glaucoma in her mother; Heart disease in her father and mother; Hyperlipidemia in her father, mother, and paternal aunt; Hypertension in her father, maternal uncle, mother, and paternal aunt; Raynaud syndrome in her mother; Stroke in her father, mother, and paternal uncle; Thrombophilia in her father and paternal uncle; Thyroid disease in her paternal aunt; Varicose Veins in her mother. Allergies  Allergen Reactions   Penicillins     REACTION: Rash   Current Outpatient Medications on File Prior to Visit  Medication Sig Dispense Refill   ALPRAZolam (XANAX) 0.5 MG tablet 1-2 tab by mouth as needed with travel 8 tablet 0   aspirin 81 MG tablet Take 81 mg by mouth daily.     citalopram (CELEXA) 20 MG tablet TAKE 1 TABLET BY MOUTH ONCE DAILY . APPOINTMENT REQUIRED FOR FUTURE REFILLS 90 tablet 3   glucose blood test strip Use as directed once daily to check blood sugar.  Diagnosis code 250.00 100 each 11   Lancets MISC Use as directed once daily to check blood sugar.  Diagnosis code 250.00 100 each 11   lisinopril (ZESTRIL) 5 MG tablet TAKE 1 TABLET  BY MOUTH ONCE DAILY . APPOINTMENT REQUIRED FOR FUTURE REFILLS 90 tablet 0   metFORMIN (GLUCOPHAGE) 1000 MG tablet TAKE ONE TABLET BY MOUTH TWICE A DAY WITH A MEAL 180 tablet 3   pioglitazone (ACTOS) 45 MG tablet Take 1 tablet (45 mg total) by mouth daily. Annual appt due in Sept must see provider for future refills 90 tablet 3   pravastatin (PRAVACHOL) 40 MG tablet Take 1 tablet (40 mg total) by mouth daily. Annual appt due in Sept must see provider for future refills 90 tablet 3   TRULICITY 2.87 OM/7.6HM SOPN INJECT 0.75MG  INTO THE SKIN ONCE A WEEK 4 mL 0   scopolamine (TRANSDERM-SCOP, 1.5 MG,) 1 MG/3DAYS Place 1 patch (1.5 mg total) onto the skin every 3 (three) days. (Patient not  taking: Reported on 09/28/2020) 10 patch 12   traMADol (ULTRAM) 50 MG tablet Take 1 tablet (50 mg total) by mouth every 6 (six) hours as needed. (Patient not taking: Reported on 09/28/2020) 30 tablet 0   No current facility-administered medications on file prior to visit.        ROS:  All others reviewed and negative.  Objective        PE:  BP 120/62 (BP Location: Left Arm, Patient Position: Sitting, Cuff Size: Large)   Pulse 79   Temp 98.3 F (36.8 C) (Oral)   Ht 5\' 2"  (1.575 m)   Wt 208 lb (94.3 kg)   LMP  (LMP Unknown)   SpO2 96%   BMI 38.04 kg/m                 Constitutional: Pt appears in NAD               HENT: Head: NCAT.                Right Ear: External ear normal.                 Left Ear: External ear normal.  Left TM with severe erythema, post effusion               Eyes: . Pupils are equal, round, and reactive to light. Conjunctivae and EOM are normal               Nose: without d/c or deformity               Neck: Neck supple. Gross normal ROM               Cardiovascular: Normal rate and regular rhythm.                 Pulmonary/Chest: Effort normal and breath sounds without rales or wheezing.                               Neurological: Pt is alert. At baseline orientation, motor grossly intact               Skin: Skin is warm. No rashes, no other new lesions, LE edema - none               Psychiatric: Pt behavior is normal without agitation   Micro: none  Cardiac tracings I have personally interpreted today:  none  Pertinent Radiological findings (summarize): none   Lab Results  Component Value Date   WBC 5.8 09/17/2019   HGB 13.8 09/17/2019   HCT 41.8 09/17/2019   PLT 223.0 09/17/2019  GLUCOSE 106 (H) 09/23/2020   CHOL 126 09/23/2020   TRIG 106.0 09/23/2020   HDL 45.30 09/23/2020   LDLCALC 59 09/23/2020   ALT 14 09/23/2020   AST 14 09/23/2020   NA 139 09/23/2020   K 4.3 09/23/2020   CL 105 09/23/2020   CREATININE 0.78 09/23/2020   BUN 13  09/23/2020   CO2 25 09/23/2020   TSH 2.13 09/17/2019   INR 1.0 09/23/2020   HGBA1C 6.0 09/23/2020   MICROALBUR <0.7 09/17/2019   Assessment/Plan:  VEGA STARE is a 60 y.o. White or Caucasian [1] female with  has a past medical history of ALLERGIC RHINITIS (07/07/2008), Allergy, Anxiety, CONTACT DERMATITIS (11/30/2009), Depression, DIABETES MELLITUS, TYPE II (11/29/2006), Dizziness and giddiness (04/26/2007), HYPERLIPIDEMIA (01/07/2007), HYPERTENSION (07/08/2007), PLANTAR FASCIITIS, LEFT (08/05/2007), Preventative health care (07/30/2010), and Sleep apnea.  Vitamin D deficiency Last vitamin D Lab Results  Component Value Date   VD25OH 22.45 (L) 09/23/2020   Low, to start oral replacement   Left otitis media Mild to mod, for antibx course,  to f/u any worsening symptoms or concerns  Essential hypertension BP Readings from Last 3 Encounters:  09/28/20 120/62  07/06/20 124/72  06/20/20 124/60   Stable, pt to continue medical treatment lisinopril 5   Diabetes Lab Results  Component Value Date   HGBA1C 6.0 09/23/2020   Stable, pt to continue current medical treatment metformin, actos, trylicity   Amenorrhea Ok for fsh and estradiol next labs,  to f/u any worsening symptoms or concerns  Followup: Return in about 6 months (around 03/28/2021).  Cathlean Cower, MD 10/01/2020 6:50 PM [facility]

## 2020-09-28 NOTE — Patient Instructions (Addendum)
You had the flu shot today, and the pneumovax today  Please take all new medication as prescribed - the antibiotic  You can also take the OTC mucinex and nasacort for sinuses  Please take OTC Vitamin D3 at 2000 units per day, indefinitely  Please continue all other medications as before, and refills have been done if requested.  Please have the pharmacy call with any other refills you may need.  Please continue your efforts at being more active, low cholesterol diet, and weight control.  Please keep your appointments with your specialists as you may have planned  Please make an Appointment to return in 6 months, or sooner if needed, also with Lab Appointment for testing done 3-5 days before at the [facility] (so this is for TWO appointments - please see the scheduling desk as you leave)  Due to the ongoing Covid 19 pandemic, our lab now requires an appointment for any labs done at our office.  If you need labs done and do not have an appointment, please call our office ahead of time to schedule before presenting to the lab for your testing.

## 2020-10-01 ENCOUNTER — Encounter: Payer: Self-pay | Admitting: Internal Medicine

## 2020-10-01 NOTE — Assessment & Plan Note (Signed)
Last vitamin D Lab Results  Component Value Date   VD25OH 22.45 (L) 09/23/2020   Low, to start oral replacement

## 2020-10-01 NOTE — Assessment & Plan Note (Signed)
Lab Results  Component Value Date   HGBA1C 6.0 09/23/2020   Stable, pt to continue current medical treatment metformin, actos, trylicity

## 2020-10-01 NOTE — Assessment & Plan Note (Signed)
BP Readings from Last 3 Encounters:  09/28/20 120/62  07/06/20 124/72  06/20/20 124/60   Stable, pt to continue medical treatment lisinopril 5

## 2020-10-01 NOTE — Assessment & Plan Note (Signed)
[place] for fsh and estradiol next labs,  to f/u any worsening symptoms or concerns

## 2020-10-01 NOTE — Assessment & Plan Note (Signed)
Mild to mod, for antibx course,  to f/u any worsening symptoms or concerns 

## 2020-10-08 ENCOUNTER — Other Ambulatory Visit: Payer: Self-pay | Admitting: Internal Medicine

## 2020-10-08 NOTE — Telephone Encounter (Signed)
Please refill as per office routine med refill policy (all routine meds to be refilled for 3 mo or monthly (per pt preference) up to one year from last visit, then month to month grace period for 3 mo, then further med refills will have to be denied) ? ?

## 2020-11-13 ENCOUNTER — Encounter: Payer: Self-pay | Admitting: Internal Medicine

## 2020-11-13 NOTE — Assessment & Plan Note (Signed)
Encourage diet, exercise 

## 2020-11-13 NOTE — Assessment & Plan Note (Signed)
Benefits from CPAP with good compliance and control Plan- continue auto 5-15 

## 2021-01-27 ENCOUNTER — Other Ambulatory Visit: Payer: Self-pay | Admitting: Internal Medicine

## 2021-01-27 NOTE — Telephone Encounter (Signed)
Please refill as per office routine med refill policy (all routine meds to be refilled for 3 mo or monthly (per pt preference) up to one year from last visit, then month to month grace period for 3 mo, then further med refills will have to be denied) ? ?

## 2021-03-08 ENCOUNTER — Other Ambulatory Visit: Payer: Self-pay | Admitting: Internal Medicine

## 2021-03-08 NOTE — Telephone Encounter (Signed)
Please refill as per office routine med refill policy (all routine meds to be refilled for 3 mo or monthly (per pt preference) up to one year from last visit, then month to month grace period for 3 mo, then further med refills will have to be denied) ? ?

## 2021-03-28 ENCOUNTER — Other Ambulatory Visit: Payer: Self-pay

## 2021-03-28 ENCOUNTER — Other Ambulatory Visit (INDEPENDENT_AMBULATORY_CARE_PROVIDER_SITE_OTHER): Payer: No Typology Code available for payment source

## 2021-03-28 DIAGNOSIS — N912 Amenorrhea, unspecified: Secondary | ICD-10-CM | POA: Diagnosis not present

## 2021-03-28 DIAGNOSIS — E559 Vitamin D deficiency, unspecified: Secondary | ICD-10-CM

## 2021-03-28 DIAGNOSIS — E538 Deficiency of other specified B group vitamins: Secondary | ICD-10-CM

## 2021-03-28 DIAGNOSIS — E1165 Type 2 diabetes mellitus with hyperglycemia: Secondary | ICD-10-CM

## 2021-03-28 LAB — CBC WITH DIFFERENTIAL/PLATELET
Basophils Absolute: 0 10*3/uL (ref 0.0–0.1)
Basophils Relative: 0.7 % (ref 0.0–3.0)
Eosinophils Absolute: 0.1 10*3/uL (ref 0.0–0.7)
Eosinophils Relative: 1.7 % (ref 0.0–5.0)
HCT: 38.5 % (ref 36.0–46.0)
Hemoglobin: 12.7 g/dL (ref 12.0–15.0)
Lymphocytes Relative: 32.8 % (ref 12.0–46.0)
Lymphs Abs: 1.8 10*3/uL (ref 0.7–4.0)
MCHC: 33 g/dL (ref 30.0–36.0)
MCV: 91.4 fl (ref 78.0–100.0)
Monocytes Absolute: 0.3 10*3/uL (ref 0.1–1.0)
Monocytes Relative: 6.5 % (ref 3.0–12.0)
Neutro Abs: 3.1 10*3/uL (ref 1.4–7.7)
Neutrophils Relative %: 58.3 % (ref 43.0–77.0)
Platelets: 222 10*3/uL (ref 150.0–400.0)
RBC: 4.21 Mil/uL (ref 3.87–5.11)
RDW: 14.1 % (ref 11.5–15.5)
WBC: 5.4 10*3/uL (ref 4.0–10.5)

## 2021-03-28 LAB — BASIC METABOLIC PANEL
BUN: 15 mg/dL (ref 6–23)
CO2: 26 mEq/L (ref 19–32)
Calcium: 9.4 mg/dL (ref 8.4–10.5)
Chloride: 108 mEq/L (ref 96–112)
Creatinine, Ser: 0.81 mg/dL (ref 0.40–1.20)
GFR: 79.17 mL/min (ref >60.00)
Glucose, Bld: 108 mg/dL — ABNORMAL HIGH (ref 70–99)
Potassium: 4.8 mEq/L (ref 3.5–5.1)
Sodium: 143 mEq/L (ref 135–145)

## 2021-03-28 LAB — URINALYSIS, ROUTINE W REFLEX MICROSCOPIC
Bilirubin Urine: NEGATIVE
Ketones, ur: NEGATIVE
Leukocytes,Ua: NEGATIVE
Nitrite: NEGATIVE
Specific Gravity, Urine: >=1.03 — AB (ref 1.000–1.030)
Total Protein, Urine: NEGATIVE
Urine Glucose: NEGATIVE
Urobilinogen, UA: 0.2 (ref 0.0–1.0)
pH: 5.5 (ref 5.0–8.0)

## 2021-03-28 LAB — LIPID PANEL
Cholesterol: 130 mg/dL (ref 0–200)
HDL: 49.1 mg/dL (ref >39.00)
LDL Cholesterol: 62 mg/dL (ref 0–99)
NonHDL: 80.6
Total CHOL/HDL Ratio: 3
Triglycerides: 92 mg/dL (ref 0.0–149.0)
VLDL: 18.4 mg/dL (ref 0.0–40.0)

## 2021-03-28 LAB — VITAMIN B12: Vitamin B-12: 196 pg/mL — ABNORMAL LOW (ref 211–911)

## 2021-03-28 LAB — HEPATIC FUNCTION PANEL
ALT: 16 U/L (ref 0–35)
AST: 15 U/L (ref 0–37)
Albumin: 4.1 g/dL (ref 3.5–5.2)
Alkaline Phosphatase: 51 U/L (ref 39–117)
Bilirubin, Direct: 0.1 mg/dL (ref 0.0–0.3)
Total Bilirubin: 0.4 mg/dL (ref 0.2–1.2)
Total Protein: 6.7 g/dL (ref 6.0–8.3)

## 2021-03-28 LAB — VITAMIN D 25 HYDROXY (VIT D DEFICIENCY, FRACTURES): VITD: 28.01 ng/mL — ABNORMAL LOW (ref 30.00–100.00)

## 2021-03-28 LAB — TSH: TSH: 1.71 u[IU]/mL (ref 0.35–5.50)

## 2021-03-28 LAB — FOLLICLE STIMULATING HORMONE: FSH: 59.7 m[IU]/mL

## 2021-03-28 LAB — HEMOGLOBIN A1C: Hgb A1c MFr Bld: 6.1 % (ref 4.6–6.5)

## 2021-03-29 LAB — ESTRADIOL: Estradiol: <15 pg/mL

## 2021-03-31 ENCOUNTER — Other Ambulatory Visit: Payer: No Typology Code available for payment source

## 2021-03-31 DIAGNOSIS — E1165 Type 2 diabetes mellitus with hyperglycemia: Secondary | ICD-10-CM

## 2021-04-03 ENCOUNTER — Other Ambulatory Visit: Payer: No Typology Code available for payment source

## 2021-04-03 DIAGNOSIS — E1165 Type 2 diabetes mellitus with hyperglycemia: Secondary | ICD-10-CM

## 2021-04-03 LAB — MICROALBUMIN / CREATININE URINE RATIO
Creatinine,U: 88.1 mg/dL
Microalb Creat Ratio: 1.2 mg/g (ref 0.0–30.0)
Microalb, Ur: 1.1 mg/dL (ref 0.0–1.9)

## 2021-04-07 ENCOUNTER — Ambulatory Visit: Payer: No Typology Code available for payment source | Admitting: Internal Medicine

## 2021-04-07 ENCOUNTER — Encounter: Payer: Self-pay | Admitting: Internal Medicine

## 2021-04-07 VITALS — BP 116/70 | HR 74 | Temp 98.6°F | Ht 62.0 in | Wt 214.0 lb

## 2021-04-07 DIAGNOSIS — E538 Deficiency of other specified B group vitamins: Secondary | ICD-10-CM

## 2021-04-07 DIAGNOSIS — I10 Essential (primary) hypertension: Secondary | ICD-10-CM

## 2021-04-07 DIAGNOSIS — E559 Vitamin D deficiency, unspecified: Secondary | ICD-10-CM | POA: Diagnosis not present

## 2021-04-07 DIAGNOSIS — E1165 Type 2 diabetes mellitus with hyperglycemia: Secondary | ICD-10-CM

## 2021-04-07 DIAGNOSIS — Z0001 Encounter for general adult medical examination with abnormal findings: Secondary | ICD-10-CM | POA: Diagnosis not present

## 2021-04-07 DIAGNOSIS — E7800 Pure hypercholesterolemia, unspecified: Secondary | ICD-10-CM

## 2021-04-07 MED ORDER — SCOPOLAMINE 1 MG/3DAYS TD PT72: 1.0000 | MEDICATED_PATCH | TRANSDERMAL | 12 refills | Status: AC

## 2021-04-07 MED ORDER — PIOGLITAZONE HCL 45 MG PO TABS: ORAL_TABLET | ORAL | 3 refills | Status: DC

## 2021-04-07 MED ORDER — OZEMPIC (0.25 OR 0.5 MG/DOSE) 2 MG/1.5ML ~~LOC~~ SOPN: 0.5000 mg | PEN_INJECTOR | SUBCUTANEOUS | 3 refills | Status: DC

## 2021-04-07 MED ORDER — LISINOPRIL 5 MG PO TABS
ORAL_TABLET | ORAL | 3 refills | Status: DC
Start: 2021-04-07 — End: 2022-05-18

## 2021-04-07 MED ORDER — PRAVASTATIN SODIUM 40 MG PO TABS: ORAL_TABLET | ORAL | 3 refills | Status: DC

## 2021-04-07 MED ORDER — CITALOPRAM HYDROBROMIDE 20 MG PO TABS: ORAL_TABLET | ORAL | 3 refills | Status: DC

## 2021-04-07 MED ORDER — METFORMIN HCL 1000 MG PO TABS: ORAL_TABLET | ORAL | 3 refills | Status: DC

## 2021-04-07 MED ORDER — ALPRAZOLAM 0.5 MG PO TABS
ORAL_TABLET | ORAL | 0 refills | Status: AC
Start: 2021-04-07 — End: ?

## 2021-04-07 NOTE — Progress Notes (Addendum)
Patient ID: Traci [name], female   DOB: 06-Feb-1960, 61 y.o.   MRN: [id number] ? ? ? ?     Chief Complaint:: wellness exam and Office Visit (Check labs and ears) ? , low b12, dm and obesity ? ?     HPI:  Traci [name] is a 61 y.o. female here for wellness exam; declines shingrix and colonoscopy, o/w up to date ?         ?              Also seen at [facility] after slip and fall with right knee injury probable ligament strain.  Sister died last wk with kidney failure and liver dz.  Not taking b12.  Not losing wt with trulicity, asks for change to ozempic.  Pt denies chest pain, increased sob or doe, wheezing, orthopnea, PND, increased LE swelling, palpitations, dizziness or syncope.   Pt denies polydipsia, polyuria, or new focal neuro s/s.   Pt denies fever, wt loss, night sweats, loss of appetite, or other constitutional symptoms   ?  ?Wt Readings from Last 3 Encounters:  ?04/07/21 214 lb (97.1 kg)  ?09/28/20 208 lb (94.3 kg)  ?07/06/20 205 lb 3.2 oz (93.1 kg)  ? ?BP Readings from Last 3 Encounters:  ?04/07/21 116/70  ?09/28/20 120/62  ?07/06/20 124/72  ? ?Immunization History  ?Administered Date(s) Administered  ? Hepatitis B 03/06/2017  ? Influenza Whole 01/07/2007, 01/11/2009, 02/07/2010  ? Influenza,inj,Quad PF,6+ Mos 12/19/2012, 03/07/2017, 09/10/2017, 09/18/2018, 09/28/2020  ? PFIZER(Purple Top)SARS-COV-2 Vaccination 03/15/2019, 04/15/2019  ? Pneumococcal Conjugate-13 08/16/2015  ? Pneumococcal Polysaccharide-23 01/06/2008, 09/28/2020  ? Td 09/26/2007  ? Tdap 09/18/2018  ? ?There are no preventive care reminders to display for this patient. ? ?  ? ?Past Medical History:  ?Diagnosis Date  ? ALLERGIC RHINITIS 07/07/2008  ? Allergy   ? Anxiety   ? CONTACT DERMATITIS 11/30/2009  ? resolved  ? Depression   ? DIABETES MELLITUS, TYPE II 11/29/2006  ? Dizziness and giddiness 04/26/2007  ? HYPERLIPIDEMIA 01/07/2007  ? HYPERTENSION 07/08/2007  ? PLANTAR FASCIITIS, LEFT 08/05/2007  ? resolved  ? Preventative health care 07/30/2010   ? Sleep apnea   ? CPAP nightly  ? ?Past Surgical History:  ?Procedure Laterality Date  ? COLONOSCOPY  2016  ? kalplan - polyps  ? COLONOSCOPY  01/2019  ? MOUTH SURGERY    ? teeth extraction  ? TUBAL LIGATION    ? WISDOM TOOTH EXTRACTION    ? ? reports that she has never smoked. She has never used smokeless tobacco. She reports that she does not currently use alcohol. She reports that she does not use drugs. ?family history includes Bipolar disorder in her maternal aunt; Cancer in her father, maternal aunt, and mother; Depression in her mother; Diabetes in her father, maternal aunt, maternal uncle, paternal aunt, and paternal uncle; Fibroids in her maternal aunt and paternal aunt; Glaucoma in her mother; Heart disease in her father and mother; Hyperlipidemia in her father, mother, and paternal aunt; Hypertension in her father, maternal uncle, mother, and paternal aunt; Raynaud syndrome in her mother; Stroke in her father, mother, and paternal uncle; Thrombophilia in her father and paternal uncle; Thyroid disease in her paternal aunt; Varicose Veins in her mother. ?Allergies  ?Allergen Reactions  ? Penicillins   ?  REACTION: Rash  ? ?Current Outpatient Medications on File Prior to Visit  ?Medication Sig Dispense Refill  ? aspirin 81 MG tablet Take 81 mg by mouth daily.    ?  glucose blood test strip Use as directed once daily to check blood sugar.  Diagnosis code 250.00 100 each 11  ? Lancets MISC Use as directed once daily to check blood sugar.  Diagnosis code 250.00 100 each 11  ? TRULICITY 1.74 YC/1.4GY SOPN INJECT 0.75 MG SUBCUTANOUSLY ONCE A WEEK 4 mL 0  ? ?No current facility-administered medications on file prior to visit.  ? ?     ROS:  All others reviewed and negative. ? ?Objective  ? ?     PE:  BP 116/70 (BP Location: Right Arm, Patient Position: Sitting, Cuff Size: Large)   Pulse 74   Temp 98.6 ?F (37 ?C) (Oral)   Ht '5\' 2"'$  (1.575 m)   Wt 214 lb (97.1 kg)   LMP  (LMP Unknown)   SpO2 98%   BMI 39.14  kg/m?  ? ?              Constitutional: Pt appears in NAD ?              HENT: Head: NCAT.  ?              Right Ear: External ear normal.   ?              Left Ear: External ear normal. Bilateral canals and TMs are clear ?              Eyes: . Pupils are equal, round, and reactive to light. Conjunctivae and EOM are normal ?              Nose: without d/c or deformity ?              Neck: Neck supple. Gross normal ROM ?              Cardiovascular: Normal rate and regular rhythm.   ?              Pulmonary/Chest: Effort normal and breath sounds without rales or wheezing.  ?              Abd:  Soft, NT, ND, + BS, no organomegaly ?              Neurological: Pt is alert. At baseline orientation, motor grossly intact ?              Skin: Skin is warm. No rashes, no other new lesions, LE edema -  ?              Psychiatric: Pt behavior is normal without agitation  ? ?Micro: none ? ?Cardiac tracings I have personally interpreted today:  none ? ?Pertinent Radiological findings (summarize): none  ? ?Lab Results  ?Component Value Date  ? WBC 5.4 03/28/2021  ? HGB 12.7 03/28/2021  ? HCT 38.5 03/28/2021  ? PLT 222.0 03/28/2021  ? GLUCOSE 108 (H) 03/28/2021  ? CHOL 130 03/28/2021  ? TRIG 92.0 03/28/2021  ? HDL 49.10 03/28/2021  ? Roundup 62 03/28/2021  ? ALT 16 03/28/2021  ? AST 15 03/28/2021  ? NA 143 03/28/2021  ? K 4.8 03/28/2021  ? CL 108 03/28/2021  ? CREATININE 0.81 03/28/2021  ? BUN 15 03/28/2021  ? CO2 26 03/28/2021  ? TSH 1.71 03/28/2021  ? INR 1.0 09/23/2020  ? HGBA1C 6.1 03/28/2021  ? MICROALBUR 1.1 04/03/2021  ? ?Assessment/Plan:  ?Traci [name] is a 61 y.o. White or Caucasian [1] female with  has a past medical history of  ALLERGIC RHINITIS (07/07/2008), Allergy, Anxiety, CONTACT DERMATITIS (11/30/2009), Depression, DIABETES MELLITUS, TYPE II (11/29/2006), Dizziness and giddiness (04/26/2007), HYPERLIPIDEMIA (01/07/2007), HYPERTENSION (07/08/2007), PLANTAR FASCIITIS, LEFT (08/05/2007), Preventative health care  (07/30/2010), and Sleep apnea. ? ?Vitamin D deficiency ?Last vitamin D ?Lab Results  ?Component Value Date  ? VD25OH 28.01 (L) 03/28/2021  ? ?Low, to start oral replacement ? ? ?Encounter for well adult exam with abnormal findings ?Age and sex appropriate education and counseling updated with regular exercise and diet ?Referrals for preventative services - for colonoscopy ?Immunizations addressed -declines shingrix ?Smoking counseling  - none needed ?Evidence for depression or other mood disorder - chronc anxiety no change ?Most recent labs reviewed. ?I have personally reviewed and have noted: ?1) the patient's medical and social history ?2) The patient's current medications and supplements ?3) The patient's height, weight, and BMI have been recorded in the chart ? ? ?Morbid (severe) obesity due to excess calories ([place]) ?Ok for change trulicity to ozempic if ok with insurance, for increased activity and lower calories ? ?Hyperlipidemia ?Lab Results  ?Component Value Date  ? [place] 62 03/28/2021  ? ?Stable, pt to continue current statin pravachol 40 ? ? ?Essential hypertension ?BP Readings from Last 3 Encounters:  ?04/07/21 116/70  ?09/28/20 120/62  ?07/06/20 124/72  ? ?Stable, pt to continue medical treatment lisinopril ? ? ?Diabetes ?Lab Results  ?Component Value Date  ? HGBA1C 6.1 03/28/2021  ? ?Stable, pt to continue current medical treatment except change trulicity to ozempic if ok with insurance ? ? ?B12 deficiency ?Lab Results  ?Component Value Date  ? VITAMINB12 196 (L) 03/28/2021  ? ?Low, to start oral replacement - b12 1000 mcg qd ? ?Followup: Return in about 6 months (around 10/07/2021). ? ?Cathlean Cower, MD 04/08/2021 5:43 PM ?[place] ?[place] ?Internal Medicine ?

## 2021-04-07 NOTE — Patient Instructions (Addendum)
Please consider the shingles shot if covered by your insurance ? ?Please remember your colonoscopy for after your [place] Trip this summer. ? ?Ok to change to ozempic from the trulicity if ok with your insurance ? ?Ok to start the B12 1000 mcg per day ? ?Please continue all other medications as before, and refills have been done if requested. ? ?Please have the pharmacy call with any other refills you may need. ? ?Please continue your efforts at being more active, low cholesterol diet, and weight control. ? ?You are otherwise up to date with prevention measures today. ? ?Please keep your appointments with your specialists as you may have planned ? ?Please make an Appointment to return in 6 months, or sooner if needed, also with Lab Appointment for testing done 3-5 days before at the [place] (so this is for TWO appointments - please see the scheduling desk as you leave) ? ?Due to the ongoing Covid 19 pandemic, our lab now requires an appointment for any labs done at our office.  If you need labs done and do not have an appointment, please call our office ahead of time to schedule before presenting to the lab for your testing. ? ? ? ? ?

## 2021-04-07 NOTE — Assessment & Plan Note (Signed)
Last vitamin D ?Lab Results  ?Component Value Date  ? VD25OH 28.01 (L) 03/28/2021  ? ?Low, to start oral replacement ? ?

## 2021-04-08 ENCOUNTER — Encounter: Payer: Self-pay | Admitting: Internal Medicine

## 2021-04-08 NOTE — Assessment & Plan Note (Signed)
BP Readings from Last 3 Encounters:  ?04/07/21 116/70  ?09/28/20 120/62  ?07/06/20 124/72  ? ?Stable, pt to continue medical treatment lisinopril ? ?

## 2021-04-08 NOTE — Assessment & Plan Note (Signed)
Lab Results  ?Component Value Date  ? VITAMINB12 196 (L) 03/28/2021  ? ?Low, to start oral replacement - b12 1000 mcg qd ? ?

## 2021-04-08 NOTE — Addendum Note (Signed)
Addended by: Biagio Borg on: 04/08/2021 05:45 PM ? ? Modules accepted: Orders ? ?

## 2021-04-08 NOTE — Assessment & Plan Note (Signed)
Age and sex appropriate education and counseling updated with regular exercise and diet ?Referrals for preventative services - for colonoscopy ?Immunizations addressed -declines shingrix ?Smoking counseling  - none needed ?Evidence for depression or other mood disorder - chronc anxiety no change ?Most recent labs reviewed. ?I have personally reviewed and have noted: ?1) the patient's medical and social history ?2) The patient's current medications and supplements ?3) The patient's height, weight, and BMI have been recorded in the chart ? ?

## 2021-04-08 NOTE — Assessment & Plan Note (Signed)
Lab Results  ?Component Value Date  ? HGBA1C 6.1 03/28/2021  ? ?Stable, pt to continue current medical treatment except change trulicity to ozempic if ok with insurance ? ?

## 2021-04-08 NOTE — Assessment & Plan Note (Signed)
Ok for change trulicity to ozempic if ok with insurance, for increased activity and lower calories ?

## 2021-04-08 NOTE — Assessment & Plan Note (Signed)
Lab Results  ?Component Value Date  ? [place] 62 03/28/2021  ? ?Stable, pt to continue current statin pravachol 40 ? ?

## 2021-04-12 LAB — HM DIABETES EYE EXAM

## 2021-05-08 IMAGING — CT CT CARDIAC CORONARY ARTERY CALCIUM SCORE
3 series · 14 of 20 positions shown, 15 images · non-contrast
Comparison: None.
COMPARISON: None.

Addendum:
EXAM:
OVER-READ INTERPRETATION  CT CHEST

The following report is an over-read performed by radiologist Dr.
Maricella Tran [REDACTED] on 04/03/2019. This over-read
does not include interpretation of cardiac or coronary anatomy or
pathology. The coronary calcium score interpretation by the
cardiologist is attached.
CLINICAL DATA: Risk stratification
Coronary Calcium Score
TECHNIQUE: The patient was scanned on a Siemens Force scanner. Axial
non-contrast 3 mm slices were carried out through the heart. The
data set was analyzed on a dedicated work station and scored using
the Agatson method.

[Series 2: casc 3.0 bv41 2 bestdiast 71 % · axial · 0.37mm/px · z∈[-184,-115]mm · 4 of 39 slices shown, 5 images]
[im 8/39  vessel]
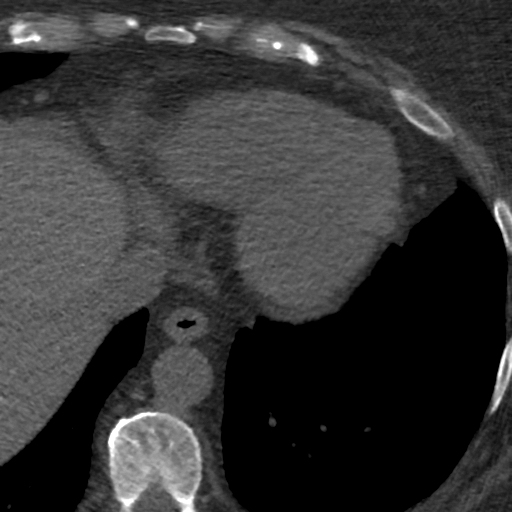
[im 8/39  lung]
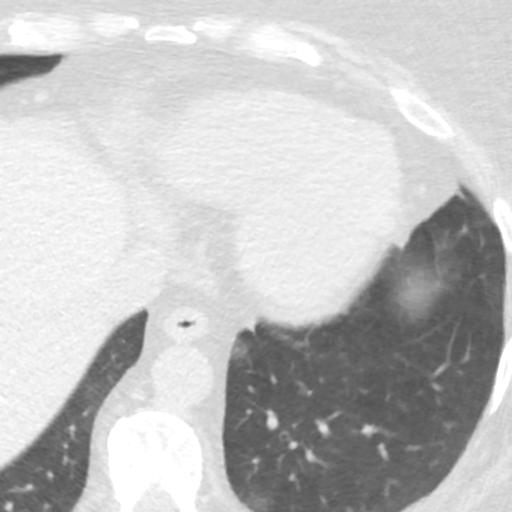
[im 16/39  vessel]
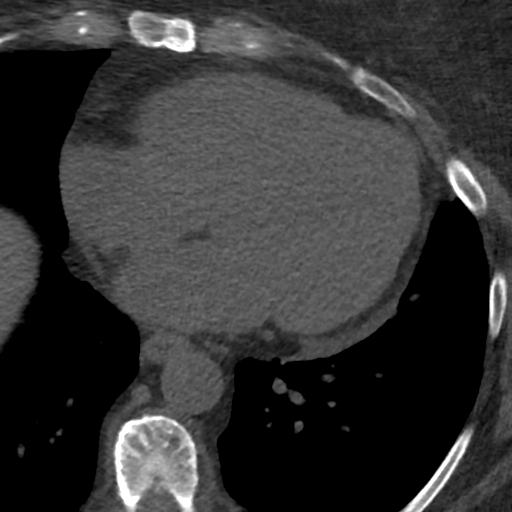
[im 23/39  vessel]
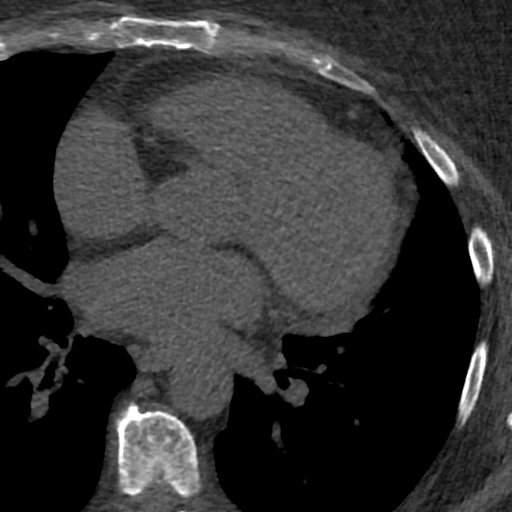
[im 31/39  vessel]
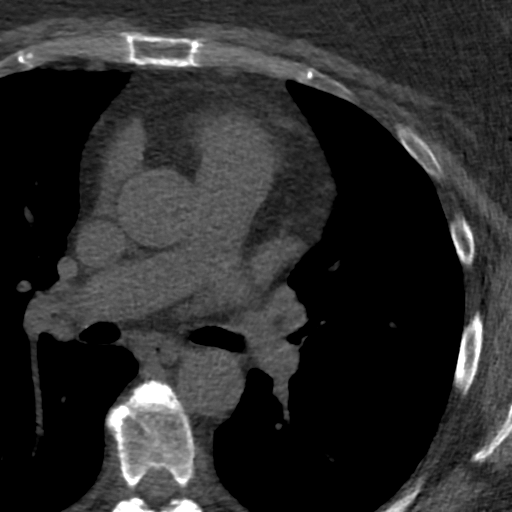

[Series 3: lung 72 % · axial · 0.64mm/px · z∈[-186,-112]mm · 5 of 39 slices shown]
[im 7/39  lung]
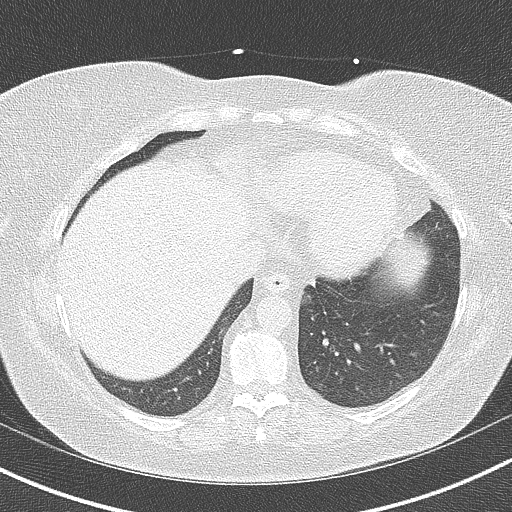
[im 13/39  lung]
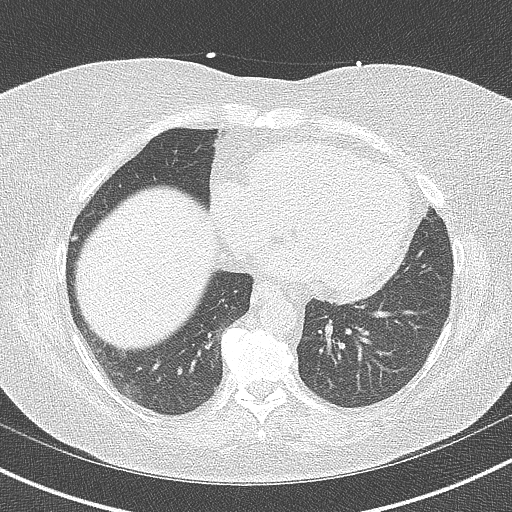
[im 20/39  lung]
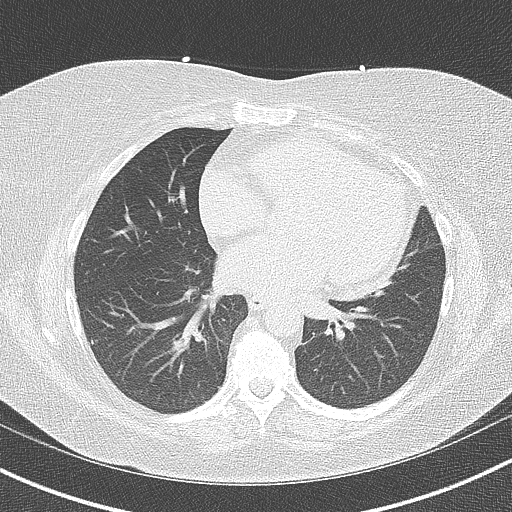
[im 26/39  lung]
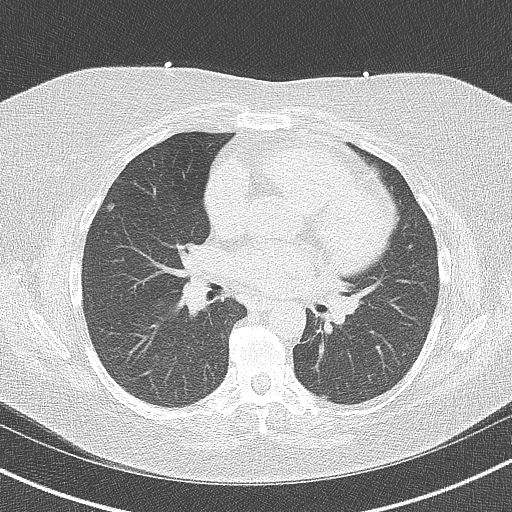
[im 32/39  lung]
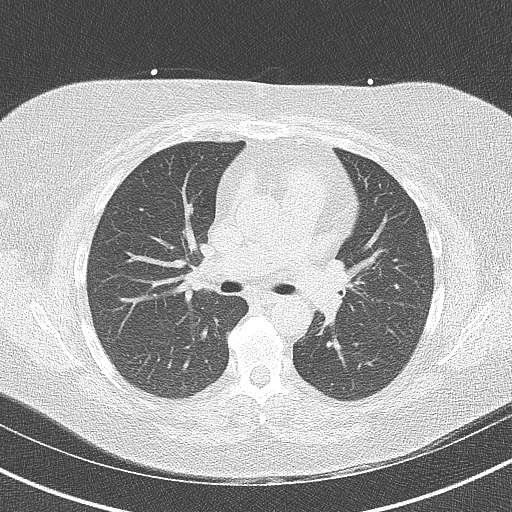

[Series 4: lung st 72 % · axial · 0.64mm/px · z∈[-186,-112]mm · 5 of 39 slices shown]
[im 7/39  lung]
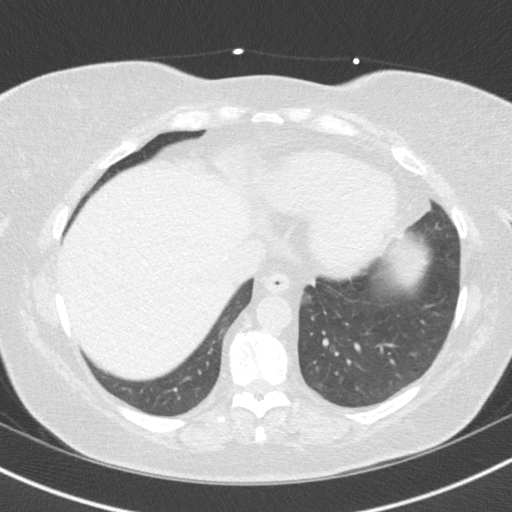
[im 13/39  lung]
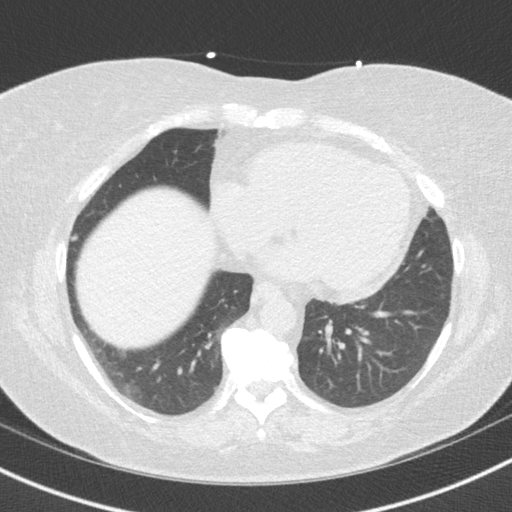
[im 20/39  lung]
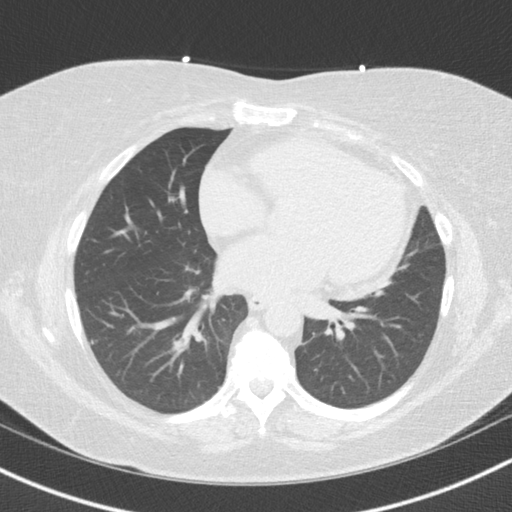
[im 26/39  lung]
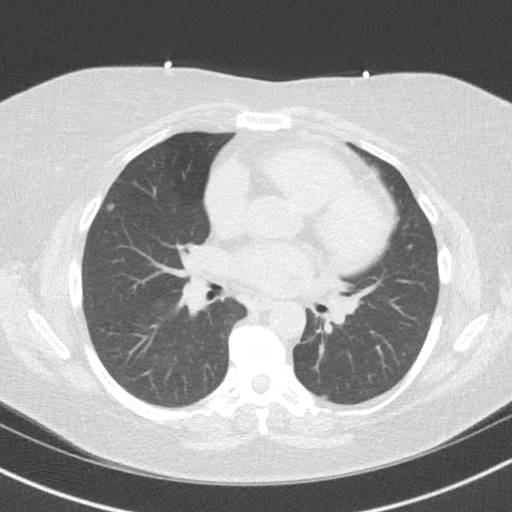
[im 32/39  lung]
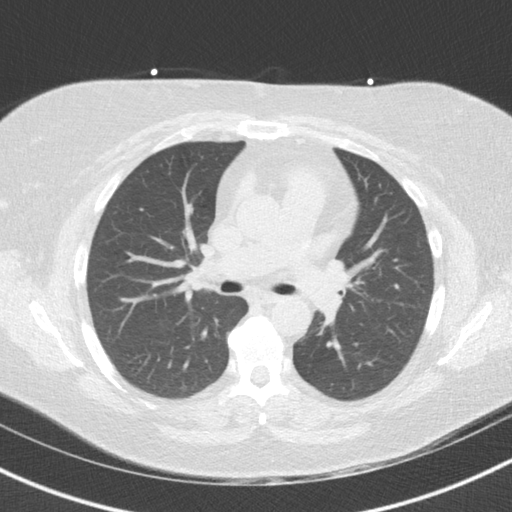

[14 of 20 positions shown; findings below may reference images not displayed]

FINDINGS: Vascular: Ascending thoracic aorta measures 2.8 cm. Descending
thoracic aorta measures 2.3 cm. Minimal pericardial fluid.

Mediastinum/Nodes: Mediastinal structures are unremarkable.

Lungs/Pleura: 5 mm nodule in the right middle lobe along the right
minor fissure on sequence 3, image 14. 3 mm nodule in the right
upper lobe on image 10. 5 mm peripheral nodule in the right middle
lobe on image 27. Tiny peripheral nodular density in the right lower
lobe on image 20. Additional small pleural-based nodules in the
lungs. 3 mm nodule near the left major fissure on image 11. No
significant airspace disease or consolidation in the visualized
lungs. No large pleural effusions.

Upper Abdomen: Images of the upper abdomen are unremarkable.

Musculoskeletal: 1.3 cm round structure in the anterior chest
subcutaneous tissues just below the skin. This could represent a
small sebaceous cyst. No acute bone abnormality.
IMPRESSION: 1. No acute abnormality.
2. Several small pulmonary nodules, largest measuring 5 mm. These
small pulmonary nodules are indeterminate. No follow-up needed if
patient is low-risk (and has no known or suspected primary
neoplasm). Non-contrast chest CT can be considered in 12 months if
patient is high-risk. This recommendation follows the consensus
statement: Guidelines for Management of Incidental Pulmonary Nodules
Detected on CT Images: From the [HOSPITAL] 3127; Radiology
FINDINGS: Non-cardiac: See separate report from [REDACTED].

Ascending Aorta: Normal Caliber.  No calcifications.

Pericardium: Normal

Coronary arteries: Normal coronary origins.
IMPRESSION: Coronary calcium score of 0. This was 0 percentile for age and sex
matched control.

Eii Agus Sentena

*** End of Addendum ***
EXAM:
OVER-READ INTERPRETATION  CT CHEST

The following report is an over-read performed by radiologist Dr.
Maricella Tran [REDACTED] on 04/03/2019. This over-read
does not include interpretation of cardiac or coronary anatomy or
pathology. The coronary calcium score interpretation by the
cardiologist is attached.
FINDINGS: Vascular: Ascending thoracic aorta measures 2.8 cm. Descending
thoracic aorta measures 2.3 cm. Minimal pericardial fluid.

Mediastinum/Nodes: Mediastinal structures are unremarkable.

Lungs/Pleura: 5 mm nodule in the right middle lobe along the right
minor fissure on sequence 3, image 14. 3 mm nodule in the right
upper lobe on image 10. 5 mm peripheral nodule in the right middle
lobe on image 27. Tiny peripheral nodular density in the right lower
lobe on image 20. Additional small pleural-based nodules in the
lungs. 3 mm nodule near the left major fissure on image 11. No
significant airspace disease or consolidation in the visualized
lungs. No large pleural effusions.

Upper Abdomen: Images of the upper abdomen are unremarkable.

Musculoskeletal: 1.3 cm round structure in the anterior chest
subcutaneous tissues just below the skin. This could represent a
small sebaceous cyst. No acute bone abnormality.
IMPRESSION: 1. No acute abnormality.
2. Several small pulmonary nodules, largest measuring 5 mm. These
small pulmonary nodules are indeterminate. No follow-up needed if
patient is low-risk (and has no known or suspected primary
neoplasm). Non-contrast chest CT can be considered in 12 months if
patient is high-risk. This recommendation follows the consensus
statement: Guidelines for Management of Incidental Pulmonary Nodules
Detected on CT Images: From the [HOSPITAL] 3127; Radiology

## 2021-06-22 ENCOUNTER — Encounter: Payer: Self-pay | Admitting: Internal Medicine

## 2021-06-24 NOTE — Progress Notes (Signed)
HPI F never smoker followed for OSA, complicated by HBP, Allergic Rhinitis, DM, Hyperlipidemia HST 09/11/2018- AHI 9.8/ hr, desaturation to 82%, body weight 201 lbs  ---------------------------------------------------------------------------   06/20/20- 58 yoF never smoker followed for OSA, complicated by HTN,  Allergic Rhinitis, DM,2 Hyperlipidemia, Small Lung Nodules,  CPAP auto 5-15/ [employer] Download- compliance 87%, AHI 1.6/ hr Body weight today- Covid vax-2 [employer] -----Patient is feeling good overall, no concerns at this time. States she is sleeping good and machine is working good.  Download reviewed and comfort measures discussed.                                                        Hx small nodules> consider CXR next visit CT Cardiac scoring 04/03/19- Several small pulmonary nodules, largest measuring 5 mm. These small pulmonary nodules are indeterminate. No follow-up needed if patient is low-risk   06/26/21-  61 yoF never smoker followed for OSA, complicated by HTN,  Allergic Rhinitis, DM,2 Hyperlipidemia, Small Lung Nodules,  CPAP auto 5-15/ [employer] Body weight today-210 lbs Covid vax-2 [employer] Download- compliance 90%, AHI 1.2/ hr Download reviewed. Doing well with CPAP.  Discussed lung nodules and will f.u  CXR.   ROS-see HPI   + = positive Constitutional:    weight loss, night sweats, fevers, chills, fatigue, lassitude. HEENT:    headaches, difficulty swallowing, tooth/dental problems, sore throat,       sneezing, itching, ear ache, nasal congestion, post nasal drip, snoring CV:    chest pain, orthopnea, PND, +swelling in lower extremities, anasarca,                                   dizziness, palpitations Resp:   shortness of breath with exertion or at rest.                productive cough,   non-productive cough, coughing up of blood.              change in color of mucus.  wheezing.   Skin:    rash or lesions. GI:  No-   heartburn, indigestion, abdominal pain, nausea,  vomiting, diarrhea,                 change in bowel habits, loss of appetite GU: dysuria, change in color of urine, no urgency or frequency.   flank pain. MS:   joint pain, stiffness, decreased range of motion, back pain. Neuro-     nothing unusual Psych:  change in mood or affect.  depression or anxiety.   memory loss.  OBJ- Physical Exam General- Alert, Oriented, Affect-appropriate, Distress- none acute, + overweight, ?almost Cushingoid-thick neck Skin- rash-none, lesions- none, excoriation- none Lymphadenopathy- none Head- atraumatic            Eyes- Gross vision intact, PERRLA, conjunctivae and secretions clear            Ears- Hearing, canals-normal            Nose- Clear, no-Septal dev, mucus, polyps, erosion, perforation             Throat- Mallampati IV , mucosa clear , drainage- none, tonsils-, few missing teeth Neck- flexible , trachea midline, no stridor , thyroid nl, carotid no bruit Chest -  symmetrical excursion , unlabored           Heart/CV- RRR , no murmur , no gallop  , no rub, nl s1 s2                           - JVD- none , edema- none, stasis changes- none, varices- none           Lung- clear to P&A, wheeze- none, cough- none , dullness-none, rub- none           Chest wall-  Abd-  Br/ Gen/ Rectal- Not done, not indicated Extrem- cyanosis- none, clubbing, none, atrophy- none, strength- nl Neuro- grossly intact to observation

## 2021-06-26 ENCOUNTER — Ambulatory Visit: Payer: No Typology Code available for payment source | Admitting: Internal Medicine

## 2021-06-26 ENCOUNTER — Encounter: Payer: Self-pay | Admitting: Internal Medicine

## 2021-06-26 ENCOUNTER — Ambulatory Visit (INDEPENDENT_AMBULATORY_CARE_PROVIDER_SITE_OTHER): Payer: No Typology Code available for payment source

## 2021-06-26 VITALS — BP 118/60 | HR 71 | Temp 98.2°F | Ht 63.0 in | Wt 210.6 lb

## 2021-06-26 DIAGNOSIS — G4733 Obstructive sleep apnea (adult) (pediatric): Secondary | ICD-10-CM

## 2021-06-26 DIAGNOSIS — R918 Other nonspecific abnormal finding of lung field: Secondary | ICD-10-CM

## 2021-06-26 NOTE — Patient Instructions (Signed)
We can continue CPAP auto 5-15  You might look on-line at sites like CPAP.com if you are interested in travel CPAP machines like Air Mini and Transcend. You would want a machine that can be set on autopap 5-15 like your present machine. You would want mask of choice, hoses, filters and supplies.   Please call if we can help

## 2021-06-27 ENCOUNTER — Telehealth: Payer: Self-pay | Admitting: Internal Medicine

## 2021-06-27 NOTE — Telephone Encounter (Signed)
Called patient and discussed chest xray results. Patient verbalized understanding. Nothing further needed

## 2021-08-06 ENCOUNTER — Encounter: Payer: Self-pay | Admitting: Internal Medicine

## 2021-08-06 NOTE — Assessment & Plan Note (Signed)
Will update with CXR. CT can follow if indicated.

## 2021-08-06 NOTE — Assessment & Plan Note (Signed)
Benefits from CPAP, sleeps better Plan- continue auto 5-15

## 2021-10-03 ENCOUNTER — Other Ambulatory Visit (INDEPENDENT_AMBULATORY_CARE_PROVIDER_SITE_OTHER): Payer: No Typology Code available for payment source

## 2021-10-03 DIAGNOSIS — E559 Vitamin D deficiency, unspecified: Secondary | ICD-10-CM | POA: Diagnosis not present

## 2021-10-03 DIAGNOSIS — E538 Deficiency of other specified B group vitamins: Secondary | ICD-10-CM | POA: Diagnosis not present

## 2021-10-03 DIAGNOSIS — E1165 Type 2 diabetes mellitus with hyperglycemia: Secondary | ICD-10-CM | POA: Diagnosis not present

## 2021-10-03 LAB — BASIC METABOLIC PANEL
BUN: 13 mg/dL (ref 6–23)
CO2: 26 mEq/L (ref 19–32)
Calcium: 8.9 mg/dL (ref 8.4–10.5)
Chloride: 106 mEq/L (ref 96–112)
Creatinine, Ser: 0.81 mg/dL (ref 0.40–1.20)
GFR: 78.88 mL/min (ref >60.00)
Glucose, Bld: 106 mg/dL — ABNORMAL HIGH (ref 70–99)
Potassium: 3.7 mEq/L (ref 3.5–5.1)
Sodium: 138 mEq/L (ref 135–145)

## 2021-10-03 LAB — VITAMIN B12: Vitamin B-12: 882 pg/mL (ref 211–911)

## 2021-10-03 LAB — HEPATIC FUNCTION PANEL
ALT: 13 U/L (ref 0–35)
AST: 13 U/L (ref 0–37)
Albumin: 3.8 g/dL (ref 3.5–5.2)
Alkaline Phosphatase: 50 U/L (ref 39–117)
Bilirubin, Direct: 0.1 mg/dL (ref 0.0–0.3)
Total Bilirubin: 0.4 mg/dL (ref 0.2–1.2)
Total Protein: 6.5 g/dL (ref 6.0–8.3)

## 2021-10-03 LAB — HEMOGLOBIN A1C: Hgb A1c MFr Bld: 5.9 % (ref 4.6–6.5)

## 2021-10-03 LAB — LIPID PANEL
Cholesterol: 117 mg/dL (ref 0–200)
HDL: 42.5 mg/dL (ref >39.00)
LDL Cholesterol: 57 mg/dL (ref 0–99)
NonHDL: 74.35
Total CHOL/HDL Ratio: 3
Triglycerides: 87 mg/dL (ref 0.0–149.0)
VLDL: 17.4 mg/dL (ref 0.0–40.0)

## 2021-10-03 LAB — VITAMIN D 25 HYDROXY (VIT D DEFICIENCY, FRACTURES): VITD: 34 ng/mL (ref 30.00–100.00)

## 2021-10-10 ENCOUNTER — Ambulatory Visit: Payer: No Typology Code available for payment source | Admitting: Internal Medicine

## 2021-10-11 ENCOUNTER — Encounter: Payer: Self-pay | Admitting: Internal Medicine

## 2021-10-11 ENCOUNTER — Ambulatory Visit: Payer: No Typology Code available for payment source | Admitting: Internal Medicine

## 2021-10-11 VITALS — BP 122/68 | HR 79 | Ht 62.0 in | Wt 207.0 lb

## 2021-10-11 DIAGNOSIS — E559 Vitamin D deficiency, unspecified: Secondary | ICD-10-CM | POA: Diagnosis not present

## 2021-10-11 DIAGNOSIS — Z23 Encounter for immunization: Secondary | ICD-10-CM | POA: Diagnosis not present

## 2021-10-11 DIAGNOSIS — E1165 Type 2 diabetes mellitus with hyperglycemia: Secondary | ICD-10-CM

## 2021-10-11 DIAGNOSIS — E7800 Pure hypercholesterolemia, unspecified: Secondary | ICD-10-CM

## 2021-10-11 DIAGNOSIS — I10 Essential (primary) hypertension: Secondary | ICD-10-CM | POA: Diagnosis not present

## 2021-10-11 MED ORDER — SEMAGLUTIDE (1 MG/DOSE) 4 MG/3ML ~~LOC~~ SOPN: 1.0000 mg | PEN_INJECTOR | SUBCUTANEOUS | 3 refills | Status: DC

## 2021-10-11 NOTE — Patient Instructions (Signed)
You had the flu shot today  Ok to increase the ozempic to 1 mg weekly  Ok to STOP the actos (pioglitazone)  Ok to increase the Vitamin D to 2000 units per day  Please continue all other medications as before, and refills have been done if requested.  Please have the pharmacy call with any other refills you may need.  Please continue your efforts at being more active, low cholesterol diet, and weight control.  Please keep your appointments with your specialists as you may have planned  Please make an Appointment to return in 6 months, or sooner if needed, also with Lab Appointment for testing done 3-5 days before at the [place] (so this is for TWO appointments - please see the scheduling desk as you leave)

## 2021-10-11 NOTE — Progress Notes (Signed)
Patient ID: Traci [name], female   DOB: [birth year], 61 y.o.   MRN: [id number]        Chief Complaint: follow up HTN, HLD and hyperglycemia       HPI:  Traci [name] is a 61 y.o. female here overall doing well, Pt denies chest pain, increased sob or doe, wheezing, orthopnea, PND, increased LE swelling, palpitations, dizziness or syncope.   Pt denies polydipsia, polyuria, or new focal neuro s/s.    Pt denies fever, wt loss, night sweats, loss of appetite, or other constitutional symptoms  Losing wt.with the ozempic. Wt Readings from Last 3 Encounters:  10/11/21 207 lb (93.9 kg)  06/26/21 210 lb 9.6 oz (95.5 kg)  04/07/21 214 lb (97.1 kg)   BP Readings from Last 3 Encounters:  10/11/21 122/68  06/26/21 118/60  04/07/21 116/70         Past Medical History:  Diagnosis Date   ALLERGIC RHINITIS 07/07/2008   Allergy    Anxiety    CONTACT DERMATITIS 11/30/2009   resolved   Depression    DIABETES MELLITUS, TYPE II 11/29/2006   Dizziness and giddiness 04/26/2007   HYPERLIPIDEMIA 01/07/2007   HYPERTENSION 07/08/2007   PLANTAR FASCIITIS, LEFT 08/05/2007   resolved   Preventative health care 07/30/2010   Sleep apnea    CPAP nightly   Past Surgical History:  Procedure Laterality Date   COLONOSCOPY  2016   kalplan - polyps   COLONOSCOPY  01/2019   MOUTH SURGERY     teeth extraction   TUBAL LIGATION     WISDOM TOOTH EXTRACTION      reports that she has never smoked. She has never used smokeless tobacco. She reports that she does not currently use alcohol. She reports that she does not use drugs. family history includes Bipolar disorder in her maternal aunt; Cancer in her father, maternal aunt, and mother; Depression in her mother; Diabetes in her father, maternal aunt, maternal uncle, paternal aunt, and paternal uncle; Fibroids in her maternal aunt and paternal aunt; Glaucoma in her mother; Heart disease in her father and mother; Hyperlipidemia in her father, mother, and paternal aunt;  Hypertension in her father, maternal uncle, mother, and paternal aunt; Raynaud syndrome in her mother; Stroke in her father, mother, and paternal uncle; Thrombophilia in her father and paternal uncle; Thyroid disease in her paternal aunt; Varicose Veins in her mother. Allergies  Allergen Reactions   Penicillins     REACTION: Rash   Current Outpatient Medications on File Prior to Visit  Medication Sig Dispense Refill   ALPRAZolam (XANAX) 0.5 MG tablet 1-2 tab by mouth as needed with travel 8 tablet 0   aspirin 81 MG tablet Take 81 mg by mouth daily.     citalopram (CELEXA) 20 MG tablet TAKE 1 TABLET BY MOUTH ONCE DAILY . 90 tablet 3   glucose blood test strip Use as directed once daily to check blood sugar.  Diagnosis code 250.00 100 each 11   Lancets MISC Use as directed once daily to check blood sugar.  Diagnosis code 250.00 100 each 11   lisinopril (ZESTRIL) 5 MG tablet TAKE 1 TABLET BY MOUTH ONCE DAILY . APPOINTMENT REQUIRED FOR FUTURE REFILLS 90 tablet 3   metFORMIN (GLUCOPHAGE) 1000 MG tablet TAKE 1 TABLET BY MOUTH TWICE DAILY WITH A MEAL 180 tablet 3   pravastatin (PRAVACHOL) 40 MG tablet TAKE 1 TABLET BY MOUTH ONCE DAILY . APPOINTMENT REQUIRED FOR FUTURE REFILLS ANNUAL  APPT  DUE  IN  SEPT 90 tablet 3   scopolamine (TRANSDERM-SCOP, 1.5 MG,) 1 MG/3DAYS Place 1 patch (1.5 mg total) onto the skin every 3 (three) days. 10 patch 12   No current facility-administered medications on file prior to visit.        ROS:  All others reviewed and negative.  Objective        PE:  BP 122/68   Pulse 79   Ht '5\' 2"'$  (1.575 m)   Wt 207 lb (93.9 kg)   LMP  (LMP Unknown)   SpO2 97%   BMI 37.86 kg/m                 Constitutional: Pt appears in NAD               HENT: Head: NCAT.                Right Ear: External ear normal.                 Left Ear: External ear normal.                Eyes: . Pupils are equal, round, and reactive to light. Conjunctivae and EOM are normal               Nose:  without d/c or deformity               Neck: Neck supple. Gross normal ROM               Cardiovascular: Normal rate and regular rhythm.                 Pulmonary/Chest: Effort normal and breath sounds without rales or wheezing.                Abd:  Soft, NT, ND, + BS, no organomegaly               Neurological: Pt is alert. At baseline orientation, motor grossly intact               Skin: Skin is warm. No rashes, no other new lesions, LE edema - trace LLE only               Psychiatric: Pt behavior is normal without agitation   Micro: none  Cardiac tracings I have personally interpreted today:  none  Pertinent Radiological findings (summarize): none   Lab Results  Component Value Date   WBC 5.4 03/28/2021   HGB 12.7 03/28/2021   HCT 38.5 03/28/2021   PLT 222.0 03/28/2021   GLUCOSE 106 (H) 10/03/2021   CHOL 117 10/03/2021   TRIG 87.0 10/03/2021   HDL 42.50 10/03/2021   LDLCALC 57 10/03/2021   ALT 13 10/03/2021   AST 13 10/03/2021   NA 138 10/03/2021   K 3.7 10/03/2021   CL 106 10/03/2021   CREATININE 0.81 10/03/2021   BUN 13 10/03/2021   CO2 26 10/03/2021   TSH 1.71 03/28/2021   INR 1.0 09/23/2020   HGBA1C 5.9 10/03/2021   MICROALBUR 1.1 04/03/2021   Assessment/Plan:  Traci [name] is a 60 y.o. White or Caucasian [1] female with  has a past medical history of ALLERGIC RHINITIS (07/07/2008), Allergy, Anxiety, CONTACT DERMATITIS (11/30/2009), Depression, DIABETES MELLITUS, TYPE II (11/29/2006), Dizziness and giddiness (04/26/2007), HYPERLIPIDEMIA (01/07/2007), HYPERTENSION (07/08/2007), PLANTAR FASCIITIS, LEFT (08/05/2007), Preventative health care (07/30/2010), and Sleep apnea.  Vitamin D deficiency Last vitamin D Lab Results  Component Value Date  VD25OH 34.00 10/03/2021   Low, reminded to increase oral replacement to 2000 u qd   Diabetes Improved with a1c on low side - 5.9 - ok for d/c actos 45 mg ,  And increase the ozempic to 1 mg weekly, cont diet and wt loss,   to f/u any worsening symptoms or concerns, consider d/c metformin eventually  Essential hypertension BP Readings from Last 3 Encounters:  10/11/21 122/68  06/26/21 118/60  04/07/21 116/70   Stable, pt to continue medical treatment lisinopril 5 mg qd  Hyperlipidemia Lab Results  Component Value Date   LDLCALC 57 10/03/2021   Stable, pt to continue current statin pravachol 40 mg qd  Followup: Return in about 6 months (around 04/12/2022).  Cathlean Cower, MD 10/11/2021 4:26 PM [facility]

## 2021-10-11 NOTE — Assessment & Plan Note (Addendum)
Last vitamin D Lab Results  Component Value Date   VD25OH 34.00 10/03/2021   Low, reminded to increase oral replacement to 2000 u qd

## 2021-10-11 NOTE — Assessment & Plan Note (Signed)
Improved with a1c on low side - 5.9 - ok for d/c actos 45 mg ,  And increase the ozempic to 1 mg weekly, cont diet and wt loss,  to f/u any worsening symptoms or concerns, consider d/c metformin eventually

## 2021-10-11 NOTE — Assessment & Plan Note (Signed)
BP Readings from Last 3 Encounters:  10/11/21 122/68  06/26/21 118/60  04/07/21 116/70   Stable, pt to continue medical treatment lisinopril 5 mg qd

## 2021-10-11 NOTE — Assessment & Plan Note (Signed)
Lab Results  Component Value Date   LDLCALC 57 10/03/2021   Stable, pt to continue current statin pravachol 40 mg qd

## 2021-12-18 ENCOUNTER — Encounter: Payer: Self-pay | Admitting: Internal Medicine

## 2021-12-18 ENCOUNTER — Ambulatory Visit (INDEPENDENT_AMBULATORY_CARE_PROVIDER_SITE_OTHER): Payer: 59 | Admitting: Internal Medicine

## 2021-12-18 VITALS — BP 120/80 | HR 83 | Temp 98.4°F | Ht 62.0 in | Wt 194.0 lb

## 2021-12-18 DIAGNOSIS — E1165 Type 2 diabetes mellitus with hyperglycemia: Secondary | ICD-10-CM | POA: Diagnosis not present

## 2021-12-18 DIAGNOSIS — J069 Acute upper respiratory infection, unspecified: Secondary | ICD-10-CM

## 2021-12-18 MED ORDER — HYDROCOD POLI-CHLORPHE POLI ER 10-8 MG/5ML PO SUER: 5.0000 mL | Freq: Two times a day (BID) | ORAL | 0 refills | Status: DC | PRN

## 2021-12-18 MED ORDER — PREDNISONE 20 MG PO TABS: 40.0000 mg | ORAL_TABLET | Freq: Every day | ORAL | 0 refills | Status: AC

## 2021-12-18 NOTE — Progress Notes (Signed)
Subjective:    Patient ID: Traci [name], female    DOB: Jul 20, 1960, 61 y.o.   MRN: [id number]      HPI Traci [name] is here for  Chief Complaint  Patient presents with   Cough    Cough and headache     She is here for an acute visit for cold symptoms.   Her symptoms started Wednesday  She is experiencing fatigue, low-grade fever, mild nasal congestion, mild ear pain, postnasal drainage, cough that is primarily dry-occasionally she will bring up some phlegm and headaches.  She denies any shortness of breath, wheezing, chest tightness, sinus pain.  She has tried taking mucinex, delsym, advil      Medications and allergies reviewed with patient and updated if appropriate.  Current Outpatient Medications on File Prior to Visit  Medication Sig Dispense Refill   ALPRAZolam (XANAX) 0.5 MG tablet 1-2 tab by mouth as needed with travel 8 tablet 0   aspirin 81 MG tablet Take 81 mg by mouth daily.     citalopram (CELEXA) 20 MG tablet TAKE 1 TABLET BY MOUTH ONCE DAILY . 90 tablet 3   glucose blood test strip Use as directed once daily to check blood sugar.  Diagnosis code 250.00 100 each 11   Lancets MISC Use as directed once daily to check blood sugar.  Diagnosis code 250.00 100 each 11   lisinopril (ZESTRIL) 5 MG tablet TAKE 1 TABLET BY MOUTH ONCE DAILY . APPOINTMENT REQUIRED FOR FUTURE REFILLS 90 tablet 3   metFORMIN (GLUCOPHAGE) 1000 MG tablet TAKE 1 TABLET BY MOUTH TWICE DAILY WITH A MEAL 180 tablet 3   pravastatin (PRAVACHOL) 40 MG tablet TAKE 1 TABLET BY MOUTH ONCE DAILY . APPOINTMENT REQUIRED FOR FUTURE REFILLS ANNUAL  APPT  DUE  IN  SEPT 90 tablet 3   scopolamine (TRANSDERM-SCOP, 1.5 MG,) 1 MG/3DAYS Place 1 patch (1.5 mg total) onto the skin every 3 (three) days. 10 patch 12   Semaglutide, 1 MG/DOSE, 4 MG/3ML SOPN Inject 1 mg as directed once a week. 9 mL 3   No current facility-administered medications on file prior to visit.    Review of Systems  Constitutional:   Positive for fatigue and fever.  HENT:  Positive for congestion (mild), ear pain (left ear at one time) and postnasal drip. Negative for sinus pressure, sinus pain and sore throat.        Swollen gland on left side of neck  Respiratory:  Positive for cough (mostly dry). Negative for chest tightness, shortness of breath and wheezing.   Cardiovascular:  Negative for chest pain.  Gastrointestinal:  Positive for vomiting (x 1). Negative for diarrhea and nausea.  Musculoskeletal:  Positive for myalgias.  Neurological:  Positive for headaches. Negative for dizziness and light-headedness.       Objective:   Vitals:   12/18/21 1603  BP: 120/80  Pulse: 83  Temp: 98.4 F (36.9 C)  SpO2: 98%   BP Readings from Last 3 Encounters:  12/18/21 120/80  10/11/21 122/68  06/26/21 118/60   Wt Readings from Last 3 Encounters:  12/18/21 194 lb (88 kg)  10/11/21 207 lb (93.9 kg)  06/26/21 210 lb 9.6 oz (95.5 kg)   Body mass index is 35.48 kg/m.    Physical Exam Constitutional:      General: She is not in acute distress.    Appearance: Normal appearance. She is not ill-appearing.  HENT:     Head: Normocephalic and atraumatic.  Right Ear: Tympanic membrane, ear canal and external ear normal.     Left Ear: Tympanic membrane, ear canal and external ear normal.     Mouth/Throat:     Mouth: Mucous membranes are moist.     Pharynx: No oropharyngeal exudate or posterior oropharyngeal erythema.  Eyes:     Conjunctiva/sclera: Conjunctivae normal.  Cardiovascular:     Rate and Rhythm: Normal rate and regular rhythm.  Pulmonary:     Effort: Pulmonary effort is normal. No respiratory distress.     Breath sounds: Normal breath sounds. No wheezing or rales.  Musculoskeletal:     Cervical back: Neck supple. No tenderness.  Lymphadenopathy:     Cervical: No cervical adenopathy.  Skin:    General: Skin is warm and dry.  Neurological:     Mental Status: She is alert.             Assessment & Plan:    URI, viral: Acute Symptoms likely viral in nature COVID-negative, RSV negative Tussionex cough syrup 5 mL every 12 hours as needed-advised this will cause some drowsiness Prednisone 40 mg daily x 5 days-she is aware this may elevate her sugars temporarily Note given for work Continue symptomatic treatment with over-the-counter cold medications, Tylenol/ibuprofen Increase rest and fluids Call if symptoms worsen or do not improve   Diabetes: Chronic Lab Results  Component Value Date   HGBA1C 5.9 10/03/2021   Sugars are controlled so I think the benefits of the steroids outweigh the elevation in sugar short-term and she agrees Continue current diabetic medications-metformin 1000 mg twice daily, Ozempic 1 mg weekly

## 2021-12-18 NOTE — Patient Instructions (Addendum)
         Medications changes include :   prednisone and cough syrup      Return if symptoms worsen or fail to improve.

## 2021-12-29 ENCOUNTER — Other Ambulatory Visit: Payer: Self-pay | Admitting: Internal Medicine

## 2021-12-29 MED ORDER — HYDROCOD POLI-CHLORPHE POLI ER 10-8 MG/5ML PO SUER: 5.0000 mL | Freq: Two times a day (BID) | ORAL | 0 refills | Status: DC | PRN

## 2022-02-09 ENCOUNTER — Ambulatory Visit: Payer: 59 | Admitting: Internal Medicine

## 2022-02-09 VITALS — BP 120/68 | HR 80 | Temp 98.4°F | Ht 62.0 in | Wt 192.0 lb

## 2022-02-09 DIAGNOSIS — M5030 Other cervical disc degeneration, unspecified cervical region: Secondary | ICD-10-CM

## 2022-02-09 DIAGNOSIS — M5412 Radiculopathy, cervical region: Secondary | ICD-10-CM

## 2022-02-09 DIAGNOSIS — R21 Rash and other nonspecific skin eruption: Secondary | ICD-10-CM | POA: Diagnosis not present

## 2022-02-09 DIAGNOSIS — M722 Plantar fascial fibromatosis: Secondary | ICD-10-CM | POA: Diagnosis not present

## 2022-02-09 DIAGNOSIS — M25561 Pain in right knee: Secondary | ICD-10-CM

## 2022-02-09 MED ORDER — CELECOXIB 200 MG PO CAPS: 200.0000 mg | ORAL_CAPSULE | Freq: Two times a day (BID) | ORAL | 1 refills | Status: AC | PRN

## 2022-02-09 MED ORDER — DOXYCYCLINE HYCLATE 100 MG PO TABS: 100.0000 mg | ORAL_TABLET | Freq: Two times a day (BID) | ORAL | 0 refills | Status: DC

## 2022-02-09 NOTE — Patient Instructions (Signed)
Please take all new medication as prescribed - the antibiotic, as well as the anti-inflammatory as needed for pain  Please continue all other medications as before, and refills have been done if requested.  Please have the pharmacy call with any other refills you may need.  Please continue your efforts at being more active, low cholesterol diet, and weight control.  You are otherwise up to date with prevention measures today.  Please keep your appointments with your specialists as you may have planned  You will be contacted regarding the referral for: podiatry  We will need to consider dermatology for your hands if not improved, as well as orthopedic if the MRI is abnormal

## 2022-02-09 NOTE — Progress Notes (Unsigned)
Patient ID: Traci [name], female   DOB: 1960-03-14, 62 y.o.   MRN: [id number]        Chief Complaint: follow up left cercial radiculopathy, rash to palms, left plantar foot pain, right medical knee pain       HPI:  Traci [name] is a 62 y.o. female here with multiple complaints of pain; worst is >6 wks onset left post neck pain with radiation to left upper back, arm to hand assoc with tingling and mlld weakness intermittent.  No dropped things from the hand.  Also with unusual multiple painful knots skin lesions to both hands, post and palmar for 1 wk without fever, chills or other rash.  Also with 1 mo onset left plantar mid foot pain and soreness worse to walk, better to sit.  Also with mild right medial knee pain and soreness without swelling, giveaways or falls.         Wt Readings from Last 3 Encounters:  02/09/22 192 lb (87.1 kg)  12/18/21 194 lb (88 kg)  10/11/21 207 lb (93.9 kg)   BP Readings from Last 3 Encounters:  02/09/22 120/68  12/18/21 120/80  10/11/21 122/68         Past Medical History:  Diagnosis Date   ALLERGIC RHINITIS 07/07/2008   Allergy    Anxiety    CONTACT DERMATITIS 11/30/2009   resolved   Depression    DIABETES MELLITUS, TYPE II 11/29/2006   Dizziness and giddiness 04/26/2007   HYPERLIPIDEMIA 01/07/2007   HYPERTENSION 07/08/2007   PLANTAR FASCIITIS, LEFT 08/05/2007   resolved   Preventative health care 07/30/2010   Sleep apnea    CPAP nightly   Past Surgical History:  Procedure Laterality Date   COLONOSCOPY  2016   kalplan - polyps   COLONOSCOPY  01/2019   MOUTH SURGERY     teeth extraction   TUBAL LIGATION     WISDOM TOOTH EXTRACTION      reports that she has never smoked. She has never used smokeless tobacco. She reports that she does not currently use alcohol. She reports that she does not use drugs. family history includes Bipolar disorder in her maternal aunt; Cancer in her father, maternal aunt, and mother; Depression in her mother;  Diabetes in her father, maternal aunt, maternal uncle, paternal aunt, and paternal uncle; Fibroids in her maternal aunt and paternal aunt; Glaucoma in her mother; Heart disease in her father and mother; Hyperlipidemia in her father, mother, and paternal aunt; Hypertension in her father, maternal uncle, mother, and paternal aunt; Raynaud syndrome in her mother; Stroke in her father, mother, and paternal uncle; Thrombophilia in her father and paternal uncle; Thyroid disease in her paternal aunt; Varicose Veins in her mother. Allergies  Allergen Reactions   Penicillins     REACTION: Rash   Current Outpatient Medications on File Prior to Visit  Medication Sig Dispense Refill   ALPRAZolam (XANAX) 0.5 MG tablet 1-2 tab by mouth as needed with travel 8 tablet 0   aspirin 81 MG tablet Take 81 mg by mouth daily.     citalopram (CELEXA) 20 MG tablet TAKE 1 TABLET BY MOUTH ONCE DAILY . 90 tablet 3   glucose blood test strip Use as directed once daily to check blood sugar.  Diagnosis code 250.00 100 each 11   Lancets MISC Use as directed once daily to check blood sugar.  Diagnosis code 250.00 100 each 11   lisinopril (ZESTRIL) 5 MG tablet TAKE 1 TABLET BY MOUTH  ONCE DAILY . APPOINTMENT REQUIRED FOR FUTURE REFILLS 90 tablet 3   metFORMIN (GLUCOPHAGE) 1000 MG tablet TAKE 1 TABLET BY MOUTH TWICE DAILY WITH A MEAL 180 tablet 3   pravastatin (PRAVACHOL) 40 MG tablet TAKE 1 TABLET BY MOUTH ONCE DAILY . APPOINTMENT REQUIRED FOR FUTURE REFILLS ANNUAL  APPT  DUE  IN  SEPT 90 tablet 3   scopolamine (TRANSDERM-SCOP, 1.5 MG,) 1 MG/3DAYS Place 1 patch (1.5 mg total) onto the skin every 3 (three) days. 10 patch 12   Semaglutide, 1 MG/DOSE, 4 MG/3ML SOPN Inject 1 mg as directed once a week. 9 mL 3   No current facility-administered medications on file prior to visit.        ROS:  All others reviewed and negative.  Objective        PE:  BP 120/68 (BP Location: Right Arm, Patient Position: Sitting, Cuff Size: Large)    Pulse 80   Temp 98.4 F (36.9 C) (Oral)   Ht '5\' 2"'$  (1.575 m)   Wt 192 lb (87.1 kg)   LMP  (LMP Unknown)   SpO2 94%   BMI 35.12 kg/m                 Constitutional: Pt appears in NAD               HENT: Head: NCAT.                Right Ear: External ear normal.                 Left Ear: External ear normal.                Eyes: . Pupils are equal, round, and reactive to light. Conjunctivae and EOM are normal               Nose: without d/c or deformity               Neck: Neck supple.               Cardiovascular: Normal rate and regular rhythm.                 Pulmonary/Chest: Effort normal and breath sounds without rales or wheezing.                Abd:  Soft, NT, ND, + BS, no organomegaly               Neurological: Pt is alert. At baseline orientation, motor with 4/5 LUE motor only               Skin: Skin is warm, LE edema - none, numerous 10 mm tender skin lesions to both hands               Tender left medial plantar foot                Right knee with mild tender over joint line without swelling                               Psychiatric: Pt behavior is normal without agitation   Micro: none  Cardiac tracings I have personally interpreted today:  none  Pertinent Radiological findings (summarize): none   Lab Results  Component Value Date   WBC 5.4 03/28/2021   HGB 12.7 03/28/2021   HCT 38.5 03/28/2021   PLT 222.0 03/28/2021  GLUCOSE 106 (H) 10/03/2021   CHOL 117 10/03/2021   TRIG 87.0 10/03/2021   HDL 42.50 10/03/2021   LDLCALC 57 10/03/2021   ALT 13 10/03/2021   AST 13 10/03/2021   NA 138 10/03/2021   K 3.7 10/03/2021   CL 106 10/03/2021   CREATININE 0.81 10/03/2021   BUN 13 10/03/2021   CO2 26 10/03/2021   TSH 1.71 03/28/2021   INR 1.0 09/23/2020   HGBA1C 5.9 10/03/2021   MICROALBUR 1.1 04/03/2021   Assessment/Plan:  Traci [name] is a 62 y.o. White or Caucasian [1] female with  has a past medical history of ALLERGIC RHINITIS (07/07/2008), Allergy,  Anxiety, CONTACT DERMATITIS (11/30/2009), Depression, DIABETES MELLITUS, TYPE II (11/29/2006), Dizziness and giddiness (04/26/2007), HYPERLIPIDEMIA (01/07/2007), HYPERTENSION (07/08/2007), PLANTAR FASCIITIS, LEFT (08/05/2007), Preventative health care (07/30/2010), and Sleep apnea.  Left cervical radiculopathy Recent onset with neuro change - for MRI c spine, pt states will call for f/u ortho appt at [place] where she has gone before  Plantar fasciitis Mild to mod, for celebrex 200 bid prn, refer podiatry,  to f/u any worsening symptoms or concerns  Rash Unusual tender hand lesions, for doxy 100 bid course, consider derm or prednisone trial if not improved  Right knee pain Mild medial tender without effusion, suspect has mild underlying djd - for celebrex 200 bid prn as above  Followup: Return if symptoms worsen or fail to improve.  Cathlean Cower, MD 02/10/2022 5:16 PM [facility]

## 2022-02-10 ENCOUNTER — Encounter: Payer: Self-pay | Admitting: Internal Medicine

## 2022-02-10 DIAGNOSIS — M5412 Radiculopathy, cervical region: Secondary | ICD-10-CM | POA: Insufficient documentation

## 2022-02-10 DIAGNOSIS — R21 Rash and other nonspecific skin eruption: Secondary | ICD-10-CM | POA: Insufficient documentation

## 2022-02-10 DIAGNOSIS — M25561 Pain in right knee: Secondary | ICD-10-CM | POA: Insufficient documentation

## 2022-02-10 DIAGNOSIS — M722 Plantar fascial fibromatosis: Secondary | ICD-10-CM | POA: Insufficient documentation

## 2022-02-10 NOTE — Assessment & Plan Note (Signed)
Recent onset with neuro change - for MRI c spine, pt states will call for f/u ortho appt at [place] where she has gone before

## 2022-02-10 NOTE — Assessment & Plan Note (Signed)
Mild to mod, for celebrex 200 bid prn, refer podiatry,  to f/u any worsening symptoms or concerns

## 2022-02-10 NOTE — Assessment & Plan Note (Signed)
Mild medial tender without effusion, suspect has mild underlying djd - for celebrex 200 bid prn as above

## 2022-02-10 NOTE — Assessment & Plan Note (Signed)
Unusual tender hand lesions, for doxy 100 bid course, consider derm or prednisone trial if not improved

## 2022-02-20 ENCOUNTER — Ambulatory Visit (INDEPENDENT_AMBULATORY_CARE_PROVIDER_SITE_OTHER): Payer: 59 | Admitting: Podiatry

## 2022-02-20 ENCOUNTER — Ambulatory Visit (INDEPENDENT_AMBULATORY_CARE_PROVIDER_SITE_OTHER): Payer: 59

## 2022-02-20 ENCOUNTER — Encounter: Payer: Self-pay | Admitting: Podiatry

## 2022-02-20 DIAGNOSIS — M722 Plantar fascial fibromatosis: Secondary | ICD-10-CM

## 2022-02-20 MED ORDER — TRIAMCINOLONE ACETONIDE 40 MG/ML IJ SUSP
20.0000 mg | Freq: Once | INTRAMUSCULAR | Status: AC
  Administered 2022-02-20: 20 mg

## 2022-02-20 NOTE — Patient Instructions (Signed)

## 2022-02-20 NOTE — Progress Notes (Signed)
Subjective:  Patient ID: Traci [name], female    DOB: March 31, 1960,  MRN: [id number] HPI Chief Complaint  Patient presents with   Foot Pain    Arch and lateral foot left - aching x 1 week, no injury, AM pain, hurt more with a lot of walking, tried Advil, then PCP Rx'd NSAID   New Patient (Initial Visit)   Diabetes    Last A1c was 5.9    62 y.o. female presents with the above complaint.   ROS: Denies fever chills nausea vomit muscle aches pains calf pain back pain chest pain shortness of breath.  Past Medical History:  Diagnosis Date   ALLERGIC RHINITIS 07/07/2008   Allergy    Anxiety    CONTACT DERMATITIS 11/30/2009   resolved   Depression    DIABETES MELLITUS, TYPE II 11/29/2006   Dizziness and giddiness 04/26/2007   HYPERLIPIDEMIA 01/07/2007   HYPERTENSION 07/08/2007   PLANTAR FASCIITIS, LEFT 08/05/2007   resolved   Preventative health care 07/30/2010   Sleep apnea    CPAP nightly   Past Surgical History:  Procedure Laterality Date   COLONOSCOPY  2016   kalplan - polyps   COLONOSCOPY  01/2019   MOUTH SURGERY     teeth extraction   TUBAL LIGATION     WISDOM TOOTH EXTRACTION      Current Outpatient Medications:    ALPRAZolam (XANAX) 0.5 MG tablet, 1-2 tab by mouth as needed with travel, Disp: 8 tablet, Rfl: 0   aspirin 81 MG tablet, Take 81 mg by mouth daily., Disp: , Rfl:    celecoxib (CELEBREX) 200 MG capsule, Take 1 capsule (200 mg total) by mouth 2 (two) times daily as needed., Disp: 180 capsule, Rfl: 1   citalopram (CELEXA) 20 MG tablet, TAKE 1 TABLET BY MOUTH ONCE DAILY ., Disp: 90 tablet, Rfl: 3   glucose blood test strip, Use as directed once daily to check blood sugar.  Diagnosis code 250.00, Disp: 100 each, Rfl: 11   Lancets MISC, Use as directed once daily to check blood sugar.  Diagnosis code 250.00, Disp: 100 each, Rfl: 11   lisinopril (ZESTRIL) 5 MG tablet, TAKE 1 TABLET BY MOUTH ONCE DAILY . APPOINTMENT REQUIRED FOR FUTURE REFILLS, Disp: 90 tablet, Rfl:  3   metFORMIN (GLUCOPHAGE) 1000 MG tablet, TAKE 1 TABLET BY MOUTH TWICE DAILY WITH A MEAL, Disp: 180 tablet, Rfl: 3   pravastatin (PRAVACHOL) 40 MG tablet, TAKE 1 TABLET BY MOUTH ONCE DAILY . APPOINTMENT REQUIRED FOR FUTURE REFILLS ANNUAL  APPT  DUE  IN  SEPT, Disp: 90 tablet, Rfl: 3   scopolamine (TRANSDERM-SCOP, 1.5 MG,) 1 MG/3DAYS, Place 1 patch (1.5 mg total) onto the skin every 3 (three) days., Disp: 10 patch, Rfl: 12   Semaglutide, 1 MG/DOSE, 4 MG/3ML SOPN, Inject 1 mg as directed once a week., Disp: 9 mL, Rfl: 3  Allergies  Allergen Reactions   Penicillins     REACTION: Rash   Review of Systems Objective:  There were no vitals filed for this visit.  General: Well developed, nourished, in no acute distress, alert and oriented x3   Dermatological: Skin is warm, dry and supple bilateral. Nails x 10 are well maintained; remaining integument appears unremarkable at this time. There are no open sores, no preulcerative lesions, no rash or signs of infection present.  Vascular: Dorsalis Pedis artery and Posterior Tibial artery pedal pulses are 2/4 bilateral with immedate capillary fill time. Pedal hair growth present. No varicosities and no lower  extremity edema present bilateral.   Neruologic: Grossly intact via light touch bilateral. Vibratory intact via tuning fork bilateral. Protective threshold with Semmes Wienstein monofilament intact to all pedal sites bilateral. Patellar and Achilles deep tendon reflexes 2+ bilateral. No Babinski or clonus noted bilateral.   Musculoskeletal: No gross boney pedal deformities bilateral. No pain, crepitus, or limitation noted with foot and ankle [name] of motion bilateral. Muscular strength 5/5 in all groups tested bilateral.  Pain on palpation medial calcaneal tubercle and along the band of the medial aspect of the plantar fascia.  Gait: Unassisted, Nonantalgic.    Radiographs:  Radiographs taken of the left foot demonstrates an osseously mature  foot with mild demineralization.  She has soft tissue increase in density at the plantar fascia calcaneal insertion site otherwise unremarkable foot x-rays x 3 views  Assessment & Plan:   Assessment: Medial band Planter fasciitis arch and at its attachment point on the left heel.  Plan: Injected the left heel today 20 mg Kenalog 5 mg Marcaine point maximal tenderness.  She will continue her Celebrex we discussed appropriate shoe gear and I will follow-up with her in 1 month     Winnell Bento T. [place], [place]

## 2022-02-25 ENCOUNTER — Other Ambulatory Visit: Payer: 59

## 2022-03-15 ENCOUNTER — Telehealth: Payer: Self-pay | Admitting: Internal Medicine

## 2022-03-15 DIAGNOSIS — E538 Deficiency of other specified B group vitamins: Secondary | ICD-10-CM

## 2022-03-15 DIAGNOSIS — E559 Vitamin D deficiency, unspecified: Secondary | ICD-10-CM

## 2022-03-15 DIAGNOSIS — E611 Iron deficiency: Secondary | ICD-10-CM

## 2022-03-15 DIAGNOSIS — E1165 Type 2 diabetes mellitus with hyperglycemia: Secondary | ICD-10-CM

## 2022-03-15 NOTE — Telephone Encounter (Signed)
Pt has 6 month f/u 4/4 and a lab appt 3/27.  Please enter orders for the lab visit.

## 2022-03-15 NOTE — Telephone Encounter (Signed)
[place] labs done

## 2022-03-20 ENCOUNTER — Ambulatory Visit: Payer: 59 | Admitting: Podiatry

## 2022-04-04 ENCOUNTER — Other Ambulatory Visit (INDEPENDENT_AMBULATORY_CARE_PROVIDER_SITE_OTHER): Payer: 59

## 2022-04-04 DIAGNOSIS — E611 Iron deficiency: Secondary | ICD-10-CM

## 2022-04-04 DIAGNOSIS — E1165 Type 2 diabetes mellitus with hyperglycemia: Secondary | ICD-10-CM

## 2022-04-04 DIAGNOSIS — E538 Deficiency of other specified B group vitamins: Secondary | ICD-10-CM | POA: Diagnosis not present

## 2022-04-04 DIAGNOSIS — E559 Vitamin D deficiency, unspecified: Secondary | ICD-10-CM

## 2022-04-04 LAB — URINALYSIS, ROUTINE W REFLEX MICROSCOPIC
Bilirubin Urine: NEGATIVE
Ketones, ur: NEGATIVE
Leukocytes,Ua: NEGATIVE
Nitrite: NEGATIVE
Specific Gravity, Urine: 1.025 (ref 1.000–1.030)
Total Protein, Urine: NEGATIVE
Urine Glucose: NEGATIVE
Urobilinogen, UA: 0.2 (ref 0.0–1.0)
pH: 5.5 (ref 5.0–8.0)

## 2022-04-04 LAB — CBC WITH DIFFERENTIAL/PLATELET
Basophils Absolute: 0.1 10*3/uL (ref 0.0–0.1)
Basophils Relative: 0.8 % (ref 0.0–3.0)
Eosinophils Absolute: 0.3 10*3/uL (ref 0.0–0.7)
Eosinophils Relative: 3.6 % (ref 0.0–5.0)
HCT: 41.3 % (ref 36.0–46.0)
Hemoglobin: 13.9 g/dL (ref 12.0–15.0)
Lymphocytes Relative: 24.7 % (ref 12.0–46.0)
Lymphs Abs: 1.8 10*3/uL (ref 0.7–4.0)
MCHC: 33.8 g/dL (ref 30.0–36.0)
MCV: 88.9 fl (ref 78.0–100.0)
Monocytes Absolute: 0.4 10*3/uL (ref 0.1–1.0)
Monocytes Relative: 5.6 % (ref 3.0–12.0)
Neutro Abs: 4.6 10*3/uL (ref 1.4–7.7)
Neutrophils Relative %: 65.3 % (ref 43.0–77.0)
Platelets: 271 10*3/uL (ref 150.0–400.0)
RBC: 4.64 Mil/uL (ref 3.87–5.11)
RDW: 13.5 % (ref 11.5–15.5)
WBC: 7.1 10*3/uL (ref 4.0–10.5)

## 2022-04-04 LAB — LIPID PANEL
Cholesterol: 126 mg/dL (ref 0–200)
HDL: 50.2 mg/dL (ref >39.00)
LDL Cholesterol: 57 mg/dL (ref 0–99)
NonHDL: 75.38
Total CHOL/HDL Ratio: 3
Triglycerides: 90 mg/dL (ref 0.0–149.0)
VLDL: 18 mg/dL (ref 0.0–40.0)

## 2022-04-04 LAB — IBC PANEL
Iron: 61 ug/dL (ref 42–145)
Saturation Ratios: 17.1 % — ABNORMAL LOW (ref 20.0–50.0)
TIBC: 357 ug/dL (ref 250.0–450.0)
Transferrin: 255 mg/dL (ref 212.0–360.0)

## 2022-04-04 LAB — BASIC METABOLIC PANEL
BUN: 11 mg/dL (ref 6–23)
CO2: 27 mEq/L (ref 19–32)
Calcium: 9 mg/dL (ref 8.4–10.5)
Chloride: 104 mEq/L (ref 96–112)
Creatinine, Ser: 0.74 mg/dL (ref 0.40–1.20)
GFR: 87.61 mL/min (ref >60.00)
Glucose, Bld: 152 mg/dL — ABNORMAL HIGH (ref 70–99)
Potassium: 4.1 mEq/L (ref 3.5–5.1)
Sodium: 140 mEq/L (ref 135–145)

## 2022-04-04 LAB — MICROALBUMIN / CREATININE URINE RATIO
Creatinine,U: 136.3 mg/dL
Microalb Creat Ratio: 0.5 mg/g (ref 0.0–30.0)
Microalb, Ur: <0.7 mg/dL (ref 0.0–1.9)

## 2022-04-04 LAB — HEMOGLOBIN A1C: Hgb A1c MFr Bld: 6.6 % — ABNORMAL HIGH (ref 4.6–6.5)

## 2022-04-04 LAB — HEPATIC FUNCTION PANEL
ALT: 28 U/L (ref 0–35)
AST: 21 U/L (ref 0–37)
Albumin: 4.2 g/dL (ref 3.5–5.2)
Alkaline Phosphatase: 63 U/L (ref 39–117)
Bilirubin, Direct: 0.1 mg/dL (ref 0.0–0.3)
Total Bilirubin: 0.4 mg/dL (ref 0.2–1.2)
Total Protein: 6.8 g/dL (ref 6.0–8.3)

## 2022-04-04 LAB — TSH: TSH: 1.97 u[IU]/mL (ref 0.35–5.50)

## 2022-04-04 LAB — VITAMIN D 25 HYDROXY (VIT D DEFICIENCY, FRACTURES): VITD: 33.93 ng/mL (ref 30.00–100.00)

## 2022-04-04 LAB — VITAMIN B12: Vitamin B-12: 1392 pg/mL — ABNORMAL HIGH (ref 211–911)

## 2022-04-04 LAB — FERRITIN: Ferritin: 61.6 ng/mL (ref 10.0–291.0)

## 2022-04-05 ENCOUNTER — Other Ambulatory Visit: Payer: No Typology Code available for payment source

## 2022-04-12 ENCOUNTER — Ambulatory Visit: Payer: 59 | Admitting: Internal Medicine

## 2022-04-12 VITALS — BP 122/68 | HR 78 | Temp 98.2°F | Ht 62.0 in | Wt 189.0 lb

## 2022-04-12 DIAGNOSIS — E559 Vitamin D deficiency, unspecified: Secondary | ICD-10-CM

## 2022-04-12 DIAGNOSIS — I10 Essential (primary) hypertension: Secondary | ICD-10-CM | POA: Diagnosis not present

## 2022-04-12 DIAGNOSIS — M5412 Radiculopathy, cervical region: Secondary | ICD-10-CM | POA: Diagnosis not present

## 2022-04-12 DIAGNOSIS — E1165 Type 2 diabetes mellitus with hyperglycemia: Secondary | ICD-10-CM

## 2022-04-12 DIAGNOSIS — E7800 Pure hypercholesterolemia, unspecified: Secondary | ICD-10-CM

## 2022-04-12 DIAGNOSIS — L989 Disorder of the skin and subcutaneous tissue, unspecified: Secondary | ICD-10-CM

## 2022-04-12 DIAGNOSIS — Z0001 Encounter for general adult medical examination with abnormal findings: Secondary | ICD-10-CM | POA: Diagnosis not present

## 2022-04-12 DIAGNOSIS — E538 Deficiency of other specified B group vitamins: Secondary | ICD-10-CM

## 2022-04-12 MED ORDER — SEMAGLUTIDE (2 MG/DOSE) 8 MG/3ML ~~LOC~~ SOPN: 2.0000 mg | PEN_INJECTOR | SUBCUTANEOUS | 3 refills | Status: DC

## 2022-04-12 NOTE — Patient Instructions (Signed)
Ok to increase the ozempic to 2 mg weekly  Please continue all other medications as before, and refills have been done if requested.  Please have the pharmacy call with any other refills you may need.  Please continue your efforts at being more active, low cholesterol diet, and weight control.  You are otherwise up to date with prevention measures today.  Please keep your appointments with your specialists as you may have planned  You will be contacted regarding the referral for: Dermatology, and Sports Medicine  Please make an Appointment to return in 6 months, or sooner if needed, also with Lab Appointment for testing done 3-5 days before at the [place] (so this is for TWO appointments - please see the scheduling desk as you leave)

## 2022-04-12 NOTE — Progress Notes (Signed)
Patient ID: [name], female   DOB: 1960-07-24, 62 y.o.   MRN: [id number]         Chief Complaint:: wellness exam and Medical Management of Chronic Issues (Bumps on hands, neck and shoulder pain left side)  , dm, hld, htn, low vit B12 and D       HPI:  Traci [name] is a 62 y.o. female here for wellness exam; declines colonoscopy, pt will call for eye exam soon, for shingrix at pharmacy, o/w up to date                        Also Pt denies chest pain, increased sob or doe, wheezing, orthopnea, PND, increased LE swelling, palpitations, dizziness or syncope.   Pt denies polydipsia, polyuria, or new focal neuro s/s.   Pt denies fever, wt loss, night sweats, loss of appetite, or other constitutional symptoms  Has persistent subq cystic skin lesions to multiple tips of fingers,  Also Pt continues to have left neck pain without bowel or bladder change, fever, wt loss, but with mild worsening LUE pain/numbness/weakness.  Recent MRI denied per insurance.  Tolerating ozempic 1 mg well.  Has lost several lbs recently.     Wt Readings from Last 3 Encounters:  04/12/22 189 lb (85.7 kg)  02/09/22 192 lb (87.1 kg)  12/18/21 194 lb (88 kg)   BP Readings from Last 3 Encounters:  04/12/22 122/68  02/09/22 120/68  12/18/21 120/80   Immunization History  Administered Date(s) Administered   Hepatitis B 03/06/2017   Influenza Whole 01/07/2007, 01/11/2009, 02/07/2010   Influenza,inj,Quad PF,6+ Mos 12/19/2012, 03/07/2017, 09/10/2017, 09/18/2018, 09/28/2020, 10/11/2021   PFIZER(Purple Top)SARS-COV-2 Vaccination 03/15/2019, 04/15/2019   Pneumococcal Conjugate-13 08/16/2015   Pneumococcal Polysaccharide-23 01/06/2008, 09/28/2020   Td 09/26/2007   Tdap 09/18/2018   Health Maintenance Due  Topic Date Due   COLONOSCOPY (Pts 45-35yrs Insurance coverage will need to be confirmed)  02/05/2020   FOOT EXAM  09/28/2021   OPHTHALMOLOGY EXAM  04/13/2022      Past Medical History:  Diagnosis Date   ALLERGIC  RHINITIS 07/07/2008   Allergy    Anxiety    CONTACT DERMATITIS 11/30/2009   resolved   Depression    DIABETES MELLITUS, TYPE II 11/29/2006   Dizziness and giddiness 04/26/2007   HYPERLIPIDEMIA 01/07/2007   HYPERTENSION 07/08/2007   PLANTAR FASCIITIS, LEFT 08/05/2007   resolved   Preventative health care 07/30/2010   Sleep apnea    CPAP nightly   Past Surgical History:  Procedure Laterality Date   COLONOSCOPY  2016   kalplan - polyps   COLONOSCOPY  01/2019   MOUTH SURGERY     teeth extraction   TUBAL LIGATION     WISDOM TOOTH EXTRACTION      reports that she has never smoked. She has never used smokeless tobacco. She reports that she does not currently use alcohol. She reports that she does not use drugs. family history includes Bipolar disorder in her maternal aunt; Cancer in her father, maternal aunt, and mother; Depression in her mother; Diabetes in her father, maternal aunt, maternal uncle, paternal aunt, and paternal uncle; Fibroids in her maternal aunt and paternal aunt; Glaucoma in her mother; Heart disease in her father and mother; Hyperlipidemia in her father, mother, and paternal aunt; Hypertension in her father, maternal uncle, mother, and paternal aunt; Raynaud syndrome in her mother; Stroke in her father, mother, and paternal uncle; Thrombophilia in her father and paternal  uncle; Thyroid disease in her paternal aunt; Varicose Veins in her mother. Allergies  Allergen Reactions   Penicillins     REACTION: Rash   Current Outpatient Medications on File Prior to Visit  Medication Sig Dispense Refill   ALPRAZolam (XANAX) 0.5 MG tablet 1-2 tab by mouth as needed with travel 8 tablet 0   aspirin 81 MG tablet Take 81 mg by mouth daily.     Aspirin-Calcium Carbonate (BAYER WOMENS) [phone] MG TABS Take by mouth.     celecoxib (CELEBREX) 200 MG capsule Take 1 capsule (200 mg total) by mouth 2 (two) times daily as needed. 180 capsule 1   citalopram (CELEXA) 20 MG tablet TAKE 1 TABLET  BY MOUTH ONCE DAILY . 90 tablet 3   glucose blood test strip Use as directed once daily to check blood sugar.  Diagnosis code 250.00 100 each 11   Lancets MISC Use as directed once daily to check blood sugar.  Diagnosis code 250.00 100 each 11   lisinopril (ZESTRIL) 5 MG tablet TAKE 1 TABLET BY MOUTH ONCE DAILY . APPOINTMENT REQUIRED FOR FUTURE REFILLS 90 tablet 3   metFORMIN (GLUCOPHAGE) 1000 MG tablet TAKE 1 TABLET BY MOUTH TWICE DAILY WITH A MEAL 180 tablet 3   pravastatin (PRAVACHOL) 40 MG tablet TAKE 1 TABLET BY MOUTH ONCE DAILY . APPOINTMENT REQUIRED FOR FUTURE REFILLS ANNUAL  APPT  DUE  IN  SEPT 90 tablet 3   scopolamine (TRANSDERM-SCOP, 1.5 MG,) 1 MG/3DAYS Place 1 patch (1.5 mg total) onto the skin every 3 (three) days. 10 patch 12   No current facility-administered medications on file prior to visit.        ROS:  All others reviewed and negative.  Objective        PE:  BP 122/68   Pulse 78   Temp 98.2 F (36.8 C) (Oral)   Ht 5\' 2"  (1.575 m)   Wt 189 lb (85.7 kg)   LMP  (LMP Unknown)   SpO2 94%   BMI 34.57 kg/m                 Constitutional: Pt appears in NAD               HENT: Head: NCAT.                Right Ear: External ear normal.                 Left Ear: External ear normal.                Eyes: . Pupils are equal, round, and reactive to light. Conjunctivae and EOM are normal               Nose: without d/c or deformity               Neck: Neck supple. Gross normal ROM               Cardiovascular: Normal rate and regular rhythm.                 Pulmonary/Chest: Effort normal and breath sounds without rales or wheezing.                Abd:  Soft, NT, ND, + BS, no organomegaly               Neurological: Pt is alert. At baseline orientation, motor wth 4+/5 LUE weakness  Skin: Skin is warm. No rashes, no other new lesions, LE edema - none               Psychiatric: Pt behavior is normal without agitation   Micro: none  Cardiac tracings I have  personally interpreted today:  none  Pertinent Radiological findings (summarize): none   Lab Results  Component Value Date   WBC 7.1 04/04/2022   HGB 13.9 04/04/2022   HCT 41.3 04/04/2022   PLT 271.0 04/04/2022   GLUCOSE 152 (H) 04/04/2022   CHOL 126 04/04/2022   TRIG 90.0 04/04/2022   HDL 50.20 04/04/2022   LDLCALC 57 04/04/2022   ALT 28 04/04/2022   AST 21 04/04/2022   NA 140 04/04/2022   K 4.1 04/04/2022   CL 104 04/04/2022   CREATININE 0.74 04/04/2022   BUN 11 04/04/2022   CO2 27 04/04/2022   TSH 1.97 04/04/2022   INR 1.0 09/23/2020   HGBA1C 6.6 (H) 04/04/2022   MICROALBUR <0.7 04/04/2022   Assessment/Plan:  MYLISA BRUNSON is a 62 y.o. White or Caucasian [1] female with  has a past medical history of ALLERGIC RHINITIS (07/07/2008), Allergy, Anxiety, CONTACT DERMATITIS (11/30/2009), Depression, DIABETES MELLITUS, TYPE II (11/29/2006), Dizziness and giddiness (04/26/2007), HYPERLIPIDEMIA (01/07/2007), HYPERTENSION (07/08/2007), PLANTAR FASCIITIS, LEFT (08/05/2007), Preventative health care (07/30/2010), and Sleep apnea.  Encounter for well adult exam with abnormal findings Age and sex appropriate education and counseling updated with regular exercise and diet Referrals for preventative services - declines colonoscopy, pt to call for eye exam soon Immunizations addressed - for shingrix at pharmacy Smoking counseling  - none needed Evidence for depression or other mood disorder - none significant Most recent labs reviewed. I have personally reviewed and have noted: 1) the patient's medical and social history 2) The patient's current medications and supplements 3) The patient's height, weight, and BMI have been recorded in the chart   B12 deficiency Lab Results  Component Value Date   VITAMINB12 1,392 (H) 04/04/2022   Stable, cont oral replacement - b12 1000 mcg qd   Diabetes Lab Results  Component Value Date   HGBA1C 6.6 (H) 04/04/2022   Uncontrolled with obeisity  improving, pt to continue current medical treatment metformin 1000 bid, but increased ozempic to 2 mg weekly   Essential hypertension BP Readings from Last 3 Encounters:  04/12/22 122/68  02/09/22 120/68  12/18/21 120/80   Stable, pt to continue medical treatment lisiniopril 5 mg qd   Hyperlipidemia Lab Results  Component Value Date   LDLCALC 57 04/04/2022   Stable, pt to continue current statin pravachol 40 mg qd   Left cervical radiculopathy Mild to mod, for referral Sport Medicine,,  to f/u any worsening symptoms or concerns   Vitamin D deficiency Last vitamin D Lab Results  Component Value Date   VD25OH 33.93 04/04/2022   Low to start oral replacement   Skin lesion With unusual multiple fingertip cystic lesions - refer dermatology  Followup: Return in about 6 months (around 10/12/2022).  Oliver Barre, MD 04/14/2022 7:21 PM [facility]

## 2022-04-14 ENCOUNTER — Encounter: Payer: Self-pay | Admitting: Internal Medicine

## 2022-04-14 NOTE — Assessment & Plan Note (Signed)
Age and sex appropriate education and counseling updated with regular exercise and diet Referrals for preventative services - declines colonoscopy, pt to call for eye exam soon Immunizations addressed - for shingrix at pharmacy Smoking counseling  - none needed Evidence for depression or other mood disorder - none significant Most recent labs reviewed. I have personally reviewed and have noted: 1) the patient's medical and social history 2) The patient's current medications and supplements 3) The patient's height, weight, and BMI have been recorded in the chart

## 2022-04-14 NOTE — Assessment & Plan Note (Signed)
With unusual multiple fingertip cystic lesions - refer dermatology

## 2022-04-14 NOTE — Assessment & Plan Note (Signed)
Lab Results  Component Value Date   LDLCALC 57 04/04/2022   Stable, pt to continue current statin pravachol 40 mg qd

## 2022-04-14 NOTE — Assessment & Plan Note (Signed)
Last vitamin D Lab Results  Component Value Date   VD25OH 33.93 04/04/2022   Low to start oral replacement

## 2022-04-14 NOTE — Assessment & Plan Note (Signed)
Lab Results  Component Value Date   VITAMINB12 1,392 (H) 04/04/2022   Stable, cont oral replacement - b12 1000 mcg qd

## 2022-04-14 NOTE — Assessment & Plan Note (Signed)
Mild to mod, for referral Sport Medicine,,  to f/u any worsening symptoms or concerns

## 2022-04-14 NOTE — Assessment & Plan Note (Signed)
Lab Results  Component Value Date   HGBA1C 6.6 (H) 04/04/2022   Uncontrolled with obeisity improving, pt to continue current medical treatment metformin 1000 bid, but increased ozempic to 2 mg weekly

## 2022-04-14 NOTE — Assessment & Plan Note (Signed)
BP Readings from Last 3 Encounters:  04/12/22 122/68  02/09/22 120/68  12/18/21 120/80   Stable, pt to continue medical treatment lisiniopril 5 mg qd

## 2022-04-16 ENCOUNTER — Other Ambulatory Visit: Payer: Self-pay | Admitting: Internal Medicine

## 2022-04-18 NOTE — Progress Notes (Unsigned)
    Aleen Sells D.[facility] [address] [place] [address] Phone: [phone]   Assessment and Plan:     There are no diagnoses linked to this encounter.  ***   Pertinent previous records reviewed include ***   Follow Up: ***     Subjective:   I, Batool Majid, am serving as a Neurosurgeon for Doctor Richardean Sale  Chief Complaint: neck pain   HPI:   04/19/2022 Patient is a 62 year old female complaining of neck pain. Patient states  Relevant Historical Information: ***  Additional pertinent review of systems negative.   Current Outpatient Medications:    ALPRAZolam (XANAX) 0.5 MG tablet, 1-2 tab by mouth as needed with travel, Disp: 8 tablet, Rfl: 0   aspirin 81 MG tablet, Take 81 mg by mouth daily., Disp: , Rfl:    Aspirin-Calcium Carbonate (BAYER WOMENS) [phone] MG TABS, Take by mouth., Disp: , Rfl:    celecoxib (CELEBREX) 200 MG capsule, Take 1 capsule (200 mg total) by mouth 2 (two) times daily as needed., Disp: 180 capsule, Rfl: 1   citalopram (CELEXA) 20 MG tablet, TAKE 1 TABLET BY MOUTH ONCE DAILY ., Disp: 90 tablet, Rfl: 3   glucose blood test strip, Use as directed once daily to check blood sugar.  Diagnosis code 250.00, Disp: 100 each, Rfl: 11   Lancets MISC, Use as directed once daily to check blood sugar.  Diagnosis code 250.00, Disp: 100 each, Rfl: 11   lisinopril (ZESTRIL) 5 MG tablet, TAKE 1 TABLET BY MOUTH ONCE DAILY . APPOINTMENT REQUIRED FOR FUTURE REFILLS, Disp: 90 tablet, Rfl: 3   metFORMIN (GLUCOPHAGE) 1000 MG tablet, TAKE 1 TABLET BY MOUTH TWICE DAILY WITH A MEAL, Disp: 180 tablet, Rfl: 3   pravastatin (PRAVACHOL) 40 MG tablet, TAKE 1 TABLET BY MOUTH ONCE DAILY . APPOINTMENT REQUIRED FOR FUTURE REFILLS ANNUAL  APPT  DUE  IN  SEPT, Disp: 90 tablet, Rfl: 3   scopolamine (TRANSDERM-SCOP, 1.5 MG,) 1 MG/3DAYS, Place 1 patch (1.5 mg total) onto the skin every 3 (three) days., Disp: 10 patch, Rfl: 12   Semaglutide, 2  MG/DOSE, 8 MG/3ML SOPN, Inject 2 mg as directed once a week., Disp: 9 mL, Rfl: 3   Objective:     There were no vitals filed for this visit.    There is no height or weight on file to calculate BMI.    Physical Exam:    ***   Electronically signed by:  Aleen Sells D.[facility] 7:39 AM 04/18/22

## 2022-04-19 ENCOUNTER — Ambulatory Visit (INDEPENDENT_AMBULATORY_CARE_PROVIDER_SITE_OTHER): Payer: 59

## 2022-04-19 ENCOUNTER — Ambulatory Visit (INDEPENDENT_AMBULATORY_CARE_PROVIDER_SITE_OTHER): Payer: 59 | Admitting: Sports Medicine

## 2022-04-19 ENCOUNTER — Encounter: Payer: Self-pay | Admitting: Sports Medicine

## 2022-04-19 VITALS — BP 128/78 | HR 84 | Ht 62.0 in | Wt 191.4 lb

## 2022-04-19 DIAGNOSIS — M5030 Other cervical disc degeneration, unspecified cervical region: Secondary | ICD-10-CM

## 2022-04-19 DIAGNOSIS — M542 Cervicalgia: Secondary | ICD-10-CM

## 2022-04-19 DIAGNOSIS — F40240 Claustrophobia: Secondary | ICD-10-CM | POA: Diagnosis not present

## 2022-04-19 MED ORDER — LORAZEPAM 0.5 MG PO TABS
ORAL_TABLET | ORAL | 0 refills | Status: DC
Start: 2022-04-19 — End: 2022-06-28

## 2022-04-19 NOTE — Patient Instructions (Addendum)
Referral to PT for neck pain  MRI C-Spine w/o [facility]  Call to schedule a follow-up appointment for 3 days after your MRI. We are sending in Ativan to make fore claustrophobia before your MRI.

## 2022-04-22 ENCOUNTER — Ambulatory Visit (INDEPENDENT_AMBULATORY_CARE_PROVIDER_SITE_OTHER): Payer: 59

## 2022-04-22 DIAGNOSIS — M542 Cervicalgia: Secondary | ICD-10-CM | POA: Diagnosis not present

## 2022-04-24 NOTE — Progress Notes (Unsigned)
    Traci [name] D.[name] [address] [place] [address] Phone: [phone]   Assessment and Plan:     There are no diagnoses linked to this encounter.  ***   Pertinent previous records reviewed include ***   Follow Up: ***     Subjective:   I, Traci [name], am serving as a Neurosurgeon for Doctor Richardean Sale  Chief Complaint: neck pain   HPI:  04/19/2022 Patient is a 62 year old female complaining of neck pain. Patient states initially sx were left-sided but now noticing on the right side as well. Chronic neck pain, worsening over the past 2 months. Pulling sensation when looking side-to-side, no pain looking up and down. More recently, has noticed HA's related to neck pain. Intermittent radicular sx not extending beyond the elbow. Occasional mechanical sx. Was having n/t in the hands but that has resolved. Denies weakness/decreased grip strength. Rarely pain will radiate into the upper back/periscapular region. Denies night disturbance. Has tried Naproxen, IBU, Tylenol, heat, Bio-Freeze, Bengay, and Thera-Gun. No longer-term relief with these treatment modalities.   04/25/2022 Patient states   Relevant Historical Information: Hypertension, vertigo  Additional pertinent review of systems negative.   Current Outpatient Medications:    ALPRAZolam (XANAX) 0.5 MG tablet, 1-2 tab by mouth as needed with travel, Disp: 8 tablet, Rfl: 0   aspirin 81 MG tablet, Take 81 mg by mouth daily., Disp: , Rfl:    celecoxib (CELEBREX) 200 MG capsule, Take 1 capsule (200 mg total) by mouth 2 (two) times daily as needed., Disp: 180 capsule, Rfl: 1   citalopram (CELEXA) 20 MG tablet, TAKE 1 TABLET BY MOUTH ONCE DAILY ., Disp: 90 tablet, Rfl: 3   glucose blood test strip, Use as directed once daily to check blood sugar.  Diagnosis code 250.00, Disp: 100 each, Rfl: 11   Lancets MISC, Use as directed once daily to check blood sugar.  Diagnosis code 250.00,  Disp: 100 each, Rfl: 11   lisinopril (ZESTRIL) 5 MG tablet, TAKE 1 TABLET BY MOUTH ONCE DAILY . APPOINTMENT REQUIRED FOR FUTURE REFILLS, Disp: 90 tablet, Rfl: 3   LORazepam (ATIVAN) 0.5 MG tablet, 1-2 tabs 30 - 60 min prior to MRI. Do not drive with this medicine., Disp: 4 tablet, Rfl: 0   metFORMIN (GLUCOPHAGE) 1000 MG tablet, TAKE 1 TABLET BY MOUTH TWICE DAILY WITH A MEAL, Disp: 180 tablet, Rfl: 0   pravastatin (PRAVACHOL) 40 MG tablet, TAKE 1 TABLET BY MOUTH ONCE DAILY . APPOINTMENT REQUIRED FOR FUTURE REFILLS ANNUAL  APPT  DUE  IN  SEPT, Disp: 90 tablet, Rfl: 3   scopolamine (TRANSDERM-SCOP, 1.5 MG,) 1 MG/3DAYS, Place 1 patch (1.5 mg total) onto the skin every 3 (three) days., Disp: 10 patch, Rfl: 12   Semaglutide, 2 MG/DOSE, 8 MG/3ML SOPN, Inject 2 mg as directed once a week., Disp: 9 mL, Rfl: 3   Objective:     There were no vitals filed for this visit.    There is no height or weight on file to calculate BMI.    Physical Exam:    ***   Electronically signed by:  Traci [name] D.[name] 7:43 AM 04/24/22

## 2022-04-25 ENCOUNTER — Ambulatory Visit (INDEPENDENT_AMBULATORY_CARE_PROVIDER_SITE_OTHER): Payer: 59 | Admitting: Sports Medicine

## 2022-04-25 VITALS — BP 130/78 | HR 66 | Ht 62.0 in | Wt 191.0 lb

## 2022-04-25 DIAGNOSIS — M5020 Other cervical disc displacement, unspecified cervical region: Secondary | ICD-10-CM | POA: Diagnosis not present

## 2022-04-25 DIAGNOSIS — M5030 Other cervical disc degeneration, unspecified cervical region: Secondary | ICD-10-CM | POA: Diagnosis not present

## 2022-04-25 DIAGNOSIS — M542 Cervicalgia: Secondary | ICD-10-CM

## 2022-04-25 MED ORDER — METHYLPREDNISOLONE 4 MG PO TBPK: ORAL_TABLET | ORAL | 0 refills | Status: DC

## 2022-04-25 NOTE — Patient Instructions (Addendum)
Good to see you  Prednisone dos pak Neurosurgery referral  As needed follow up  

## 2022-05-10 ENCOUNTER — Other Ambulatory Visit: Payer: Self-pay | Admitting: Internal Medicine

## 2022-05-18 ENCOUNTER — Other Ambulatory Visit: Payer: Self-pay

## 2022-06-27 NOTE — Progress Notes (Signed)
HPI Traci [name] never smoker followed for OSA, complicated by HBP, Allergic Rhinitis, DM, Hyperlipidemia HST 09/11/2018- AHI 9.8/ hr, desaturation to 82%, body weight 201 lbs  ---------------------------------------------------------------------------  06/26/21-  61 yoF never smoker followed for OSA, complicated by HTN,  Allergic Rhinitis, DM,2 Hyperlipidemia, Small Lung Nodules,  CPAP auto 5-15/ [facility] Body weight today-210 lbs Covid vax-2 Ph[employer] Download- compliance 90%, AHI 1.2/ hr Download reviewed. Doing well with CPAP.  Discussed lung nodules and will Traci [name].u  CXR.  06/28/22- 61 yoF never smoker followed for OSA, Small Lung Nodules, complicated by HTN,  Allergic Rhinitis, DM2 Hyperlipidemia,  CPAP auto 5-15/ [facility] Body weight today 188 lbs Download compliance- 87%, AHI 0.9/ hr Load reviewed. Recent dry mouth using nasal pillows mask.  We discussed effective air conditioning.  Sore shoulder if she lies on her back.  Comfort issues reviewed. Will try reducing airflow by reducing AutoPap range to 5-10. CXR 06/26/21- IMPRESSION: No active cardiopulmonary disease. The previously noted nodules on cardiac CT are not well appreciated on chest x-ray.   ROS-see HPI   + = positive Constitutional:    weight loss, night sweats, fevers, chills, fatigue, lassitude. HEENT:    headaches, difficulty swallowing, tooth/dental problems, sore throat,       sneezing, itching, ear ache, nasal congestion, post nasal drip, snoring CV:    chest pain, orthopnea, PND, +swelling in lower extremities, anasarca,                                   dizziness, palpitations Resp:   shortness of breath with exertion or at rest.                productive cough,   non-productive cough, coughing up of blood.              change in color of mucus.  wheezing.   Skin:    rash or lesions. GI:  No-   heartburn, indigestion, abdominal pain, nausea, vomiting, diarrhea,                 change in bowel habits, loss of appetite GU: dysuria,  change in color of urine, no urgency or frequency.   flank pain. MS:   joint pain, stiffness, decreased range of motion, back pain. Neuro-     nothing unusual Psych:  change in mood or affect.  depression or anxiety.   memory loss.  OBJ- Physical Exam General- Alert, Oriented, Affect-appropriate, Distress- none acute, + overweight, ?almost Cushingoid-thick neck Skin- rash-none, lesions- none, excoriation- none Lymphadenopathy- none Head- atraumatic            Eyes- Gross vision intact, PERRLA, conjunctivae and secretions clear            Ears- Hearing, canals-normal            Nose- Clear, no-Septal dev, mucus, polyps, erosion, perforation             Throat- Mallampati IV , mucosa clear , drainage- none, tonsils-, few missing teeth Neck- flexible , trachea midline, no stridor , thyroid nl, carotid no bruit Chest - symmetrical excursion , unlabored           Heart/CV- RRR , no murmur , no gallop  , no rub, nl s1 s2                           - JVD-  none , edema- none, stasis changes- none, varices- none           Lung- clear to P&A, wheeze- none, cough- none , dullness-none, rub- none           Chest wall-  Abd-  Br/ Gen/ Rectal- Not done, not indicated Extrem- cyanosis- none, clubbing, none, atrophy- none, strength- nl Neuro- grossly intact to observation

## 2022-06-28 ENCOUNTER — Ambulatory Visit (INDEPENDENT_AMBULATORY_CARE_PROVIDER_SITE_OTHER): Payer: 59 | Admitting: Internal Medicine

## 2022-06-28 ENCOUNTER — Ambulatory Visit: Payer: No Typology Code available for payment source | Admitting: Internal Medicine

## 2022-06-28 ENCOUNTER — Encounter: Payer: Self-pay | Admitting: Internal Medicine

## 2022-06-28 VITALS — BP 118/64 | HR 80 | Ht 62.0 in | Wt 188.8 lb

## 2022-06-28 DIAGNOSIS — R918 Other nonspecific abnormal finding of lung field: Secondary | ICD-10-CM | POA: Diagnosis not present

## 2022-06-28 DIAGNOSIS — G4733 Obstructive sleep apnea (adult) (pediatric): Secondary | ICD-10-CM | POA: Diagnosis not present

## 2022-06-28 NOTE — Patient Instructions (Signed)
Order- DME [facility]- please reduce autopap range to 5-10  For dry mouth- Try turning up the CPAP humidifier a little Try rinsing your mouth with Biotene otc mouthwash at bedtime Try paper tape to keep mouth closed Notice if your bedroom air is a little drier lately Try just a little more water before bed

## 2022-08-06 ENCOUNTER — Encounter: Payer: Self-pay | Admitting: Internal Medicine

## 2022-08-06 NOTE — Assessment & Plan Note (Signed)
No obvious progressive process on chest x-ray. Plan-consider when to update CT

## 2022-08-06 NOTE — Assessment & Plan Note (Signed)
If it is from CPAP. Plan-reduce AutoPap range to 5-10 to reduce nasal drying

## 2022-08-21 LAB — HM MAMMOGRAPHY

## 2022-08-22 ENCOUNTER — Encounter: Payer: Self-pay | Admitting: Internal Medicine

## 2022-08-24 ENCOUNTER — Other Ambulatory Visit: Payer: Self-pay | Admitting: Internal Medicine

## 2022-08-24 ENCOUNTER — Other Ambulatory Visit (HOSPITAL_COMMUNITY): Payer: Self-pay

## 2022-08-24 ENCOUNTER — Telehealth: Payer: Self-pay

## 2022-08-24 ENCOUNTER — Telehealth: Payer: Self-pay | Admitting: Internal Medicine

## 2022-08-24 ENCOUNTER — Other Ambulatory Visit: Payer: Self-pay

## 2022-08-24 MED ORDER — SEMAGLUTIDE (2 MG/DOSE) 8 MG/3ML ~~LOC~~ SOPN: 2.0000 mg | PEN_INJECTOR | SUBCUTANEOUS | 0 refills | Status: DC

## 2022-08-24 NOTE — Progress Notes (Unsigned)
Pt needs PA for Ozempic

## 2022-08-24 NOTE — Telephone Encounter (Signed)
Sent refill to [facility]../lmb 

## 2022-08-24 NOTE — Telephone Encounter (Signed)
Prescription Request  08/24/2022  LOV: 04/12/2022  What is the name of the medication or equipment? Semaglutide, 2 MG/DOSE, 8 MG/3ML SOPN   Have you contacted your pharmacy to request a refill? No   Which pharmacy would you like this sent to?  [facility] [address], [place] - [address] [place] [address] Phone: [phone] Fax: [phone]    Patient notified that their request is being sent to the clinical staff for review and that they should receive a response within 2 business days.   Please advise at Mobile [phone] (mobile)

## 2022-08-24 NOTE — Telephone Encounter (Signed)
Pharmacy Patient Advocate Encounter   Received notification from Pt Calls Messages that prior authorization for Ozempic 8mg /54ml is required/requested.   Insurance verification completed.   The patient is insured through [facility] .   Per test claim: PA required; PA submitted to [facility] via CoverMyMeds Key/confirmation #/EOC [id number] Status is pending

## 2022-08-27 NOTE — Telephone Encounter (Signed)
Pharmacy Patient Advocate Encounter  Received notification from [facility] that Prior Authorization for Ozempic 8mg /46ml has been APPROVED from 08/24/22 to 08/24/23   PA #/Case ID/Reference #:  [id number]

## 2022-08-27 NOTE — Progress Notes (Signed)
My chart message send to pt about approval

## 2022-09-07 ENCOUNTER — Other Ambulatory Visit: Payer: Self-pay | Admitting: Internal Medicine

## 2022-09-07 ENCOUNTER — Other Ambulatory Visit: Payer: Self-pay

## 2022-09-27 ENCOUNTER — Other Ambulatory Visit: Payer: Self-pay | Admitting: Internal Medicine

## 2022-09-27 ENCOUNTER — Other Ambulatory Visit: Payer: Self-pay

## 2022-10-11 ENCOUNTER — Other Ambulatory Visit (INDEPENDENT_AMBULATORY_CARE_PROVIDER_SITE_OTHER): Payer: 59

## 2022-10-11 DIAGNOSIS — E538 Deficiency of other specified B group vitamins: Secondary | ICD-10-CM

## 2022-10-11 DIAGNOSIS — E1165 Type 2 diabetes mellitus with hyperglycemia: Secondary | ICD-10-CM

## 2022-10-11 DIAGNOSIS — E559 Vitamin D deficiency, unspecified: Secondary | ICD-10-CM

## 2022-10-11 LAB — HEPATIC FUNCTION PANEL
ALT: 32 U/L (ref 0–35)
AST: 30 U/L (ref 0–37)
Albumin: 4 g/dL (ref 3.5–5.2)
Alkaline Phosphatase: 57 U/L (ref 39–117)
Bilirubin, Direct: 0.1 mg/dL (ref 0.0–0.3)
Total Bilirubin: 0.4 mg/dL (ref 0.2–1.2)
Total Protein: 6.5 g/dL (ref 6.0–8.3)

## 2022-10-11 LAB — LIPID PANEL
Cholesterol: 128 mg/dL (ref 0–200)
HDL: 40.1 mg/dL (ref >39.00)
LDL Cholesterol: 66 mg/dL (ref 0–99)
NonHDL: 87.79
Total CHOL/HDL Ratio: 3
Triglycerides: 109 mg/dL (ref 0.0–149.0)
VLDL: 21.8 mg/dL (ref 0.0–40.0)

## 2022-10-11 LAB — VITAMIN B12: Vitamin B-12: 1453 pg/mL — ABNORMAL HIGH (ref 211–911)

## 2022-10-11 LAB — BASIC METABOLIC PANEL
BUN: 12 mg/dL (ref 6–23)
CO2: 28 meq/L (ref 19–32)
Calcium: 9.1 mg/dL (ref 8.4–10.5)
Chloride: 104 meq/L (ref 96–112)
Creatinine, Ser: 0.7 mg/dL (ref 0.40–1.20)
GFR: 93.31 mL/min (ref >60.00)
Glucose, Bld: 135 mg/dL — ABNORMAL HIGH (ref 70–99)
Potassium: 4.8 meq/L (ref 3.5–5.1)
Sodium: 139 meq/L (ref 135–145)

## 2022-10-11 LAB — HEMOGLOBIN A1C: Hgb A1c MFr Bld: 6.7 % — ABNORMAL HIGH (ref 4.6–6.5)

## 2022-10-11 LAB — VITAMIN D 25 HYDROXY (VIT D DEFICIENCY, FRACTURES): VITD: 40.94 ng/mL (ref 30.00–100.00)

## 2022-10-18 ENCOUNTER — Encounter: Payer: Self-pay | Admitting: Internal Medicine

## 2022-10-18 ENCOUNTER — Encounter: Payer: 59 | Admitting: Internal Medicine

## 2022-10-18 ENCOUNTER — Ambulatory Visit (INDEPENDENT_AMBULATORY_CARE_PROVIDER_SITE_OTHER): Payer: 59 | Admitting: Internal Medicine

## 2022-10-18 VITALS — BP 120/72 | HR 73 | Temp 98.5°F | Ht 62.0 in | Wt 183.0 lb

## 2022-10-18 DIAGNOSIS — E559 Vitamin D deficiency, unspecified: Secondary | ICD-10-CM

## 2022-10-18 DIAGNOSIS — Z23 Encounter for immunization: Secondary | ICD-10-CM

## 2022-10-18 DIAGNOSIS — I10 Essential (primary) hypertension: Secondary | ICD-10-CM | POA: Diagnosis not present

## 2022-10-18 DIAGNOSIS — H6692 Otitis media, unspecified, left ear: Secondary | ICD-10-CM

## 2022-10-18 DIAGNOSIS — E1165 Type 2 diabetes mellitus with hyperglycemia: Secondary | ICD-10-CM

## 2022-10-18 DIAGNOSIS — E538 Deficiency of other specified B group vitamins: Secondary | ICD-10-CM | POA: Diagnosis not present

## 2022-10-18 DIAGNOSIS — Z7985 Long-term (current) use of injectable non-insulin antidiabetic drugs: Secondary | ICD-10-CM

## 2022-10-18 DIAGNOSIS — Z124 Encounter for screening for malignant neoplasm of cervix: Secondary | ICD-10-CM

## 2022-10-18 DIAGNOSIS — Z7984 Long term (current) use of oral hypoglycemic drugs: Secondary | ICD-10-CM

## 2022-10-18 MED ORDER — CITALOPRAM HYDROBROMIDE 20 MG PO TABS: ORAL_TABLET | ORAL | 3 refills | Status: DC

## 2022-10-18 MED ORDER — LISINOPRIL 5 MG PO TABS: ORAL_TABLET | ORAL | 3 refills | Status: DC

## 2022-10-18 MED ORDER — TIRZEPATIDE 2.5 MG/0.5ML ~~LOC~~ SOAJ: 2.5000 mg | SUBCUTANEOUS | 11 refills | Status: DC

## 2022-10-18 MED ORDER — AZITHROMYCIN 250 MG PO TABS: ORAL_TABLET | ORAL | 1 refills | Status: AC

## 2022-10-18 MED ORDER — PRAVASTATIN SODIUM 40 MG PO TABS: ORAL_TABLET | ORAL | 3 refills | Status: DC

## 2022-10-18 MED ORDER — METFORMIN HCL 1000 MG PO TABS: ORAL_TABLET | ORAL | 3 refills | Status: DC

## 2022-10-18 NOTE — Patient Instructions (Addendum)
Ok to reduce the B12 to Mon - Wed- Fri  Please take all new medication as prescribed - the antibiotic for the left ear  Ok to change the ozempic to mounjaro 2.5 mg weekly, and call in 1 month (and every month after) if you are doing well without too much Gi upset for the next upper dose   Please continue all other medications as before, and refills have been done as requested.  Please have the pharmacy call with any other refills you may need.  Please continue your efforts at being more active, low cholesterol diet, and weight control..  Please keep your appointments with your specialists as you may have planned  Please make an Appointment to return in 6 months, or sooner if needed, also with Lab Appointment for testing done 3-5 days before at the FIRST FLOOR Lab (so this is for TWO appointments - please see the scheduling desk as you leave)

## 2022-10-18 NOTE — Progress Notes (Signed)
Patient ID: Traci [name], female   DOB: 01-21-1960, 62 y.o.   MRN: [id number]        Chief Complaint: follow up b12 deficiency, dm, left otitis media,, htn       HPI:  Traci [name] is a 62 y.o. female here with c/o 3 days onset left ear pain with feverish, muffled hearing.  Pt denies chest pain, increased sob or doe, wheezing, orthopnea, PND, increased LE swelling, palpitations, dizziness or syncope.   Pt denies polydipsia, polyuria, or new focal neuro s/s.   Taking B12 dally/.  Difficult to lose wt even at high dose ozempic.  Plans to call for eye exam.  Wt Readings from Last 3 Encounters:  10/18/22 183 lb (83 kg)  06/28/22 188 lb 12.8 oz (85.6 kg)  04/25/22 191 lb (86.6 kg)   BP Readings from Last 3 Encounters:  10/18/22 120/72  06/28/22 118/64  04/25/22 130/78         Past Medical History:  Diagnosis Date   ALLERGIC RHINITIS 07/07/2008   Allergy    Anxiety    CONTACT DERMATITIS 11/30/2009   resolved   Depression    DIABETES MELLITUS, TYPE II 11/29/2006   Dizziness and giddiness 04/26/2007   HYPERLIPIDEMIA 01/07/2007   HYPERTENSION 07/08/2007   PLANTAR FASCIITIS, LEFT 08/05/2007   resolved   Preventative health care 07/30/2010   Sleep apnea    CPAP nightly   Past Surgical History:  Procedure Laterality Date   COLONOSCOPY  2016   kalplan - polyps   COLONOSCOPY  01/2019   MOUTH SURGERY     teeth extraction   TUBAL LIGATION     WISDOM TOOTH EXTRACTION      reports that she has never smoked. She has never used smokeless tobacco. She reports that she does not currently use alcohol. She reports that she does not use drugs. family history includes Bipolar disorder in her maternal aunt; Cancer in her father, maternal aunt, and mother; Depression in her mother; Diabetes in her father, maternal aunt, maternal uncle, paternal aunt, and paternal uncle; Fibroids in her maternal aunt and paternal aunt; Glaucoma in her mother; Heart disease in her father and mother; Hyperlipidemia in  her father, mother, and paternal aunt; Hypertension in her father, maternal uncle, mother, and paternal aunt; Raynaud syndrome in her mother; Stroke in her father, mother, and paternal uncle; Thrombophilia in her father and paternal uncle; Thyroid disease in her paternal aunt; Varicose Veins in her mother. Allergies  Allergen Reactions   Penicillins     REACTION: Rash   Current Outpatient Medications on File Prior to Visit  Medication Sig Dispense Refill   ALPRAZolam (XANAX) 0.5 MG tablet 1-2 tab by mouth as needed with travel 8 tablet 0   aspirin 81 MG tablet Take 81 mg by mouth daily.     celecoxib (CELEBREX) 200 MG capsule Take 1 capsule (200 mg total) by mouth 2 (two) times daily as needed. 180 capsule 1   Cholecalciferol (D2000 ULTRA STRENGTH) 50 MCG (2000 UT) CAPS      cyanocobalamin (VITAMIN B12) 100 MCG tablet Take 100 mcg by mouth daily.     glucose blood test strip Use as directed once daily to check blood sugar.  Diagnosis code 250.00 100 each 11   Lancets MISC Use as directed once daily to check blood sugar.  Diagnosis code 250.00 100 each 11   scopolamine (TRANSDERM-SCOP, 1.5 MG,) 1 MG/3DAYS Place 1 patch (1.5 mg total) onto the skin every 3 (  three) days. 10 patch 12   No current facility-administered medications on file prior to visit.        ROS:  All others reviewed and negative.  Objective        PE:  BP 120/72 (BP Location: Right Arm, Patient Position: Sitting, Cuff Size: Normal)   Pulse 73   Temp 98.5 F (36.9 C) (Oral)   Ht 5\' 2"  (1.575 m)   Wt 183 lb (83 kg)   LMP  (LMP Unknown)   SpO2 98%   BMI 33.47 kg/m                 Constitutional: Pt appears in NAD               HENT: Head: NCAT.                Right Ear: External ear normal.                 Left Ear: External ear normal. Left tm with severe erythema, bulging               Eyes: . Pupils are equal, round, and reactive to light. Conjunctivae and EOM are normal               Nose: without d/c or  deformity               Neck: Neck supple. Gross normal ROM               Cardiovascular: Normal rate and regular rhythm.                 Pulmonary/Chest: Effort normal and breath sounds without rales or wheezing.                Abd:  Soft, NT, ND, + BS, no organomegaly               Neurological: Pt is alert. At baseline orientation, motor grossly intact               Skin: Skin is warm. No rashes, no other new lesions, LE edema - none               Psychiatric: Pt behavior is normal without agitation   Micro: none  Cardiac tracings I have personally interpreted today:  none  Pertinent Radiological findings (summarize): none   Lab Results  Component Value Date   WBC 7.1 04/04/2022   HGB 13.9 04/04/2022   HCT 41.3 04/04/2022   PLT 271.0 04/04/2022   GLUCOSE 135 (H) 10/11/2022   CHOL 128 10/11/2022   TRIG 109.0 10/11/2022   HDL 40.10 10/11/2022   LDLCALC 66 10/11/2022   ALT 32 10/11/2022   AST 30 10/11/2022   NA 139 10/11/2022   K 4.8 10/11/2022   CL 104 10/11/2022   CREATININE 0.70 10/11/2022   BUN 12 10/11/2022   CO2 28 10/11/2022   TSH 1.97 04/04/2022   INR 1.0 09/23/2020   HGBA1C 6.7 (H) 10/11/2022   MICROALBUR <0.7 04/04/2022   Assessment/Plan:  Traci [name] is a 62 y.o. White or Caucasian [1] female with  has a past medical history of ALLERGIC RHINITIS (07/07/2008), Allergy, Anxiety, CONTACT DERMATITIS (11/30/2009), Depression, DIABETES MELLITUS, TYPE II (11/29/2006), Dizziness and giddiness (04/26/2007), HYPERLIPIDEMIA (01/07/2007), HYPERTENSION (07/08/2007), PLANTAR FASCIITIS, LEFT (08/05/2007), Preventative health care (07/30/2010), and Sleep apnea.  Diabetes Lab Results  Component Value Date   HGBA1C 6.7 (H) 10/11/2022   In  setting of continuing obeisty, pt to continue current medical treatment metformin 1000 bid, but change ozempic to starting dose mounjaro 2.5 mg weekly, and increase as directed   Essential hypertension BP Readings from Last 3 Encounters:   10/18/22 120/72  06/28/22 118/64  04/25/22 130/78   Stable, pt to continue medical treatment lisinopril 5 qd   Vitamin D deficiency Last vitamin D Lab Results  Component Value Date   VD25OH 40.94 10/11/2022   Stable, cont oral replacement   Left otitis media Mild to mod, for antibx course zpack asd,  to f/u any worsening symptoms or concerns  B12 deficiency Lab Results  Component Value Date   VITAMINB12 1,453 (H) 10/11/2022   Overcontrolled, to reduce oral replacement - b12 1000 mcg to mon wed friday  Followup: Return in about 6 months (around 04/18/2023).  Oliver Barre, MD 10/20/2022 7:03 PM [facility]

## 2022-10-20 ENCOUNTER — Encounter: Payer: Self-pay | Admitting: Internal Medicine

## 2022-10-20 NOTE — Assessment & Plan Note (Signed)
Last vitamin D Lab Results  Component Value Date   VD25OH 40.94 10/11/2022   Stable, cont oral replacement

## 2022-10-20 NOTE — Assessment & Plan Note (Signed)
Mild to mod, for antibx course zpack asd,  to f/u any worsening symptoms or concerns

## 2022-10-20 NOTE — Assessment & Plan Note (Signed)
Lab Results  Component Value Date   HGBA1C 6.7 (H) 10/11/2022   In setting of continuing obeisty, pt to continue current medical treatment metformin 1000 bid, but change ozempic to starting dose mounjaro 2.5 mg weekly, and increase as directed

## 2022-10-20 NOTE — Assessment & Plan Note (Signed)
BP Readings from Last 3 Encounters:  10/18/22 120/72  06/28/22 118/64  04/25/22 130/78   Stable, pt to continue medical treatment lisinopril 5 qd

## 2022-10-20 NOTE — Assessment & Plan Note (Signed)
Lab Results  Component Value Date   VITAMINB12 1,453 (H) 10/11/2022   Overcontrolled, to reduce oral replacement - b12 1000 mcg to mon wed friday

## 2022-10-23 ENCOUNTER — Encounter: Payer: Self-pay | Admitting: Internal Medicine

## 2022-10-23 MED ORDER — DOXYCYCLINE HYCLATE 100 MG PO TABS: 100.0000 mg | ORAL_TABLET | Freq: Two times a day (BID) | ORAL | 0 refills | Status: DC

## 2022-11-07 ENCOUNTER — Encounter: Payer: Self-pay | Admitting: Internal Medicine

## 2022-11-07 MED ORDER — TIRZEPATIDE 5 MG/0.5ML ~~LOC~~ SOAJ: 5.0000 mg | SUBCUTANEOUS | 3 refills | Status: DC

## 2022-11-07 NOTE — Addendum Note (Signed)
Addended by: Corwin Levins on: 11/07/2022 09:10 PM   Modules accepted: Orders

## 2022-11-08 MED ORDER — TIRZEPATIDE 7.5 MG/0.5ML ~~LOC~~ SOAJ: 7.5000 mg | SUBCUTANEOUS | 3 refills | Status: DC

## 2023-01-29 ENCOUNTER — Encounter: Payer: Self-pay | Admitting: Internal Medicine

## 2023-01-30 MED ORDER — TIRZEPATIDE 10 MG/0.5ML ~~LOC~~ SOAJ: 10.0000 mg | SUBCUTANEOUS | 11 refills | Status: DC

## 2023-02-12 ENCOUNTER — Ambulatory Visit (INDEPENDENT_AMBULATORY_CARE_PROVIDER_SITE_OTHER): Payer: 59

## 2023-02-12 ENCOUNTER — Ambulatory Visit: Payer: 59 | Admitting: Internal Medicine

## 2023-02-12 ENCOUNTER — Encounter: Payer: Self-pay | Admitting: Internal Medicine

## 2023-02-12 VITALS — BP 114/80 | HR 67 | Temp 98.0°F | Ht 62.0 in | Wt 179.0 lb

## 2023-02-12 DIAGNOSIS — J069 Acute upper respiratory infection, unspecified: Secondary | ICD-10-CM | POA: Diagnosis not present

## 2023-02-12 DIAGNOSIS — R051 Acute cough: Secondary | ICD-10-CM

## 2023-02-12 LAB — POC INFLUENZA A&B (BINAX/QUICKVUE)
Influenza A, POC: NEGATIVE
Influenza B, POC: NEGATIVE

## 2023-02-12 LAB — POC COVID19 BINAXNOW: SARS Coronavirus 2 Ag: NEGATIVE

## 2023-02-12 MED ORDER — HYDROCOD POLI-CHLORPHE POLI ER 10-8 MG/5ML PO SUER
5.0000 mL | Freq: Two times a day (BID) | ORAL | 0 refills | Status: DC | PRN
Start: 2023-02-12 — End: 2023-04-22

## 2023-02-12 NOTE — Patient Instructions (Addendum)
     Have a chest xray today downstairs.     Medications changes include :   tussionex cough syrup twice a day      Return if symptoms worsen or fail to improve.

## 2023-02-12 NOTE — Progress Notes (Signed)
 Subjective:    Patient ID: Traci [name], female    DOB: 01/21/1960, 63 y.o.   MRN: [id number]      HPI Traci [name] is here for  Chief Complaint  Patient presents with   Cough    Cough, congestion, headache, fever; this past Friday came back (started back during Christmas but got better) Feels like congestion has settled in chest, on Friday; Also notes pain in left ear.     She is here for an acute visit for cold symptoms.   Her symptoms started last Friday, 4 days ago.    She is experiencing fever up to 103, sweats, decreased appetite, nasal congestion, left ear pain, PND, sinus pressure, cough - clear mucus, and headaches.   She has tried taking tylenol, advil, delsym      Medications and allergies reviewed with patient and updated if appropriate.  Current Outpatient Medications on File Prior to Visit  Medication Sig Dispense Refill   ALPRAZolam  (XANAX ) 0.5 MG tablet 1-2 tab by mouth as needed with travel 8 tablet 0   aspirin 81 MG tablet Take 81 mg by mouth daily.     celecoxib  (CELEBREX ) 200 MG capsule Take 1 capsule (200 mg total) by mouth 2 (two) times daily as needed. 180 capsule 1   Cholecalciferol (D2000 ULTRA STRENGTH) 50 MCG (2000 UT) CAPS      citalopram  (CELEXA ) 20 MG tablet TAKE 1 TABLET BY MOUTH ONCE DAILY . 90 tablet 3   cyanocobalamin  (VITAMIN B12) 100 MCG tablet Take 100 mcg by mouth daily.     doxycycline  (VIBRA -TABS) 100 MG tablet Take 1 tablet (100 mg total) by mouth 2 (two) times daily. 20 tablet 0   glucose blood test strip Use as directed once daily to check blood sugar.  Diagnosis code 250.00 100 each 11   Lancets MISC Use as directed once daily to check blood sugar.  Diagnosis code 250.00 100 each 11   lisinopril  (ZESTRIL ) 5 MG tablet TAKE 1 TABLET BY MOUTH ONCE DAILY . APPOINTMENT REQUIRED FOR FUTURE REFILLS 90 tablet 3   metFORMIN  (GLUCOPHAGE ) 1000 MG tablet TAKE 1 TABLET BY MOUTH TWICE DAILY WITH A MEAL 180 tablet 3   pravastatin  (PRAVACHOL ) 40  MG tablet TAKE 1 TABLET BY MOUTH ONCE DAILY . APPOINTMENT REQUIRED FOR FUTURE REFILLS 90 tablet 3   scopolamine  (TRANSDERM-SCOP, 1.5 MG,) 1 MG/3DAYS Place 1 patch (1.5 mg total) onto the skin every 3 (three) days. 10 patch 12   tirzepatide  (MOUNJARO ) 10 MG/0.5ML Pen Inject 10 mg into the skin once a week. 6 mL 11   No current facility-administered medications on file prior to visit.    Review of Systems  Constitutional:  Positive for appetite change (decreased), diaphoresis and fever (103).  HENT:  Positive for congestion, ear pain (left - few sharp pains), postnasal drip and sinus pressure. Negative for sore throat.   Respiratory:  Positive for cough (clear mucus when she gets it up). Negative for shortness of breath and wheezing.   Gastrointestinal:  Positive for vomiting (once with high fever).  Musculoskeletal:  Negative for myalgias.  Neurological:  Positive for dizziness (with fever) and headaches. Negative for light-headedness.       Objective:   Vitals:   02/12/23 0937  BP: 114/80  Pulse: 67  Temp: 98 F (36.7 C)  SpO2: 95%   BP Readings from Last 3 Encounters:  02/12/23 114/80  10/18/22 120/72  06/28/22 118/64   Wt Readings from Last 3 Encounters:  02/12/23 179 lb (81.2 kg)  10/18/22 183 lb (83 kg)  06/28/22 188 lb 12.8 oz (85.6 kg)   Body mass index is 32.74 kg/m.    Physical Exam Constitutional:      General: She is not in acute distress.    Appearance: Normal appearance. She is not ill-appearing.  HENT:     Head: Normocephalic and atraumatic.     Right Ear: Tympanic membrane, ear canal and external ear normal.     Left Ear: Tympanic membrane, ear canal and external ear normal.     Mouth/Throat:     Mouth: Mucous membranes are moist.     Pharynx: No oropharyngeal exudate or posterior oropharyngeal erythema.  Eyes:     Conjunctiva/sclera: Conjunctivae normal.  Cardiovascular:     Rate and Rhythm: Normal rate and regular rhythm.  Pulmonary:      Effort: Pulmonary effort is normal. No respiratory distress.     Breath sounds: Rales (bibasilar crackles) present. No wheezing.  Musculoskeletal:     Cervical back: Neck supple. No tenderness.  Lymphadenopathy:     Cervical: No cervical adenopathy.  Skin:    General: Skin is warm and dry.  Neurological:     Mental Status: She is alert.        DG Chest 2 View CLINICAL DATA:  Cough and fever.  Congestion.  EXAM: CHEST - 2 VIEW  COMPARISON:  06/26/2021  FINDINGS: The lungs are clear without focal pneumonia, edema, pneumothorax or pleural effusion. The cardiopericardial silhouette is within normal limits for size. No acute bony abnormality. Nodular density adjacent to the right anterior first rib and is stable since prior, likely calcified costal cartilage.  IMPRESSION: No active cardiopulmonary disease.  Electronically Signed   By: Camellia Candle M.D.   On: 02/12/2023 11:11       Assessment & Plan:    URI: Acute Likely viral in nature COVID and flu test negative here Will get a chest x-ray today just to make sure there is not evidence of pneumonia or atypical pneumonia No need for an antibiotic Tussionex cough syrup prescribed Discussed symptomatic treatment Advised her to monitor symptoms closely and if her symptoms are worsening or if she has any questions she should call

## 2023-02-12 NOTE — Addendum Note (Signed)
Addended by: Karma Ganja on: 02/12/2023 02:33 PM   Modules accepted: Orders

## 2023-04-15 ENCOUNTER — Other Ambulatory Visit (INDEPENDENT_AMBULATORY_CARE_PROVIDER_SITE_OTHER)

## 2023-04-15 DIAGNOSIS — E559 Vitamin D deficiency, unspecified: Secondary | ICD-10-CM

## 2023-04-15 DIAGNOSIS — E1165 Type 2 diabetes mellitus with hyperglycemia: Secondary | ICD-10-CM

## 2023-04-15 DIAGNOSIS — E538 Deficiency of other specified B group vitamins: Secondary | ICD-10-CM | POA: Diagnosis not present

## 2023-04-15 LAB — MICROALBUMIN / CREATININE URINE RATIO
Creatinine,U: 70.9 mg/dL
Microalb Creat Ratio: UNDETERMINED mg/g (ref 0.0–30.0)
Microalb, Ur: <0.7 mg/dL

## 2023-04-15 LAB — CBC WITH DIFFERENTIAL/PLATELET
Basophils Absolute: 0 10*3/uL (ref 0.0–0.1)
Basophils Relative: 0.7 % (ref 0.0–3.0)
Eosinophils Absolute: 0.2 10*3/uL (ref 0.0–0.7)
Eosinophils Relative: 2.9 % (ref 0.0–5.0)
HCT: 39.4 % (ref 36.0–46.0)
Hemoglobin: 13.3 g/dL (ref 12.0–15.0)
Lymphocytes Relative: 30.6 % (ref 12.0–46.0)
Lymphs Abs: 2.1 10*3/uL (ref 0.7–4.0)
MCHC: 33.7 g/dL (ref 30.0–36.0)
MCV: 88.3 fl (ref 78.0–100.0)
Monocytes Absolute: 0.4 10*3/uL (ref 0.1–1.0)
Monocytes Relative: 5.7 % (ref 3.0–12.0)
Neutro Abs: 4.1 10*3/uL (ref 1.4–7.7)
Neutrophils Relative %: 60.1 % (ref 43.0–77.0)
Platelets: 258 10*3/uL (ref 150.0–400.0)
RBC: 4.46 Mil/uL (ref 3.87–5.11)
RDW: 13.7 % (ref 11.5–15.5)
WBC: 6.8 10*3/uL (ref 4.0–10.5)

## 2023-04-15 LAB — LIPID PANEL
Cholesterol: 119 mg/dL (ref 0–200)
HDL: 43.6 mg/dL (ref >39.00)
LDL Cholesterol: 60 mg/dL (ref 0–99)
NonHDL: 75.3
Total CHOL/HDL Ratio: 3
Triglycerides: 76 mg/dL (ref 0.0–149.0)
VLDL: 15.2 mg/dL (ref 0.0–40.0)

## 2023-04-15 LAB — URINALYSIS, ROUTINE W REFLEX MICROSCOPIC
Bilirubin Urine: NEGATIVE
Hgb urine dipstick: NEGATIVE
Ketones, ur: NEGATIVE
Nitrite: NEGATIVE
RBC / HPF: NONE SEEN (ref 0–?)
Specific Gravity, Urine: 1.025 (ref 1.000–1.030)
Total Protein, Urine: NEGATIVE
Urine Glucose: NEGATIVE
Urobilinogen, UA: 0.2 (ref 0.0–1.0)
pH: 6 (ref 5.0–8.0)

## 2023-04-15 LAB — HEPATIC FUNCTION PANEL
ALT: 18 U/L (ref 0–35)
AST: 16 U/L (ref 0–37)
Albumin: 4.2 g/dL (ref 3.5–5.2)
Alkaline Phosphatase: 44 U/L (ref 39–117)
Bilirubin, Direct: 0.1 mg/dL (ref 0.0–0.3)
Total Bilirubin: 0.4 mg/dL (ref 0.2–1.2)
Total Protein: 6.7 g/dL (ref 6.0–8.3)

## 2023-04-15 LAB — VITAMIN D 25 HYDROXY (VIT D DEFICIENCY, FRACTURES): VITD: 43.7 ng/mL (ref 30.00–100.00)

## 2023-04-15 LAB — BASIC METABOLIC PANEL WITH GFR
BUN: 15 mg/dL (ref 6–23)
CO2: 26 meq/L (ref 19–32)
Calcium: 9 mg/dL (ref 8.4–10.5)
Chloride: 104 meq/L (ref 96–112)
Creatinine, Ser: 0.61 mg/dL (ref 0.40–1.20)
GFR: 96.11 mL/min (ref >60.00)
Glucose, Bld: 115 mg/dL — ABNORMAL HIGH (ref 70–99)
Potassium: 4 meq/L (ref 3.5–5.1)
Sodium: 139 meq/L (ref 135–145)

## 2023-04-15 LAB — VITAMIN B12: Vitamin B-12: 947 pg/mL — ABNORMAL HIGH (ref 211–911)

## 2023-04-15 LAB — HEMOGLOBIN A1C: Hgb A1c MFr Bld: 6 % (ref 4.6–6.5)

## 2023-04-15 LAB — TSH: TSH: 1.49 u[IU]/mL (ref 0.35–5.50)

## 2023-04-19 ENCOUNTER — Ambulatory Visit: Payer: 59 | Admitting: Internal Medicine

## 2023-04-22 ENCOUNTER — Ambulatory Visit: Payer: 59 | Admitting: Internal Medicine

## 2023-04-22 ENCOUNTER — Encounter: Payer: Self-pay | Admitting: Internal Medicine

## 2023-04-22 VITALS — BP 126/70 | HR 75 | Temp 98.0°F | Ht 62.0 in | Wt 173.0 lb

## 2023-04-22 DIAGNOSIS — Z1211 Encounter for screening for malignant neoplasm of colon: Secondary | ICD-10-CM

## 2023-04-22 DIAGNOSIS — I10 Essential (primary) hypertension: Secondary | ICD-10-CM

## 2023-04-22 DIAGNOSIS — E538 Deficiency of other specified B group vitamins: Secondary | ICD-10-CM

## 2023-04-22 DIAGNOSIS — E1165 Type 2 diabetes mellitus with hyperglycemia: Secondary | ICD-10-CM | POA: Diagnosis not present

## 2023-04-22 DIAGNOSIS — E559 Vitamin D deficiency, unspecified: Secondary | ICD-10-CM | POA: Diagnosis not present

## 2023-04-22 DIAGNOSIS — Z0001 Encounter for general adult medical examination with abnormal findings: Secondary | ICD-10-CM

## 2023-04-22 DIAGNOSIS — Z0000 Encounter for general adult medical examination without abnormal findings: Secondary | ICD-10-CM

## 2023-04-22 DIAGNOSIS — E7800 Pure hypercholesterolemia, unspecified: Secondary | ICD-10-CM | POA: Diagnosis not present

## 2023-04-22 DIAGNOSIS — Z7985 Long-term (current) use of injectable non-insulin antidiabetic drugs: Secondary | ICD-10-CM

## 2023-04-22 MED ORDER — TIRZEPATIDE 12.5 MG/0.5ML ~~LOC~~ SOAJ: 12.5000 mg | SUBCUTANEOUS | 3 refills | Status: DC

## 2023-04-22 NOTE — Progress Notes (Signed)
 Patient ID: Traci [name], female   DOB: [birth year], 63 y.o.   MRN: [id number]         Chief Complaint:: wellness exam and weight (And A1c)  , dm, htn, hld, low vit d       HPI:  Traci [name] is a 63 y.o. female here for wellness exam; due for colonoscopy, for shingrix at pharmacy, o/w up to date                        Also Pt denies chest pain, increased sob or doe, wheezing, orthopnea, PND, increased LE swelling, palpitations, dizziness or syncope.   Pt denies polydipsia, polyuria, or new focal neuro s/s.    Pt denies fever, wt loss, night sweats, loss of appetite, or other constitutional symptoms     Wt Readings from Last 3 Encounters:  04/22/23 173 lb (78.5 kg)  02/12/23 179 lb (81.2 kg)  10/18/22 183 lb (83 kg)   BP Readings from Last 3 Encounters:  04/22/23 126/70  02/12/23 114/80  10/18/22 120/72   Immunization History  Administered Date(s) Administered   Hepatitis B 03/06/2017   Influenza Whole 01/07/2007, 01/11/2009, 02/07/2010   Influenza, Seasonal, Injecte, Preservative Fre 10/18/2022   Influenza,inj,Quad PF,6+ Mos 12/19/2012, 03/07/2017, 09/10/2017, 09/18/2018, 09/28/2020, 10/11/2021   PFIZER(Purple Top)SARS-COV-2 Vaccination 03/15/2019, 04/15/2019   Pneumococcal Conjugate-13 08/16/2015   Pneumococcal Polysaccharide-23 01/06/2008, 09/28/2020   Td 09/26/2007   Tdap 09/18/2018   Health Maintenance Due  Topic Date Due   Zoster Vaccines- Shingrix (1 of 2) Never done   Colonoscopy  02/05/2020   Cervical Cancer Screening (HPV/Pap Cotest)  11/08/2022      Past Medical History:  Diagnosis Date   ALLERGIC RHINITIS 07/07/2008   Allergy    Anxiety    CONTACT DERMATITIS 11/30/2009   resolved   Depression    DIABETES MELLITUS, TYPE II 11/29/2006   Dizziness and giddiness 04/26/2007   HYPERLIPIDEMIA 01/07/2007   HYPERTENSION 07/08/2007   PLANTAR FASCIITIS, LEFT 08/05/2007   resolved   Preventative health care 07/30/2010   Sleep apnea    CPAP nightly   Past  Surgical History:  Procedure Laterality Date   COLONOSCOPY  2016   kalplan - polyps   COLONOSCOPY  01/2019   MOUTH SURGERY     teeth extraction   TUBAL LIGATION     WISDOM TOOTH EXTRACTION      reports that she has never smoked. She has never used smokeless tobacco. She reports that she does not currently use alcohol. She reports that she does not use drugs. family history includes Bipolar disorder in her maternal aunt; Cancer in her father, maternal aunt, and mother; Depression in her mother; Diabetes in her father, maternal aunt, maternal uncle, paternal aunt, and paternal uncle; Fibroids in her maternal aunt and paternal aunt; Glaucoma in her mother; Heart disease in her father and mother; Hyperlipidemia in her father, mother, and paternal aunt; Hypertension in her father, maternal uncle, mother, and paternal aunt; Raynaud syndrome in her mother; Stroke in her father, mother, and paternal uncle; Thrombophilia in her father and paternal uncle; Thyroid disease in her paternal aunt; Varicose Veins in her mother. Allergies  Allergen Reactions   Penicillins     REACTION: Rash   Current Outpatient Medications on File Prior to Visit  Medication Sig Dispense Refill   ALPRAZolam (XANAX) 0.5 MG tablet 1-2 tab by mouth as needed with travel 8 tablet 0   aspirin 81 MG tablet Take  81 mg by mouth daily.     celecoxib (CELEBREX) 200 MG capsule Take 1 capsule (200 mg total) by mouth 2 (two) times daily as needed. 180 capsule 1   Cholecalciferol (D2000 ULTRA STRENGTH) 50 MCG (2000 UT) CAPS      citalopram (CELEXA) 20 MG tablet TAKE 1 TABLET BY MOUTH ONCE DAILY . 90 tablet 3   cyanocobalamin (VITAMIN B12) 100 MCG tablet Take 100 mcg by mouth daily.     glucose blood test strip Use as directed once daily to check blood sugar.  Diagnosis code 250.00 100 each 11   Lancets MISC Use as directed once daily to check blood sugar.  Diagnosis code 250.00 100 each 11   lisinopril (ZESTRIL) 5 MG tablet TAKE 1 TABLET  BY MOUTH ONCE DAILY . APPOINTMENT REQUIRED FOR FUTURE REFILLS 90 tablet 3   pravastatin (PRAVACHOL) 40 MG tablet TAKE 1 TABLET BY MOUTH ONCE DAILY . APPOINTMENT REQUIRED FOR FUTURE REFILLS 90 tablet 3   scopolamine (TRANSDERM-SCOP, 1.5 MG,) 1 MG/3DAYS Place 1 patch (1.5 mg total) onto the skin every 3 (three) days. 10 patch 12   No current facility-administered medications on file prior to visit.        ROS:  All others reviewed and negative.  Objective        PE:  BP 126/70 (BP Location: Left Arm, Patient Position: Sitting, Cuff Size: Normal)   Pulse 75   Temp 98 F (36.7 C) (Oral)   Ht 5\' 2"  (1.575 m)   Wt 173 lb (78.5 kg)   LMP  (LMP Unknown)   SpO2 98%   BMI 31.64 kg/m                 Constitutional: Pt appears in NAD               HENT: Head: NCAT.                Right Ear: External ear normal.                 Left Ear: External ear normal.                Eyes: . Pupils are equal, round, and reactive to light. Conjunctivae and EOM are normal               Nose: without d/c or deformity               Neck: Neck supple. Gross normal ROM               Cardiovascular: Normal rate and regular rhythm.                 Pulmonary/Chest: Effort normal and breath sounds without rales or wheezing.                Abd:  Soft, NT, ND, + BS, no organomegaly               Neurological: Pt is alert. At baseline orientation, motor grossly intact               Skin: Skin is warm. No rashes, no other new lesions, LE edema - none               Psychiatric: Pt behavior is normal without agitation   Micro: none  Cardiac tracings I have personally interpreted today:  none  Pertinent Radiological findings (summarize): none   Lab Results  Component Value Date  WBC 6.8 04/15/2023   HGB 13.3 04/15/2023   HCT 39.4 04/15/2023   PLT 258.0 04/15/2023   GLUCOSE 115 (H) 04/15/2023   CHOL 119 04/15/2023   TRIG 76.0 04/15/2023   HDL 43.60 04/15/2023   LDLCALC 60 04/15/2023   ALT 18 04/15/2023    AST 16 04/15/2023   NA 139 04/15/2023   K 4.0 04/15/2023   CL 104 04/15/2023   CREATININE 0.61 04/15/2023   BUN 15 04/15/2023   CO2 26 04/15/2023   TSH 1.49 04/15/2023   INR 1.0 09/23/2020   HGBA1C 6.0 04/15/2023   MICROALBUR <0.7 04/15/2023   Assessment/Plan:  MADA SADIK is a 63 y.o. White or Caucasian [1] female with  has a past medical history of ALLERGIC RHINITIS (07/07/2008), Allergy, Anxiety, CONTACT DERMATITIS (11/30/2009), Depression, DIABETES MELLITUS, TYPE II (11/29/2006), Dizziness and giddiness (04/26/2007), HYPERLIPIDEMIA (01/07/2007), HYPERTENSION (07/08/2007), PLANTAR FASCIITIS, LEFT (08/05/2007), Preventative health care (07/30/2010), and Sleep apnea.  Encounter for well adult exam with abnormal findings Age and sex appropriate education and counseling updated with regular exercise and diet Referrals for preventative services - for colonoscopy Immunizations addressed - for shingrix at pharmacy Smoking counseling  - none needed Evidence for depression or other mood disorder - none significant Most recent labs reviewed. I have personally reviewed and have noted: 1) the patient's medical and social history 2) The patient's current medications and supplements 3) The patient's height, weight, and BMI have been recorded in the chart   Diabetes Lab Results  Component Value Date   HGBA1C 6.0 04/15/2023   Improved with uncontrolled but improving obesity, pt for increased mounjaro 12.5 mg weekly and d/c metformin   Hyperlipidemia Lab Results  Component Value Date   LDLCALC 60 04/15/2023   Stable, pt to continue current statin pravachol 40 qd   Essential hypertension BP Readings from Last 3 Encounters:  04/22/23 126/70  02/12/23 114/80  10/18/22 120/72   Stable, pt to continue medical treatment lisnopril 5 mg qd   Vitamin D deficiency Last vitamin D Lab Results  Component Value Date   VD25OH 43.70 04/15/2023   Stable, cont oral replacement   B12  deficiency Lab Results  Component Value Date   VITAMINB12 947 (H) 04/15/2023   Stable, cont oral replacement - b12 1000 mcg qd  Followup: Return in about 6 months (around 10/22/2023).  Rosalia Colonel, MD 04/22/2023 8:27 PM [facility]

## 2023-04-22 NOTE — Assessment & Plan Note (Signed)
 Lab Results  Component Value Date   LDLCALC 60 04/15/2023   Stable, pt to continue current statin pravachol 40 qd

## 2023-04-22 NOTE — Assessment & Plan Note (Signed)
 Lab Results  Component Value Date   HGBA1C 6.0 04/15/2023   Improved with uncontrolled but improving obesity, pt for increased mounjaro 12.5 mg weekly and d/c metformin

## 2023-04-22 NOTE — Assessment & Plan Note (Signed)
 BP Readings from Last 3 Encounters:  04/22/23 126/70  02/12/23 114/80  10/18/22 120/72   Stable, pt to continue medical treatment lisnopril 5 mg qd

## 2023-04-22 NOTE — Assessment & Plan Note (Addendum)
Age and sex appropriate education and counseling updated with regular exercise and diet Referrals for preventative services - for colonoscopy Immunizations addressed - for shingrix at pharmacy Smoking counseling  - none needed Evidence for depression or other mood disorder - none significant Most recent labs reviewed. I have personally reviewed and have noted: 1) the patient's medical and social history 2) The patient's current medications and supplements 3) The patient's height, weight, and BMI have been recorded in the chart

## 2023-04-22 NOTE — Assessment & Plan Note (Signed)
 Lab Results  Component Value Date   VITAMINB12 947 (H) 04/15/2023   Stable, cont oral replacement - b12 1000 mcg qd

## 2023-04-22 NOTE — Assessment & Plan Note (Signed)
 Last vitamin D Lab Results  Component Value Date   VD25OH 43.70 04/15/2023   Stable, cont oral replacement

## 2023-04-22 NOTE — Patient Instructions (Signed)
 Ok to stop the metformin  Ok to increase the Mounjaro to 12.5 mg weekly  Please continue all other medications as before, and refills have been done if requested.  Please have the pharmacy call with any other refills you may need.  Please continue your efforts at being more active, low cholesterol diet, and weight control.  You are otherwise up to date with prevention measures today.  Please keep your appointments with your specialists as you may have planned  You will be contacted regarding the referral for: colonoscopy  Please make an Appointment to return in 6 months, or sooner if needed, also with Lab Appointment for testing done 3-5 days before at the FIRST FLOOR Lab (so this is for TWO appointments - please see the scheduling desk as you leave)

## 2023-05-08 ENCOUNTER — Encounter: Payer: Self-pay | Admitting: Gastroenterology

## 2023-06-13 ENCOUNTER — Ambulatory Visit (AMBULATORY_SURGERY_CENTER)

## 2023-06-13 VITALS — Ht 62.0 in | Wt 170.0 lb

## 2023-06-13 DIAGNOSIS — Z860100 Personal history of colon polyps, unspecified: Secondary | ICD-10-CM

## 2023-06-13 MED ORDER — NA SULFATE-K SULFATE-MG SULF 17.5-3.13-1.6 GM/177ML PO SOLN: 1.0000 | Freq: Once | ORAL | 0 refills | Status: AC

## 2023-06-13 NOTE — Patient Instructions (Signed)
 ONCE A WEEK INJECTIONS Ozempic ,  Mounjaro , Wegovy , Trulicity , Tanzeum, Byetta, Victoza, Bydureon, & SymlinPen  -DO NOT TAKE 7 days prior to the procedure.  Last dose on or before Wednesday 6/11 failure to hold this medication will result in a cancellation or rescheduling of your procedure

## 2023-06-13 NOTE — Progress Notes (Signed)

## 2023-06-17 ENCOUNTER — Encounter: Payer: Self-pay | Admitting: Gastroenterology

## 2023-06-27 ENCOUNTER — Ambulatory Visit: Admitting: Gastroenterology

## 2023-06-27 ENCOUNTER — Encounter: Payer: Self-pay | Admitting: Gastroenterology

## 2023-06-27 VITALS — BP 130/77 | HR 64 | Temp 97.8°F | Resp 12 | Ht 62.0 in | Wt 170.0 lb

## 2023-06-27 DIAGNOSIS — K648 Other hemorrhoids: Secondary | ICD-10-CM | POA: Diagnosis not present

## 2023-06-27 DIAGNOSIS — D123 Benign neoplasm of transverse colon: Secondary | ICD-10-CM | POA: Diagnosis not present

## 2023-06-27 DIAGNOSIS — K5730 Diverticulosis of large intestine without perforation or abscess without bleeding: Secondary | ICD-10-CM | POA: Diagnosis not present

## 2023-06-27 DIAGNOSIS — Z860100 Personal history of colon polyps, unspecified: Secondary | ICD-10-CM

## 2023-06-27 DIAGNOSIS — Z1211 Encounter for screening for malignant neoplasm of colon: Secondary | ICD-10-CM

## 2023-06-27 DIAGNOSIS — D120 Benign neoplasm of cecum: Secondary | ICD-10-CM

## 2023-06-27 MED ORDER — SODIUM CHLORIDE 0.9 % IV SOLN: 500.0000 mL | Freq: Once | INTRAVENOUS | Status: DC

## 2023-06-27 NOTE — Progress Notes (Signed)
 Pt's states no medical or surgical changes since previsit or office visit.

## 2023-06-27 NOTE — Patient Instructions (Addendum)
 Resume previous diet and medications.  Biopsy results will be sent via MyChart or letter.  Handouts provided on polyps and diverticulosis.  Repeat colonoscopy in 3 years.    YOU HAD AN ENDOSCOPIC PROCEDURE TODAY AT THE [facility]:   Refer to the procedure report that was given to you for any specific questions about what was found during the examination.  If the procedure report does not answer your questions, please call your gastroenterologist to clarify.  If you requested that your care partner not be given the details of your procedure findings, then the procedure report has been included in a sealed envelope for you to review at your convenience later.  YOU SHOULD EXPECT: Some feelings of bloating in the abdomen. Passage of more gas than usual.  Walking can help get rid of the air that was put into your GI tract during the procedure and reduce the bloating. If you had a lower endoscopy (such as a colonoscopy or flexible sigmoidoscopy) you may notice spotting of blood in your stool or on the toilet paper. If you underwent a bowel prep for your procedure, you may not have a normal bowel movement for a few days.  Please Note:  You might notice some irritation and congestion in your nose or some drainage.  This is from the oxygen used during your procedure.  There is no need for concern and it should clear up in a day or so.  SYMPTOMS TO REPORT IMMEDIATELY:  Following lower endoscopy (colonoscopy or flexible sigmoidoscopy):  Excessive amounts of blood in the stool  Significant tenderness or worsening of abdominal pains  Swelling of the abdomen that is new, acute  Fever of 100F or higher  For urgent or emergent issues, a gastroenterologist can be reached at any hour by calling [phone]. Do not use MyChart messaging for urgent concerns.   DIET:  We do recommend a small meal at first, but then you may proceed to your regular diet.  Drink plenty of fluids but you should avoid  alcoholic beverages for 24 hours.  ACTIVITY:  You should plan to take it easy for the rest of today and you should NOT DRIVE or use heavy machinery until tomorrow (because of the sedation medicines used during the test).    FOLLOW UP: Our staff will call the number listed on your records the next business day following your procedure.  We will call around 7:15- 8:00 am to check on you and address any questions or concerns that you may have regarding the information given to you following your procedure. If we do not reach you, we will leave a message.     If any biopsies were taken you will be contacted by phone or by letter within the next 1-3 weeks.  Please call us  at [phone] if you have not heard about the biopsies in 3 weeks.   SIGNATURES/CONFIDENTIALITY: You and/or your care partner have signed paperwork which will be entered into your electronic medical record.  These signatures attest to the fact that that the information above on your After Visit Summary has been reviewed and is understood.  Full responsibility of the confidentiality of this discharge information lies with you and/or your care-partner.

## 2023-06-27 NOTE — Op Note (Signed)
  [facility] Patient Name: Traci [name] Procedure Date: 06/27/2023 7:18 AM MRN: [id number] Endoscopist: Ace Abu L. Dominic Friendly , MD, [id number] Age: 63 Referring MD:  Date of Birth: May 10, 1960 Gender: Female Account #: [id number] Procedure:                Colonoscopy Indications:              Surveillance: Personal history of adenomatous                            polyps on last colonoscopy > 3 years ago                           2 cm cecal tubular adenoma removed by EMR in 2016                           January 2021-cecal tubular adenoma and ascending                            colon tubular adenoma removed piecemeal -1 year                            recall recommended Medicines:                Monitored Anesthesia Care Procedure:                Pre-Anesthesia Assessment:                           - Prior to the procedure, a History and Physical                            was performed, and patient medications and                            allergies were reviewed. The patient's tolerance of                            previous anesthesia was also reviewed. The risks                            and benefits of the procedure and the sedation                            options and risks were discussed with the patient.                            All questions were answered, and informed consent                            was obtained. Prior Anticoagulants: The patient has                            taken no anticoagulant or antiplatelet agents. ASA  Grade Assessment: II - A patient with mild systemic                            disease. After reviewing the risks and benefits,                            the patient was deemed in satisfactory condition to                            undergo the procedure.                           After obtaining informed consent, the colonoscope                            was passed under direct vision. Throughout the                             procedure, the patient's blood pressure, pulse, and                            oxygen saturations were monitored continuously. The                            Olympus Scope SN: [id number] was introduced through                            the anus and advanced to the the cecum, identified                            by appendiceal orifice and ileocecal valve. The                            colonoscopy was performed without difficulty. The                            patient tolerated the procedure well. The quality                            of the bowel preparation was excellent. The                            ileocecal valve, appendiceal orifice, and rectum                            were photographed. Scope In: 8:07:44 AM Scope Out: 8:27:45 AM Scope Withdrawal Time: 0 hours 18 minutes 21 seconds  Total Procedure Duration: 0 hours 20 minutes 1 second  Findings:                 The perianal and digital rectal examinations were                            normal.  Repeat examination of right colon under NBI                            performed.                           A 10 mm polyp was found in the cecum. The polyp was                            sessile and appeared to be overlying a scar from                            prior polypectomy. The polyp was removed with a hot                            snare. Resection and retrieval were complete.                            (Polyp removed en bloc, then cut with cold snare                            for retrieval)                           STSC applied to polypectomy edges                           A diminutive polyp was found in the distal                            transverse colon. The polyp was flat. The polyp was                            removed with a cold snare. Resection and retrieval                            were complete.                           Diverticula were found in the left colon.                            Internal hemorrhoids were found.                           The exam was otherwise without abnormality on                            direct and retroflexion views. Complications:            No immediate complications. Estimated Blood Loss:     Estimated blood loss was minimal. Impression:               - One 10 mm polyp in the cecum, removed with a hot  snare. Resected and retrieved.                           - One diminutive polyp in the distal transverse                            colon, removed with a cold snare. Resected and                            retrieved.                           - Diverticulosis in the left colon.                           - Internal hemorrhoids.                           - The examination was otherwise normal on direct                            and retroflexion views. Recommendation:           - Patient has a contact number available for                            emergencies. The signs and symptoms of potential                            delayed complications were discussed with the                            patient. Return to normal activities tomorrow.                            Written discharge instructions were provided to the                            patient.                           - Resume previous diet.                           - Continue present medications.                           - Await pathology results.                           - Repeat colonoscopy in 3 years for surveillance. Elara Cocke L. Dominic Friendly, MD 06/27/2023 8:33:45 AM This report has been signed electronically.

## 2023-06-27 NOTE — Progress Notes (Signed)
 History and Physical:  This patient presents for endoscopic testing for: Encounter Diagnosis  Name Primary?   Hx of colonic polyps Yes    63 year old woman here today for surveillance colonoscopy. History of colon polyps-January 2021 cecal tubular adenoma and an ascending colon tubular adenoma removed hot snare in piecemeal.  1 year recall recommended  2 cm cecal tubular adenoma removed by EMR in 2016  Patient denies chronic abdominal pain, rectal bleeding, constipation or diarrhea.   Patient is otherwise without complaints or active issues today.   Past Medical History: Past Medical History:  Diagnosis Date   ALLERGIC RHINITIS 07/07/2008   Allergy    Anxiety    CONTACT DERMATITIS 11/30/2009   resolved   Depression    DIABETES MELLITUS, TYPE II 11/29/2006   Dizziness and giddiness 04/26/2007   HYPERLIPIDEMIA 01/07/2007   HYPERTENSION 07/08/2007   PLANTAR FASCIITIS, LEFT 08/05/2007   resolved   Preventative health care 07/30/2010   Sleep apnea    CPAP nightly     Past Surgical History: Past Surgical History:  Procedure Laterality Date   COLONOSCOPY  2016   kalplan - polyps   COLONOSCOPY  01/2019   MOUTH SURGERY     teeth extraction   TUBAL LIGATION     WISDOM TOOTH EXTRACTION      Allergies: Allergies  Allergen Reactions   Penicillins Rash    REACTION: Rash    Outpatient Meds: Current Outpatient Medications  Medication Sig Dispense Refill   aspirin 81 MG tablet Take 81 mg by mouth daily.     celecoxib  (CELEBREX ) 200 MG capsule Take 1 capsule (200 mg total) by mouth 2 (two) times daily as needed. 180 capsule 1   Cholecalciferol (D2000 ULTRA STRENGTH) 50 MCG (2000 UT) CAPS      citalopram  (CELEXA ) 20 MG tablet TAKE 1 TABLET BY MOUTH ONCE DAILY . 90 tablet 3   glucose blood test strip Use as directed once daily to check blood sugar.  Diagnosis code 250.00 100 each 11   Lancets MISC Use as directed once daily to check blood sugar.  Diagnosis code 250.00  100 each 11   lisinopril  (ZESTRIL ) 5 MG tablet TAKE 1 TABLET BY MOUTH ONCE DAILY . APPOINTMENT REQUIRED FOR FUTURE REFILLS 90 tablet 3   pravastatin  (PRAVACHOL ) 40 MG tablet TAKE 1 TABLET BY MOUTH ONCE DAILY . APPOINTMENT REQUIRED FOR FUTURE REFILLS 90 tablet 3   ALPRAZolam  (XANAX ) 0.5 MG tablet 1-2 tab by mouth as needed with travel 8 tablet 0   cyanocobalamin  (VITAMIN B12) 100 MCG tablet Take 100 mcg by mouth daily.     scopolamine  (TRANSDERM-SCOP, 1.5 MG,) 1 MG/3DAYS Place 1 patch (1.5 mg total) onto the skin every 3 (three) days. 10 patch 12   tirzepatide  (MOUNJARO ) 12.5 MG/0.5ML Pen Inject 12.5 mg into the skin once a week. 6 mL 3   Current Facility-Administered Medications  Medication Dose Route Frequency Provider Last Rate Last Admin   0.9 %  sodium chloride  infusion  500 mL Intravenous Once Danis, Devyn Sheerin L III, MD          ___________________________________________________________________ Objective   Exam:  BP 137/81   Pulse 72   Temp 97.8 F (36.6 C)   Resp 14   Ht 5' 2 (1.575 m)   Wt 170 lb (77.1 kg)   LMP  (LMP Unknown)   SpO2 98%   BMI 31.09 kg/m   CV: regular , S1/S2 Resp: clear to auscultation bilaterally, normal RR and effort noted GI:  soft, no tenderness, with active bowel sounds.   Assessment: Encounter Diagnosis  Name Primary?   Hx of colonic polyps Yes     Plan: Colonoscopy   The benefits and risks of the planned procedure(s) were described in detail with the patient or (when appropriate) their health care proxy.  Risks were outlined as including, but not limited to, bleeding, infection, perforation, adverse medication reaction leading to cardiac or pulmonary decompensation, pancreatitis (if ERCP).  The limitation of incomplete mucosal visualization was also discussed.  No guarantees or warranties were given.  The patient is appropriate for an endoscopic procedure in the ambulatory setting.   - Lorella Roles, MD

## 2023-06-27 NOTE — Progress Notes (Signed)
 Report given to PACU, vss

## 2023-06-28 ENCOUNTER — Telehealth: Payer: Self-pay | Admitting: *Deleted

## 2023-06-28 NOTE — Telephone Encounter (Signed)
  Follow up Call-     06/27/2023    7:19 AM  Call back number  Post procedure Call Back phone  # [phone]  Permission to leave phone message Yes    No answer and voicemail didn't come on.

## 2023-07-01 ENCOUNTER — Ambulatory Visit: Payer: 59 | Admitting: Internal Medicine

## 2023-07-02 ENCOUNTER — Ambulatory Visit: Payer: Self-pay | Admitting: Gastroenterology

## 2023-07-02 LAB — SURGICAL PATHOLOGY

## 2023-07-07 NOTE — Progress Notes (Signed)
 HPI F never smoker followed for OSA, complicated by HBP, Allergic Rhinitis, DM, Hyperlipidemia HST 09/11/2018- AHI 9.8/ hr, desaturation to 82%, body weight 201 lbs  ---------------------------------------------------------------------------   06/28/22- 61 yoF never smoker followed for OSA, Small Lung Nodules, complicated by HTN,  Allergic Rhinitis, DM2 Hyperlipidemia,  CPAP auto 5-15/ [employer] Body weight today 188 lbs Download compliance- 87%, AHI 0.9/ hr Download reviewed. Recent dry mouth using nasal pillows mask.  We discussed effective air conditioning.  Sore shoulder if she lies on her back.  Comfort issues reviewed. Will try reducing airflow by reducing AutoPap range to 5-10. CXR 06/26/21- IMPRESSION: No active cardiopulmonary disease. The previously noted nodules on cardiac CT are not well appreciated on chest x-ray.  07/09/23- 61 yoF never smoker followed for OSA, Small Lung Nodules, complicated by HTN,  Allergic Rhinitis, DM2 Hyperlipidemia,  CPAP auto 5-15/ [employer] Body weight today 178 lbs Download compliance- 83%, AHI 0.4/hr ------Sleep is good.  CPAP working well.  Occasional dry mouth from CPAP. She does benefit from CPAP with better sleep. Discussed the use of AI scribe software for clinical note transcription with the patient, who gave verbal consent to proceed.  History of Present Illness   Traci [name] is a 63 year old female who presents with issues related to CPAP use and dry mouth.  She experiences dry mouth, which she attributes to her CPAP machine use with nasal pillows. She is concerned about the comfort of using tape to keep her mouth closed during sleep. There are no issues with breathing lower down in her chest.     CXR 02/12/23- IMPRESSION: No active cardiopulmonary disease.  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 - Adjust CPAP humidifier to increase moisture, monitor for  hose condensation. - Use Biotene mouthwash before bed. - Try Xylemeltz lozenges for oral moisture. - Consider SnorTape to keep mouth closed, ensure comfort and safety. - Discuss full face or hybrid mask to prevent mouth breathing.     ROS-see HPI   + = positive Constitutional:    weight loss, night sweats, fevers, chills, fatigue, lassitude. HEENT:    headaches, difficulty swallowing, tooth/dental problems, sore throat,       sneezing, itching, ear ache, nasal congestion, post nasal drip, snoring CV:    chest pain, orthopnea, PND, +swelling in lower extremities, anasarca,                                   dizziness, palpitations Resp:   shortness of breath with exertion or at rest.                productive cough,   non-productive cough, coughing up of blood.              change in color of mucus.  wheezing.   Skin:    rash or lesions. GI:  No-   heartburn, indigestion, abdominal pain, nausea, vomiting, diarrhea,                 change in bowel habits, loss of appetite GU: dysuria, change in color of urine, no urgency or frequency.   flank pain. MS:   joint pain, stiffness, decreased range of motion, back pain. Neuro-     nothing unusual Psych:  change in mood or affect.  depression or anxiety.   memory  loss.  OBJ- Physical Exam General- Alert, Oriented, Affect-appropriate, Distress- none acute, + overweight, ?almost Cushingoid-thick neck Skin- rash-none, lesions- none, excoriation- none Lymphadenopathy- none Head- atraumatic            Eyes- Gross vision intact, PERRLA, conjunctivae and secretions clear            Ears- Hearing, canals-normal            Nose- Clear, no-Septal dev, mucus, polyps, erosion, perforation             Throat- Mallampati IV , mucosa clear , drainage- none, tonsils-, few missing teeth Neck- flexible , trachea midline, no stridor , thyroid  nl, carotid no bruit Chest - symmetrical excursion , unlabored           Heart/CV- RRR , no murmur , no gallop  , no  rub, nl s1 s2                           - JVD- none , edema- none, stasis changes- none, varices- none           Lung- clear to P&A, wheeze- none, cough- none , dullness-none, rub- none           Chest wall-  Abd-  Br/ Gen/ Rectal- Not done, not indicated Extrem- cyanosis- none, clubbing, none, atrophy- none, strength- nl Neuro- grossly intact to observation

## 2023-07-09 ENCOUNTER — Ambulatory Visit: Admitting: Internal Medicine

## 2023-07-09 ENCOUNTER — Encounter: Payer: Self-pay | Admitting: Internal Medicine

## 2023-07-09 VITALS — BP 108/60 | HR 73 | Temp 98.2°F | Ht 62.0 in | Wt 178.0 lb

## 2023-07-09 DIAGNOSIS — G4733 Obstructive sleep apnea (adult) (pediatric): Secondary | ICD-10-CM | POA: Diagnosis not present

## 2023-07-09 DIAGNOSIS — R682 Dry mouth, unspecified: Secondary | ICD-10-CM | POA: Diagnosis not present

## 2023-07-09 NOTE — Patient Instructions (Signed)
 You are doing well with CPAP and we can continue auto 5-10  For dry mouth: Otc Biotene mouth wash at bedtime Otc Xylimelts lozenges for dry mouth Adjust CPAP humidifier up a little Consider talking with your DME company about mask styles that cover the mouth, such as full-face or hybrid styles

## 2023-07-26 ENCOUNTER — Telehealth: Payer: Self-pay

## 2023-07-26 NOTE — Telephone Encounter (Signed)
 Pharmacy Patient Advocate Encounter   Received notification from CoverMyMeds that prior authorization for Ozempic  (2 MG/DOSE) 8MG /3ML pen-injectors is required/requested.   Insurance verification completed.   The patient is insured through [facility] .   Per test claim: PA required; PA submitted to above mentioned insurance via CoverMyMeds Key/confirmation #/EOC [id number] Status is pending

## 2023-08-01 ENCOUNTER — Other Ambulatory Visit (HOSPITAL_COMMUNITY): Payer: Self-pay

## 2023-08-01 NOTE — Telephone Encounter (Signed)
 Pharmacy Patient Advocate Encounter  Received notification from [employer] that Prior Authorization for Ozempic  (2 MG/DOSE) 8MG /3ML pen-injectors has been APPROVED from 07/26/2023 to 07/25/2024   PA #/Case ID/Reference #: [id number]

## 2023-08-05 ENCOUNTER — Encounter: Payer: Self-pay | Admitting: Internal Medicine

## 2023-08-29 LAB — HM MAMMOGRAPHY

## 2023-09-06 ENCOUNTER — Encounter: Payer: Self-pay | Admitting: Internal Medicine

## 2023-09-11 ENCOUNTER — Other Ambulatory Visit (HOSPITAL_COMMUNITY): Payer: Self-pay

## 2023-09-13 ENCOUNTER — Other Ambulatory Visit (HOSPITAL_COMMUNITY): Payer: Self-pay

## 2023-10-18 ENCOUNTER — Other Ambulatory Visit (INDEPENDENT_AMBULATORY_CARE_PROVIDER_SITE_OTHER)

## 2023-10-18 DIAGNOSIS — E1165 Type 2 diabetes mellitus with hyperglycemia: Secondary | ICD-10-CM | POA: Diagnosis not present

## 2023-10-18 DIAGNOSIS — E538 Deficiency of other specified B group vitamins: Secondary | ICD-10-CM

## 2023-10-18 DIAGNOSIS — E559 Vitamin D deficiency, unspecified: Secondary | ICD-10-CM | POA: Diagnosis not present

## 2023-10-18 LAB — BASIC METABOLIC PANEL WITH GFR
BUN: 12 mg/dL (ref 6–23)
CO2: 27 meq/L (ref 19–32)
Calcium: 8.8 mg/dL (ref 8.4–10.5)
Chloride: 105 meq/L (ref 96–112)
Creatinine, Ser: 0.7 mg/dL (ref 0.40–1.20)
GFR: 92.64 mL/min (ref >60.00)
Glucose, Bld: 161 mg/dL — ABNORMAL HIGH (ref 70–99)
Potassium: 4.4 meq/L (ref 3.5–5.1)
Sodium: 139 meq/L (ref 135–145)

## 2023-10-18 LAB — LIPID PANEL
Cholesterol: 130 mg/dL (ref 0–200)
HDL: 41.8 mg/dL (ref >39.00)
LDL Cholesterol: 65 mg/dL (ref 0–99)
NonHDL: 88.19
Total CHOL/HDL Ratio: 3
Triglycerides: 117 mg/dL (ref 0.0–149.0)
VLDL: 23.4 mg/dL (ref 0.0–40.0)

## 2023-10-18 LAB — HEPATIC FUNCTION PANEL
ALT: 25 U/L (ref 0–35)
AST: 23 U/L (ref 0–37)
Albumin: 4.2 g/dL (ref 3.5–5.2)
Alkaline Phosphatase: 55 U/L (ref 39–117)
Bilirubin, Direct: 0.1 mg/dL (ref 0.0–0.3)
Total Bilirubin: 0.4 mg/dL (ref 0.2–1.2)
Total Protein: 6.8 g/dL (ref 6.0–8.3)

## 2023-10-18 LAB — VITAMIN B12: Vitamin B-12: 1342 pg/mL — ABNORMAL HIGH (ref 211–911)

## 2023-10-18 LAB — HEMOGLOBIN A1C: Hgb A1c MFr Bld: 6.9 % — ABNORMAL HIGH (ref 4.6–6.5)

## 2023-10-18 LAB — VITAMIN D 25 HYDROXY (VIT D DEFICIENCY, FRACTURES): VITD: 38.09 ng/mL (ref 30.00–100.00)

## 2023-10-23 ENCOUNTER — Ambulatory Visit: Admitting: Internal Medicine

## 2023-10-23 VITALS — BP 122/76 | HR 74 | Temp 98.2°F | Ht 62.0 in | Wt 179.0 lb

## 2023-10-23 DIAGNOSIS — E1165 Type 2 diabetes mellitus with hyperglycemia: Secondary | ICD-10-CM

## 2023-10-23 DIAGNOSIS — Z7985 Long-term (current) use of injectable non-insulin antidiabetic drugs: Secondary | ICD-10-CM

## 2023-10-23 DIAGNOSIS — Z23 Encounter for immunization: Secondary | ICD-10-CM

## 2023-10-23 DIAGNOSIS — E538 Deficiency of other specified B group vitamins: Secondary | ICD-10-CM

## 2023-10-23 DIAGNOSIS — E7800 Pure hypercholesterolemia, unspecified: Secondary | ICD-10-CM

## 2023-10-23 DIAGNOSIS — Z7984 Long term (current) use of oral hypoglycemic drugs: Secondary | ICD-10-CM

## 2023-10-23 DIAGNOSIS — I10 Essential (primary) hypertension: Secondary | ICD-10-CM

## 2023-10-23 DIAGNOSIS — E559 Vitamin D deficiency, unspecified: Secondary | ICD-10-CM

## 2023-10-23 MED ORDER — TIRZEPATIDE 15 MG/0.5ML ~~LOC~~ SOAJ
15.0000 mg | SUBCUTANEOUS | 3 refills | Status: AC
Start: 2023-10-23 — End: ?

## 2023-10-23 MED ORDER — METFORMIN HCL ER 500 MG PO TB24: 500.0000 mg | ORAL_TABLET | Freq: Every day | ORAL | 3 refills | Status: AC

## 2023-10-23 NOTE — Patient Instructions (Addendum)
 Ok to restart the metformin  Er 500 mg - 4 tabs in AM  Ok to increase the mounjaro  to 15 mg weekly  Please continue all other medications as before, and refills have been done if requested.  Please have the pharmacy call with any other refills you may need.  Please continue your efforts at being more active, low cholesterol diet, and weight control.  Please keep your appointments with your specialists as you may have planned  Please make an Appointment to return in 6 months, or sooner if needed, also with Lab Appointment for testing done 3-5 days before at the FIRST FLOOR Lab (so this is for TWO appointments - please see the scheduling desk as you leave)

## 2023-10-23 NOTE — Assessment & Plan Note (Signed)
 BP Readings from Last 3 Encounters:  10/23/23 122/76  07/09/23 108/60  06/27/23 130/77   Stable, pt to continue medical treatment lisinopril  5 mg qd

## 2023-10-23 NOTE — Progress Notes (Signed)
 Patient ID: Traci [name], female   DOB: 12-04-1960, 63 y.o.   MRN: [id number]        Chief Complaint: follow up HTN, HLD and DM       HPI:  Traci [name] is a 63 y.o. female here overall doing ok.  Pt denies chest pain, increased sob or doe, wheezing, orthopnea, PND, increased LE swelling, palpitations, dizziness or syncope.   Pt denies polydipsia, polyuria, or new focal neuro s/s.   Pt denies fever, wt loss, night sweats, loss of appetite, or other constitutional symptoms   Peak wt has been > 200 in past years now improved with mounjaro .  Denies worsening reflux, abd pain, dysphagia, n/v, bowel change or blood. Wt Readings from Last 3 Encounters:  10/23/23 179 lb (81.2 kg)  07/09/23 178 lb (80.7 kg)  06/27/23 170 lb (77.1 kg)   BP Readings from Last 3 Encounters:  10/23/23 122/76  07/09/23 108/60  06/27/23 130/77         Past Medical History:  Diagnosis Date   ALLERGIC RHINITIS 07/07/2008   Allergy    Anxiety    CONTACT DERMATITIS 11/30/2009   resolved   Depression    DIABETES MELLITUS, TYPE II 11/29/2006   Dizziness and giddiness 04/26/2007   HYPERLIPIDEMIA 01/07/2007   HYPERTENSION 07/08/2007   PLANTAR FASCIITIS, LEFT 08/05/2007   resolved   Preventative health care 07/30/2010   Sleep apnea    CPAP nightly   Past Surgical History:  Procedure Laterality Date   COLONOSCOPY  2016   kalplan - polyps   COLONOSCOPY  01/2019   MOUTH SURGERY     teeth extraction   TUBAL LIGATION     WISDOM TOOTH EXTRACTION      reports that she has never smoked. She has been exposed to tobacco smoke. She has never used smokeless tobacco. She reports that she does not currently use alcohol. She reports that she does not use drugs. family history includes Bipolar disorder in her maternal aunt; Cancer in her father, maternal aunt, and mother; Depression in her mother; Diabetes in her father, maternal aunt, maternal uncle, paternal aunt, and paternal uncle; Fibroids in her maternal aunt and  paternal aunt; Glaucoma in her mother; Heart disease in her father and mother; Hyperlipidemia in her father, mother, and paternal aunt; Hypertension in her father, maternal uncle, mother, and paternal aunt; Raynaud syndrome in her mother; Stroke in her father, mother, and paternal uncle; Thrombophilia in her father and paternal uncle; Thyroid  disease in her paternal aunt; Varicose Veins in her mother. Allergies  Allergen Reactions   Penicillins Rash    REACTION: Rash   Current Outpatient Medications on File Prior to Visit  Medication Sig Dispense Refill   ALPRAZolam  (XANAX ) 0.5 MG tablet 1-2 tab by mouth as needed with travel 8 tablet 0   aspirin 81 MG tablet Take 81 mg by mouth daily.     celecoxib  (CELEBREX ) 200 MG capsule Take 1 capsule (200 mg total) by mouth 2 (two) times daily as needed. 180 capsule 1   Cholecalciferol (D2000 ULTRA STRENGTH) 50 MCG (2000 UT) CAPS      citalopram  (CELEXA ) 20 MG tablet TAKE 1 TABLET BY MOUTH ONCE DAILY . 90 tablet 3   cyanocobalamin  (VITAMIN B12) 100 MCG tablet Take 100 mcg by mouth daily.     glucose blood test strip Use as directed once daily to check blood sugar.  Diagnosis code 250.00 100 each 11   Lancets MISC Use as directed once  daily to check blood sugar.  Diagnosis code 250.00 100 each 11   lisinopril  (ZESTRIL ) 5 MG tablet TAKE 1 TABLET BY MOUTH ONCE DAILY . APPOINTMENT REQUIRED FOR FUTURE REFILLS 90 tablet 3   pravastatin  (PRAVACHOL ) 40 MG tablet TAKE 1 TABLET BY MOUTH ONCE DAILY . APPOINTMENT REQUIRED FOR FUTURE REFILLS 90 tablet 3   scopolamine  (TRANSDERM-SCOP, 1.5 MG,) 1 MG/3DAYS Place 1 patch (1.5 mg total) onto the skin every 3 (three) days. 10 patch 12   No current facility-administered medications on file prior to visit.        ROS:  All others reviewed and negative.  Objective        PE:  BP 122/76 (BP Location: Left Arm, Patient Position: Sitting, Cuff Size: Normal)   Pulse 74   Temp 98.2 F (36.8 C) (Oral)   Ht 5' 2 (1.575 m)    Wt 179 lb (81.2 kg)   LMP  (LMP Unknown)   SpO2 99%   BMI 32.74 kg/m                 Constitutional: Pt appears in NAD               HENT: Head: NCAT.                Right Ear: External ear normal.                 Left Ear: External ear normal.                Eyes: . Pupils are equal, round, and reactive to light. Conjunctivae and EOM are normal               Nose: without d/c or deformity               Neck: Neck supple. Gross normal ROM               Cardiovascular: Normal rate and regular rhythm.                 Pulmonary/Chest: Effort normal and breath sounds without rales or wheezing.                Abd:  Soft, NT, ND, + BS, no organomegaly               Neurological: Pt is alert. At baseline orientation, motor grossly intact               Skin: Skin is warm. No rashes, no other new lesions, LE edema - none               Psychiatric: Pt behavior is normal without agitation   Micro: none  Cardiac tracings I have personally interpreted today:  none  Pertinent Radiological findings (summarize): none   Lab Results  Component Value Date   WBC 6.8 04/15/2023   HGB 13.3 04/15/2023   HCT 39.4 04/15/2023   PLT 258.0 04/15/2023   GLUCOSE 161 (H) 10/18/2023   CHOL 130 10/18/2023   TRIG 117.0 10/18/2023   HDL 41.80 10/18/2023   LDLCALC 65 10/18/2023   ALT 25 10/18/2023   AST 23 10/18/2023   NA 139 10/18/2023   K 4.4 10/18/2023   CL 105 10/18/2023   CREATININE 0.70 10/18/2023   BUN 12 10/18/2023   CO2 27 10/18/2023   TSH 1.49 04/15/2023   INR 1.0 09/23/2020   HGBA1C 6.9 (H) 10/18/2023   MICROALBUR <0.7 04/15/2023  Assessment/Plan:  Traci [name] is a 63 y.o. White or Caucasian [1] female with  has a past medical history of ALLERGIC RHINITIS (07/07/2008), Allergy, Anxiety, CONTACT DERMATITIS (11/30/2009), Depression, DIABETES MELLITUS, TYPE II (11/29/2006), Dizziness and giddiness (04/26/2007), HYPERLIPIDEMIA (01/07/2007), HYPERTENSION (07/08/2007), PLANTAR FASCIITIS, LEFT  (08/05/2007), Preventative health care (07/30/2010), and Sleep apnea.  Diabetes (HCC) Lab Results  Component Value Date   HGBA1C 6.9 (H) 10/18/2023   Uncontrolled and mild worsening, pt for increased mounjaro  15 mg weekly, and restart metformin  ER 500 mg - 4 every day per pt request for better control   Essential hypertension BP Readings from Last 3 Encounters:  10/23/23 122/76  07/09/23 108/60  06/27/23 130/77   Stable, pt to continue medical treatment lisinopril  5 mg qd   Hyperlipidemia Lab Results  Component Value Date   LDLCALC 65 10/18/2023   Stable, pt to continue current statin pravachol  40 mg qd  Followup: Return in about 6 months (around 04/22/2024).  Lynwood Rush, MD 10/23/2023 7:51 PM [facility]

## 2023-10-23 NOTE — Assessment & Plan Note (Signed)
 Lab Results  Component Value Date   LDLCALC 65 10/18/2023   Stable, pt to continue current statin pravachol  40 mg qd

## 2023-10-23 NOTE — Assessment & Plan Note (Addendum)
 Lab Results  Component Value Date   HGBA1C 6.9 (H) 10/18/2023   Uncontrolled and mild worsening, pt for increased mounjaro  15 mg weekly, and restart metformin  ER 500 mg - 4 every day per pt request for better control

## 2023-11-30 ENCOUNTER — Other Ambulatory Visit: Payer: Self-pay | Admitting: Internal Medicine

## 2023-12-03 ENCOUNTER — Other Ambulatory Visit: Payer: Self-pay
